# Patient Record
Sex: Female | Born: 1960 | Race: White | Hispanic: No | Marital: Married | Smoking: Never smoker
Health system: Southern US, Community
[De-identification: ages and names within clinical notes are randomized; demographics above are authoritative.]

## PROBLEM LIST (undated history)

## (undated) DIAGNOSIS — N950 Postmenopausal bleeding: Secondary | ICD-10-CM

## (undated) DIAGNOSIS — R87629 Unspecified abnormal cytological findings in specimens from vagina: Secondary | ICD-10-CM

## (undated) DIAGNOSIS — I10 Essential (primary) hypertension: Secondary | ICD-10-CM

## (undated) DIAGNOSIS — F418 Other specified anxiety disorders: Secondary | ICD-10-CM

## (undated) DIAGNOSIS — F329 Major depressive disorder, single episode, unspecified: Secondary | ICD-10-CM

## (undated) DIAGNOSIS — E785 Hyperlipidemia, unspecified: Secondary | ICD-10-CM

## (undated) DIAGNOSIS — R51 Headache: Secondary | ICD-10-CM

## (undated) DIAGNOSIS — F32A Depression, unspecified: Secondary | ICD-10-CM

## (undated) DIAGNOSIS — F439 Reaction to severe stress, unspecified: Secondary | ICD-10-CM

## (undated) DIAGNOSIS — R519 Headache, unspecified: Secondary | ICD-10-CM

## (undated) DIAGNOSIS — G43909 Migraine, unspecified, not intractable, without status migrainosus: Secondary | ICD-10-CM

## (undated) DIAGNOSIS — J302 Other seasonal allergic rhinitis: Secondary | ICD-10-CM

## (undated) DIAGNOSIS — F419 Anxiety disorder, unspecified: Secondary | ICD-10-CM

## (undated) DIAGNOSIS — Z973 Presence of spectacles and contact lenses: Secondary | ICD-10-CM

## (undated) HISTORY — DX: Unspecified abnormal cytological findings in specimens from vagina: R87.629

## (undated) HISTORY — PX: DILATION AND CURETTAGE OF UTERUS: SHX78

## (undated) HISTORY — PX: OTHER SURGICAL HISTORY: SHX169

## (undated) HISTORY — DX: Other specified anxiety disorders: F41.8

## (undated) HISTORY — DX: Headache, unspecified: R51.9

## (undated) HISTORY — PX: EYE SURGERY: SHX253

## (undated) HISTORY — PX: CHOLECYSTECTOMY: SHX55

## (undated) HISTORY — DX: Headache: R51

## (undated) HISTORY — DX: Other seasonal allergic rhinitis: J30.2

## (undated) HISTORY — DX: Reaction to severe stress, unspecified: F43.9

---

## 1997-09-27 ENCOUNTER — Other Ambulatory Visit: Admission: RE | Admit: 1997-09-27 | Discharge: 1997-09-27 | Payer: Self-pay | Admitting: Obstetrics and Gynecology

## 1998-09-29 ENCOUNTER — Other Ambulatory Visit: Admission: RE | Admit: 1998-09-29 | Discharge: 1998-09-29 | Payer: Self-pay | Admitting: Obstetrics and Gynecology

## 1999-11-28 ENCOUNTER — Other Ambulatory Visit: Admission: RE | Admit: 1999-11-28 | Discharge: 1999-11-28 | Payer: Self-pay | Admitting: Obstetrics and Gynecology

## 2000-12-06 ENCOUNTER — Other Ambulatory Visit: Admission: RE | Admit: 2000-12-06 | Discharge: 2000-12-06 | Payer: Self-pay | Admitting: Obstetrics and Gynecology

## 2002-02-12 ENCOUNTER — Other Ambulatory Visit: Admission: RE | Admit: 2002-02-12 | Discharge: 2002-02-12 | Payer: Self-pay

## 2003-03-16 ENCOUNTER — Other Ambulatory Visit: Admission: RE | Admit: 2003-03-16 | Discharge: 2003-03-16 | Payer: Self-pay | Admitting: Obstetrics and Gynecology

## 2004-03-10 ENCOUNTER — Ambulatory Visit: Payer: Self-pay | Admitting: Internal Medicine

## 2004-06-09 ENCOUNTER — Other Ambulatory Visit: Admission: RE | Admit: 2004-06-09 | Discharge: 2004-06-09 | Payer: Self-pay | Admitting: Obstetrics and Gynecology

## 2006-04-22 ENCOUNTER — Emergency Department (HOSPITAL_COMMUNITY): Admission: EM | Admit: 2006-04-22 | Discharge: 2006-04-23 | Payer: Self-pay | Admitting: Emergency Medicine

## 2006-05-01 ENCOUNTER — Ambulatory Visit: Payer: Self-pay | Admitting: Internal Medicine

## 2006-09-12 ENCOUNTER — Ambulatory Visit: Payer: Self-pay | Admitting: Critical Care Medicine

## 2006-09-12 ENCOUNTER — Ambulatory Visit: Payer: Self-pay | Admitting: Specialist

## 2006-11-04 ENCOUNTER — Ambulatory Visit: Payer: Self-pay | Admitting: Critical Care Medicine

## 2007-05-12 ENCOUNTER — Ambulatory Visit: Payer: Self-pay | Admitting: Internal Medicine

## 2007-05-15 ENCOUNTER — Ambulatory Visit: Payer: Self-pay | Admitting: Internal Medicine

## 2007-05-27 ENCOUNTER — Ambulatory Visit: Payer: Self-pay | Admitting: Critical Care Medicine

## 2007-05-27 ENCOUNTER — Ambulatory Visit: Payer: Self-pay | Admitting: Specialist

## 2008-08-03 ENCOUNTER — Encounter: Payer: Self-pay | Admitting: Internal Medicine

## 2008-11-29 ENCOUNTER — Ambulatory Visit: Payer: Self-pay | Admitting: Internal Medicine

## 2008-11-29 DIAGNOSIS — F438 Other reactions to severe stress: Secondary | ICD-10-CM | POA: Insufficient documentation

## 2008-11-29 DIAGNOSIS — F4320 Adjustment disorder, unspecified: Secondary | ICD-10-CM | POA: Insufficient documentation

## 2008-11-29 DIAGNOSIS — F4389 Other reactions to severe stress: Secondary | ICD-10-CM | POA: Insufficient documentation

## 2008-11-29 DIAGNOSIS — F341 Dysthymic disorder: Secondary | ICD-10-CM | POA: Insufficient documentation

## 2008-11-29 DIAGNOSIS — I2510 Atherosclerotic heart disease of native coronary artery without angina pectoris: Secondary | ICD-10-CM | POA: Insufficient documentation

## 2008-12-03 ENCOUNTER — Encounter: Payer: Self-pay | Admitting: Internal Medicine

## 2008-12-03 ENCOUNTER — Ambulatory Visit: Payer: Self-pay

## 2008-12-10 ENCOUNTER — Encounter: Payer: Self-pay | Admitting: Internal Medicine

## 2008-12-10 DIAGNOSIS — I6529 Occlusion and stenosis of unspecified carotid artery: Secondary | ICD-10-CM | POA: Insufficient documentation

## 2009-01-07 ENCOUNTER — Ambulatory Visit: Payer: Self-pay | Admitting: Internal Medicine

## 2009-01-10 LAB — CONVERTED CEMR LAB
BUN: 9 mg/dL (ref 6–23)
CO2: 28 meq/L (ref 19–32)
Calcium: 9 mg/dL (ref 8.4–10.5)
Chloride: 107 meq/L (ref 96–112)
Creatinine, Ser: 0.7 mg/dL (ref 0.4–1.2)
GFR calc non Af Amer: 94.62 mL/min (ref >60)
Glucose, Bld: 99 mg/dL (ref 70–99)
Potassium: 4.4 meq/L (ref 3.5–5.1)
Sodium: 140 meq/L (ref 135–145)

## 2009-01-11 ENCOUNTER — Ambulatory Visit (HOSPITAL_COMMUNITY): Admission: RE | Admit: 2009-01-11 | Discharge: 2009-01-11 | Payer: Self-pay | Admitting: Internal Medicine

## 2009-01-13 ENCOUNTER — Telehealth: Payer: Self-pay | Admitting: Internal Medicine

## 2009-01-14 ENCOUNTER — Telehealth: Payer: Self-pay | Admitting: Internal Medicine

## 2009-01-24 ENCOUNTER — Ambulatory Visit: Payer: Self-pay | Admitting: Internal Medicine

## 2009-01-24 DIAGNOSIS — E7800 Pure hypercholesterolemia, unspecified: Secondary | ICD-10-CM | POA: Insufficient documentation

## 2009-02-02 LAB — CONVERTED CEMR LAB: ALT: 31 units/L (ref 0–35)

## 2009-02-03 ENCOUNTER — Encounter: Payer: Self-pay | Admitting: Internal Medicine

## 2009-05-19 ENCOUNTER — Telehealth: Payer: Self-pay | Admitting: Internal Medicine

## 2010-01-22 ENCOUNTER — Encounter: Payer: Self-pay | Admitting: Internal Medicine

## 2010-02-02 NOTE — Progress Notes (Signed)
Summary: RETURNING CALL   Phone Note Call from Patient Call back at Home Phone [phone]   Caller: Patient Reason for Call: Talk to Doctor Summary of Call: RETURNING CALL Initial call taken by: Migdalia Dk,  January 14, 2009 3:32 PM  Follow-up for Phone Call        PT AWARE OF MRA RESULTS Follow-up by: Scherrie Bateman, LPN,  January 14, 2009 3:46 PM  Additional Follow-up for Phone Call Additional follow up Details #1::        patient contacted.  Reviewed test results. Additional Follow-up by: Sherrill Raring, MD, [facility],  January 14, 2009 4:08 PM

## 2010-02-02 NOTE — Miscellaneous (Signed)
Summary: Orders Update  Clinical Lists Changes  Orders: Added new Test order of T- * Misc. Laboratory test (99999) - Signed 

## 2010-02-02 NOTE — Progress Notes (Signed)
Summary: Question about Carotid Doppler test   Phone Note Call from Patient Call back at Home Phone [phone]   Caller: Patient Summary of Call: Pt have question about having a carotid  doppler since she had a MRI in Jan of this year. Initial call taken by: Judie Grieve,  May 19, 2009 4:56 PM  Follow-up for Phone Call        Called patient ...advised will ask Dr.Ajai Harville what the follow up should be since she had a carotid MRI done and then will call her back with information. Follow-up by: Suzan Garibaldi RN  Additional Follow-up for Phone Call Additional follow up Details #1::        patient had normal MRI of neck in January.  Does not need carotid doppler. Additional Follow-up by: Sherrill Raring, MD, [facility],  May 23, 2009 6:55 AM     Appended Document: Question about Carotid Doppler test Pt. aware of above.

## 2010-02-02 NOTE — Progress Notes (Signed)
Summary: MRI   Phone Note Call from Patient Call back at Home Phone [phone]   Caller: Patient Reason for Call: Lab or Test Results Summary of Call: request results of MRI Initial call taken by: Migdalia Dk,  January 13, 2009 1:21 PM  Follow-up for Phone Call        1:30pm 01/13/09 pt calling about results head,neck mri --results given and pt asking when she would hear from dr Aika Brzoska--advised dr Tenny Craw would contact her if any further orders--nt Follow-up by: Ledon Snare, RN,  January 13, 2009 1:32 PM     Appended Document: MRI left msg. on machine.  will call again monday.

## 2010-02-02 NOTE — Miscellaneous (Signed)
  Clinical Lists Changes  Medications: Removed medication of SIMVASTATIN 20 MG TABS (SIMVASTATIN) 1 tab once daily

## 2010-02-02 NOTE — Letter (Signed)
Summary: [facility]   Imported By: Kassie Mends 01/03/2009 08:33:15  _____________________________________________________________________  External Attachment:    Type:   Image     Comment:   External Document

## 2010-05-16 NOTE — Assessment & Plan Note (Signed)
[facility]                            CARDIOLOGY OFFICE NOTE   Stacy Watkins, Stacy Watkins                   MRN:          [id number]  DATE:05/12/2007                            DOB:          06-Nov-1960    Stacy Watkins is a 50 year old woman who I saw about a year ago.  She has a  history of chest pain back when she was seen, also dyslipidemia and  migraine headaches.   She comes in today for continued evaluation.  She denies chest pain.  She said she has been actually doing fairly well, breathing well, no  chest pain.  Under increased stress because of her parents being ill,  and her father died.   She was recently seen in [place], [place].  Lipid panel was done that  showed total cholesterol of 154, LDL of 86, HDL of 50 (up from about 39  in the past).  She had a CRP done though that was elevated at 5.8 and  she presents for continued evaluation.   Again, patient denies any chest pain.   PHYSICAL EXAMINATION:  The patient is in no distress.  Blood pressure is  121/79, pulse is 89, regular  Weight 143.  NECK:  JVP is normal.  No thyromegaly or bruits.  CARDIAC:  Regular rate and rhythm.  S1, S2, no S3.  No murmurs.  ABDOMEN:  Benign.  EXTREMITIES:  No edema.   FAMILY HISTORY:  Again significant for a father who had bypass in his  59s.   REVIEW OF SYSTEMS:  Note LDL in the past was 100.  CRP 3.62 in the past.  I do not have a repeat here.   12 lead EKG shows normal sinus rhythm at 88 beats per minute.   IMPRESSION:  Stacy Watkins is a 50 year old woman who has had an elevated C-  reactive protein.  I would like to repeat this.  I have talked to her  about her C-reactive protein level and lipid panel, and looking at a  lipo met she had sone years ago, she actually has good particles of the  larger size, or she did, the larger size fewer back in 2005.  LDL  particle number was 1050.  Again large LDL or pattern A.  I have  discussed with her the  significance of the C-reactive protein if it is  elevated, and it may be a marker for atherosclerosis with intravascular  inflammation.  One could consider empiric treatment with a statin and  let us follow up with both lipids and C-reactive protein in the future.  Alternatively, one could consider a CT scan to evaluate for plaquing,  but again, with her age and significant radiation, I would not pursue  this for now especially since she is asymptomatic.   I will be in touch with her once I have seen the ANA and CRP.  Continue  activities as tolerated for now.     Pricilla Riffle, MD, [facility]  Electronically Signed    PVR/MedQ  DD: 05/12/2007  DT: 05/12/2007  Job #: [id number]

## 2010-05-19 NOTE — Assessment & Plan Note (Signed)
[facility]                            CARDIOLOGY OFFICE NOTE   SYMANTHA, [name]                   MRN:          [id number]  DATE:05/01/2006                            DOB:          Dec 05, 1960    IDENTIFICATION:  [name] is a woman whom I saw a few years ago.  She  is about 50 now.  When I saw her last, it was more for risk  stratification.  Refer for full details.   The patient recently went into the emergency room about 10 days ago.  She was in a family argument or there were family stressors going on.  She developed some squeezing in her chest, back pain, and tingling in  both her arms.  I don't have the ER records, but she was sent home.   She denies having spells with exertion.  She says she does not get any  of the heaviness in her chest.  She has some tingling that she has had  for a long time in her back and the back of her neck.  She has not had  any workup for this.  Question she has had a fibromyalgia in the past.   Otherwise, she remains active.  No change in her ability to do things.  There are a lot of stressors at home with family and relatives.   CURRENT MEDICATIONS:  1. Atenolol 100 daily.  2. Risperidone 75 daily.  3. Zoloft 100 daily.  4. Klonopin 0.5 nightly.  5. Flonase.  6. Claritin.  7. Migraine relief, herbal.   ALLERGIES:  Seasonal.  No known medicines.   PAST MEDICAL HISTORY:  1. Migraine headaches.  2. Seasonal allergies.   SOCIAL HISTORY:  The patient is married with 5 children, some adopted.  Does not smoke.  Does not drink.   FAMILY HISTORY:  Father is 101, had CABG x4 in his 6s.  Mother has a  history of CHF, 1 sister died at age 42.  I had reviewed this several  years ago.  It sounds like she may have had a respiratory arrest.   REVIEW OF SYSTEMS:  All systems have been reviewed.  Negative to the  above problem, except as noted above.   PHYSICAL EXAM:  On exam, the patient is in no acute  distress.  Blood pressure 118/74, pulse 88 and regular, weight 144.  HEENT:  Normocephalic, atraumatic.  EOMI.  PERRLA.  Nares clear.  Throat  clear.  NECK:  JVP is normal.  No thyromegaly or bruit.  LUNGS:  Clear to auscultation without rales or wheezes.  CARDIAC:  Regular rate and rhythm.  S1, S2.  No S3, S4, murmurs or  clicks.  ABDOMEN:  Supple. nontender.  No masses.  Normal bowel sounds.  No  hepatomegaly.  EXTREMITIES:  Good distal pulses throughout.  No lower extremity edema.  Feet warm.   A 12-lead EKG shows a normal sinus rhythm at 89 beats per minute.   IMPRESSION:  1. Chest pressure.  I am not sure what the episode represented.  She      was under increased  stress.  I am concerned that she may have had      some hyperventilation exacerbating this with the numbness, tingling      in her arms.  Along with neck discomfort.  I reassured her some.  I      am, again, not convinced that this represents an ischemic spell.      She is due to have blood work checked tomorrow.  I will follow up      on this.  For now, continue activities as she is.  I would not      schedule any further testing for now, given that I consider her      more of a low-risk cardiac potential.  2. In relation to her musculoskeletal complaints, particularly of her      back, I have given her the names for Dr. Claudette Laws and Dr.      Ashok Cordia for possible referral to PM&R.  3. Migraines, medicines as noted.   I will set follow up p.r.n.     Pricilla Riffle, MD, [facility]  Electronically Signed    PVR/MedQ  DD: 05/01/2006  DT: 05/01/2006  Job #: [id number]   cc:   Holley Bouche, M.D.

## 2012-10-01 ENCOUNTER — Other Ambulatory Visit (HOSPITAL_COMMUNITY): Payer: Self-pay | Admitting: Internal Medicine

## 2012-10-01 DIAGNOSIS — R109 Unspecified abdominal pain: Secondary | ICD-10-CM

## 2012-10-03 ENCOUNTER — Other Ambulatory Visit (HOSPITAL_COMMUNITY): Payer: Self-pay | Admitting: Internal Medicine

## 2012-10-03 DIAGNOSIS — R109 Unspecified abdominal pain: Secondary | ICD-10-CM

## 2012-10-20 ENCOUNTER — Encounter (HOSPITAL_COMMUNITY)
Admission: RE | Admit: 2012-10-20 | Discharge: 2012-10-20 | Disposition: A | Discharge status: Home / Self Care | Payer: BC Managed Care – PPO | Source: Ambulatory Visit | Attending: Internal Medicine | Admitting: Internal Medicine

## 2012-10-20 DIAGNOSIS — R109 Unspecified abdominal pain: Secondary | ICD-10-CM | POA: Insufficient documentation

## 2012-10-20 MED ORDER — TECHNETIUM TC 99M MEBROFENIN IV KIT
5.5000 | PACK | Freq: Once | INTRAVENOUS | Status: AC | PRN
  Administered 2012-10-20: 5.5 via INTRAVENOUS

## 2012-10-20 MED ORDER — SINCALIDE 5 MCG IJ SOLR: 0.0200 ug/kg | Freq: Once | INTRAMUSCULAR | Status: DC

## 2013-04-21 ENCOUNTER — Ambulatory Visit (INDEPENDENT_AMBULATORY_CARE_PROVIDER_SITE_OTHER): Payer: BC Managed Care – PPO | Admitting: Cardiology

## 2013-04-21 ENCOUNTER — Encounter: Payer: Self-pay | Admitting: *Deleted

## 2013-04-21 ENCOUNTER — Encounter: Payer: Self-pay | Admitting: Cardiology

## 2013-04-21 VITALS — BP 130/74 | HR 91 | Ht 66.0 in | Wt 159.0 lb

## 2013-04-21 DIAGNOSIS — R0789 Other chest pain: Secondary | ICD-10-CM

## 2013-04-21 DIAGNOSIS — R51 Headache: Secondary | ICD-10-CM

## 2013-04-21 DIAGNOSIS — R519 Headache, unspecified: Secondary | ICD-10-CM | POA: Insufficient documentation

## 2013-04-21 DIAGNOSIS — E7800 Pure hypercholesterolemia, unspecified: Secondary | ICD-10-CM

## 2013-04-21 DIAGNOSIS — J302 Other seasonal allergic rhinitis: Secondary | ICD-10-CM | POA: Insufficient documentation

## 2013-04-21 NOTE — Progress Notes (Signed)
     HPI: 53 year old female for evaluation of hyperlipidemia. Carotid Dopplers in 2010 showed 60-79% bilateral stenosis. MRA of the neck however showed no carotid stenosis. Patient has a strong family history of coronary disease and presented for evaluation. She denies dyspnea on exertion, orthopnea, PND, pedal edema, syncope. She occasionally has chest tightness it is not exertional. It does not radiate and there are no associated symptoms. Lasts 10 minutes and resolves.  Current Outpatient Prescriptions  Medication Sig Dispense Refill  . aspirin 81 MG tablet Take 81 mg by mouth daily.      Marland Kitchen. atenolol (TENORMIN) 100 MG tablet Take 100 mg by mouth daily.      . Calcium Carbonate-Vitamin D (CALCIUM + D PO) Take by mouth.      . cetirizine (ZYRTEC) 10 MG tablet Take 10 mg by mouth daily.      Marland Kitchen. desipramine (NOPRAMIN) 100 MG tablet Take 100 mg by mouth at bedtime.      . MULTIPLE VITAMIN PO Take by mouth.      . Naproxen Sodium (ALEVE PO) Take by mouth as needed.      . rizatriptan (MAXALT) 10 MG tablet Take 10 mg by mouth as needed for migraine. May repeat in 2 hours if needed      . zolpidem (AMBIEN) 10 MG tablet Take 10 mg by mouth at bedtime as needed for sleep.       No current facility-administered medications for this visit.    Not on File  Past Medical History  Diagnosis Date  . Headache   . Seasonal allergies   . Situational stress   . Depression with anxiety     Past Surgical History  Procedure Laterality Date  . Eye surgery    . Cesarean section      History   Social History  . Marital Status: Married    Spouse Name: N/A    Number of Children: 5  . Years of Education: N/A   Occupational History  . Not on file.   Social History Main Topics  . Smoking status: Never Smoker   . Smokeless tobacco: Not on file  . Alcohol Use: No  . Drug Use: No  . Sexual Activity: Not on file   Other Topics Concern  . Not on file   Social History Narrative  . No narrative  on file    Family History  Problem Relation Age of Onset  . Heart failure Mother   . Colon cancer Father   . CAD Sister     Died at age 53 of MI  . CAD Father     MI in his 4250s    ROS: no fevers or chills, productive cough, hemoptysis, dysphasia, odynophagia, melena, hematochezia, dysuria, hematuria, rash, seizure activity, orthopnea, PND, pedal edema, claudication. Remaining systems are negative.  Physical Exam:   Blood pressure 130/74, pulse 91, height 5\' 6"  (1.676 m), weight 159 lb (72.122 kg).  General:  Well developed/well nourished in NAD Skin warm/dry Patient not depressed No peripheral clubbing Back-normal HEENT-normal/normal eyelids Neck supple/normal carotid upstroke bilaterally; no bruits; no JVD; no thyromegaly chest - CTA/ normal expansion CV - RRR/normal S1 and S2; no murmurs, rubs or gallops;  PMI nondisplaced Abdomen -NT/ND, no HSM, no mass, + bowel sounds, no bruit 2+ femoral pulses, no bruits Ext-no edema, chords, 2+ DP Neuro-grossly nonfocal  ECG Sinus rhythm at a rate of 91. Nonspecific ST changes.

## 2013-04-21 NOTE — Assessment & Plan Note (Signed)
Plan check lipids and C-reactive protein.

## 2013-04-21 NOTE — Assessment & Plan Note (Signed)
Symptoms atypical but strong family history. Plan stress echocardiogram for risk stratification. If normal I have encouraged exercise and diet.

## 2013-04-21 NOTE — Patient Instructions (Signed)
Your physician recommends that you schedule a follow-up appointment in: AS NEEDED PENDING TEST RESULTS  Your physician has requested that you have a stress echocardiogram. For further information please visit [email or web address]. Please follow instruction sheet as given.   Your physician recommends that you return for lab work FASTING WITH STRESS TEST

## 2013-04-23 ENCOUNTER — Other Ambulatory Visit (INDEPENDENT_AMBULATORY_CARE_PROVIDER_SITE_OTHER): Payer: BC Managed Care – PPO

## 2013-04-23 DIAGNOSIS — R0789 Other chest pain: Secondary | ICD-10-CM

## 2013-04-23 LAB — C-REACTIVE PROTEIN: CRP: <0.5 mg/dL (ref 0.5–20.0)

## 2013-04-23 LAB — LIPID PANEL
Cholesterol: 134 mg/dL (ref 0–200)
HDL: 43.2 mg/dL (ref >39.00)
LDL Cholesterol: 73 mg/dL (ref 0–99)
Total CHOL/HDL Ratio: 3
Triglycerides: 89 mg/dL (ref 0.0–149.0)
VLDL: 17.8 mg/dL (ref 0.0–40.0)

## 2013-05-07 ENCOUNTER — Other Ambulatory Visit (HOSPITAL_COMMUNITY): Payer: BC Managed Care – PPO

## 2013-05-07 ENCOUNTER — Other Ambulatory Visit: Payer: BC Managed Care – PPO

## 2013-06-05 ENCOUNTER — Other Ambulatory Visit (HOSPITAL_COMMUNITY): Payer: BC Managed Care – PPO

## 2013-06-26 ENCOUNTER — Ambulatory Visit (HOSPITAL_BASED_OUTPATIENT_CLINIC_OR_DEPARTMENT_OTHER): Payer: BC Managed Care – PPO

## 2013-06-26 ENCOUNTER — Ambulatory Visit (HOSPITAL_COMMUNITY): Payer: BC Managed Care – PPO | Attending: Cardiology | Admitting: Radiology

## 2013-06-26 DIAGNOSIS — R0789 Other chest pain: Secondary | ICD-10-CM

## 2013-06-26 DIAGNOSIS — Z8249 Family history of ischemic heart disease and other diseases of the circulatory system: Secondary | ICD-10-CM | POA: Insufficient documentation

## 2013-06-26 DIAGNOSIS — I679 Cerebrovascular disease, unspecified: Secondary | ICD-10-CM | POA: Insufficient documentation

## 2013-06-26 DIAGNOSIS — R0989 Other specified symptoms and signs involving the circulatory and respiratory systems: Secondary | ICD-10-CM

## 2013-06-26 NOTE — Progress Notes (Signed)
Stress Echocardiogram performed.  

## 2013-06-30 ENCOUNTER — Other Ambulatory Visit (HOSPITAL_COMMUNITY): Payer: BC Managed Care – PPO

## 2014-01-01 HISTORY — PX: CARPAL TUNNEL RELEASE: SHX101

## 2014-09-17 ENCOUNTER — Other Ambulatory Visit: Payer: Self-pay | Admitting: Orthopedic Surgery

## 2014-10-01 ENCOUNTER — Encounter (HOSPITAL_BASED_OUTPATIENT_CLINIC_OR_DEPARTMENT_OTHER): Payer: Self-pay | Admitting: *Deleted

## 2014-10-08 NOTE — H&P (Signed)
Stacy Watkins is an 54 y.o. female.   CC / Reason for Visit: Bilateral hand followup HPI: This patient returns now greater than 3 months following above procedure, doing well in that side.  She still has a little soreness in the palm and bearing weight upon the hand.  She reports her left-sided numbness and tingling is actually doing better as well and she is now ready to proceed surgically with that.  Past Medical History  Diagnosis Date  . Headache   . Seasonal allergies   . Situational stress   . Depression with anxiety   . Anxiety   . Depression     Past Surgical History  Procedure Laterality Date  . Eye surgery    . Cesarean section    . Dilation and curettage of uterus    . Carpal tunnel release  2016    Family History  Problem Relation Age of Onset  . Heart failure Mother   . Colon cancer Father   . CAD Sister     Died at age 40 of MI  . CAD Father     MI in his 65s   Social History:  reports that she has never smoked. She has never used smokeless tobacco. She reports that she does not drink alcohol or use illicit drugs.  Allergies: No Known Allergies  No prescriptions prior to admission    No results found for this or any previous visit (from the past 48 hour(s)). No results found.  Review of Systems  All other systems reviewed and are negative.   Height  (1.676 m), weight 65.772 kg (145 lb). Physical Exam  Constitutional:  WD, WN, NAD HEENT:  NCAT, EOMI Neuro/Psych:  Alert & oriented to person, place, and time; appropriate mood & affect Lymphatic: No generalized UE edema or lymphadenopathy Extremities / MSK:  Both UE are normal with respect to appearance, ranges of motion, joint stability, muscle strength/tone, sensation, & perfusion except as otherwise noted:  On the right side, the proximal incision is much more smooth and flat, the palmar incision is still a little firm and lumpy.  She has full digital motion, can detect 2.8 3 monofilament  across all the digits with grip strength in position 2:65/65.  On the left, + tinel's and phalens.   Labs / Xrays:  No radiographic studies obtained today.  Assessment:  3+ months following above procedure doing well, with the left side still amenable to continued nonoperative management  Plan: She will call to arrange for the proper timing when she wishes to proceed on the left.  Patriciaann Rabanal A. 10/08/2014, 12:50 PM

## 2014-10-11 ENCOUNTER — Ambulatory Visit (HOSPITAL_BASED_OUTPATIENT_CLINIC_OR_DEPARTMENT_OTHER)
Admission: RE | Admit: 2014-10-11 | Discharge: 2014-10-11 | Disposition: A | Discharge status: Home / Self Care | Payer: BLUE CROSS/BLUE SHIELD | Source: Ambulatory Visit | Attending: Orthopedic Surgery | Admitting: Orthopedic Surgery

## 2014-10-11 ENCOUNTER — Ambulatory Visit (HOSPITAL_BASED_OUTPATIENT_CLINIC_OR_DEPARTMENT_OTHER): Payer: BLUE CROSS/BLUE SHIELD | Admitting: Certified Registered"

## 2014-10-11 ENCOUNTER — Encounter (HOSPITAL_BASED_OUTPATIENT_CLINIC_OR_DEPARTMENT_OTHER)
Admission: RE | Disposition: A | Discharge status: Home / Self Care | Payer: Self-pay | Source: Ambulatory Visit | Attending: Orthopedic Surgery

## 2014-10-11 ENCOUNTER — Encounter (HOSPITAL_BASED_OUTPATIENT_CLINIC_OR_DEPARTMENT_OTHER): Payer: Self-pay | Admitting: Certified Registered"

## 2014-10-11 DIAGNOSIS — G5602 Carpal tunnel syndrome, left upper limb: Secondary | ICD-10-CM | POA: Insufficient documentation

## 2014-10-11 HISTORY — DX: Anxiety disorder, unspecified: F41.9

## 2014-10-11 HISTORY — PX: CARPAL TUNNEL RELEASE: SHX101

## 2014-10-11 HISTORY — DX: Major depressive disorder, single episode, unspecified: F32.9

## 2014-10-11 HISTORY — DX: Depression, unspecified: F32.A

## 2014-10-11 SURGERY — RELEASE, CARPAL TUNNEL, ENDOSCOPIC
Anesthesia: Monitor Anesthesia Care | Site: Wrist | Laterality: Left

## 2014-10-11 MED ORDER — LACTATED RINGERS IV SOLN
INTRAVENOUS | Status: DC
  Administered 2014-10-11 (×2): via INTRAVENOUS

## 2014-10-11 MED ORDER — LIDOCAINE HCL (CARDIAC) 20 MG/ML IV SOLN
INTRAVENOUS | Status: AC
  Filled 2014-10-11: qty 5

## 2014-10-11 MED ORDER — SCOPOLAMINE 1 MG/3DAYS TD PT72: 1.0000 | MEDICATED_PATCH | Freq: Once | TRANSDERMAL | Status: DC | PRN

## 2014-10-11 MED ORDER — ONDANSETRON HCL 4 MG/2ML IJ SOLN
INTRAMUSCULAR | Status: AC
  Filled 2014-10-11: qty 2

## 2014-10-11 MED ORDER — CEFAZOLIN SODIUM-DEXTROSE 2-3 GM-% IV SOLR
2.0000 g | INTRAVENOUS | Status: AC
  Administered 2014-10-11: 2 g via INTRAVENOUS

## 2014-10-11 MED ORDER — ONDANSETRON HCL 4 MG/2ML IJ SOLN
INTRAMUSCULAR | Status: DC | PRN
Start: 2014-10-11 — End: 2014-10-11
  Administered 2014-10-11: 4 mg via INTRAVENOUS

## 2014-10-11 MED ORDER — PROMETHAZINE HCL 25 MG/ML IJ SOLN: 6.2500 mg | INTRAMUSCULAR | Status: DC | PRN

## 2014-10-11 MED ORDER — BUPIVACAINE-EPINEPHRINE (PF) 0.5% -1:200000 IJ SOLN
INTRAMUSCULAR | Status: DC | PRN
  Administered 2014-10-11: 2.5 mL

## 2014-10-11 MED ORDER — MIDAZOLAM HCL 2 MG/2ML IJ SOLN
INTRAMUSCULAR | Status: AC
  Filled 2014-10-11: qty 4

## 2014-10-11 MED ORDER — BUPIVACAINE-EPINEPHRINE (PF) 0.5% -1:200000 IJ SOLN
INTRAMUSCULAR | Status: AC
  Filled 2014-10-11: qty 60

## 2014-10-11 MED ORDER — PROPOFOL 10 MG/ML IV BOLUS
INTRAVENOUS | Status: AC
  Filled 2014-10-11: qty 20

## 2014-10-11 MED ORDER — CEFAZOLIN SODIUM-DEXTROSE 2-3 GM-% IV SOLR
INTRAVENOUS | Status: AC
  Filled 2014-10-11: qty 50

## 2014-10-11 MED ORDER — FENTANYL CITRATE (PF) 100 MCG/2ML IJ SOLN
INTRAMUSCULAR | Status: AC
  Filled 2014-10-11: qty 4

## 2014-10-11 MED ORDER — MIDAZOLAM HCL 5 MG/5ML IJ SOLN
INTRAMUSCULAR | Status: DC | PRN
  Administered 2014-10-11: 2 mg via INTRAVENOUS

## 2014-10-11 MED ORDER — FENTANYL CITRATE (PF) 100 MCG/2ML IJ SOLN
INTRAMUSCULAR | Status: DC | PRN
  Administered 2014-10-11: 50 ug via INTRAVENOUS

## 2014-10-11 MED ORDER — LIDOCAINE HCL 2 % IJ SOLN
INTRAMUSCULAR | Status: AC
  Filled 2014-10-11: qty 20

## 2014-10-11 MED ORDER — HYDROMORPHONE HCL 1 MG/ML IJ SOLN: 0.2500 mg | INTRAMUSCULAR | Status: DC | PRN

## 2014-10-11 MED ORDER — LACTATED RINGERS IV SOLN
INTRAVENOUS | Status: DC
Start: 2014-10-11 — End: 2014-10-11

## 2014-10-11 MED ORDER — MEPERIDINE HCL 25 MG/ML IJ SOLN: 6.2500 mg | INTRAMUSCULAR | Status: DC | PRN

## 2014-10-11 MED ORDER — PROPOFOL 10 MG/ML IV BOLUS
INTRAVENOUS | Status: DC | PRN
  Administered 2014-10-11: 30 mg via INTRAVENOUS
  Administered 2014-10-11: 10 mg via INTRAVENOUS

## 2014-10-11 MED ORDER — GLYCOPYRROLATE 0.2 MG/ML IJ SOLN: 0.2000 mg | Freq: Once | INTRAMUSCULAR | Status: DC | PRN

## 2014-10-11 MED ORDER — LIDOCAINE HCL 2 % IJ SOLN
INTRAMUSCULAR | Status: DC | PRN
  Administered 2014-10-11: 2.5 mL

## 2014-10-11 MED ORDER — LIDOCAINE HCL (CARDIAC) 20 MG/ML IV SOLN
INTRAVENOUS | Status: DC | PRN
  Administered 2014-10-11: 30 mg via INTRAVENOUS

## 2014-10-11 SURGICAL SUPPLY — 40 items
APPLICATOR COTTON TIP 6IN STRL (MISCELLANEOUS) ×2 IMPLANT
BLADE HOOK ENDO STRL (BLADE) ×2 IMPLANT
BLADE SURG 15 STRL LF DISP TIS (BLADE) ×1 IMPLANT
BLADE SURG 15 STRL SS (BLADE) ×2
BLADE TRIANGLE EPF/EGR ENDO (BLADE) ×2 IMPLANT
BNDG COHESIVE 4X5 TAN STRL (GAUZE/BANDAGES/DRESSINGS) ×2 IMPLANT
BNDG ESMARK 4X9 LF (GAUZE/BANDAGES/DRESSINGS) ×2 IMPLANT
BNDG GAUZE ELAST 4 BULKY (GAUZE/BANDAGES/DRESSINGS) ×2 IMPLANT
CHLORAPREP W/TINT 26ML (MISCELLANEOUS) ×2 IMPLANT
CORDS BIPOLAR (ELECTRODE) IMPLANT
COVER BACK TABLE 60X90IN (DRAPES) ×2 IMPLANT
COVER MAYO STAND STRL (DRAPES) ×2 IMPLANT
CUFF TOURNIQUET SINGLE 18IN (TOURNIQUET CUFF) IMPLANT
DRAIN TLS ROUND 10FR (DRAIN) ×2 IMPLANT
DRAPE EXTREMITY T 121X128X90 (DRAPE) ×2 IMPLANT
DRAPE SURG 17X23 STRL (DRAPES) ×2 IMPLANT
DRSG EMULSION OIL 3X3 NADH (GAUZE/BANDAGES/DRESSINGS) ×2 IMPLANT
GLOVE BIO SURGEON STRL SZ7.5 (GLOVE) ×2 IMPLANT
GLOVE BIOGEL PI IND STRL 7.0 (GLOVE) ×1 IMPLANT
GLOVE BIOGEL PI IND STRL 8 (GLOVE) ×1 IMPLANT
GLOVE BIOGEL PI INDICATOR 7.0 (GLOVE) ×1
GLOVE BIOGEL PI INDICATOR 8 (GLOVE) ×1
GLOVE ECLIPSE 6.5 STRL STRAW (GLOVE) ×2 IMPLANT
GOWN STRL REUS W/ TWL LRG LVL3 (GOWN DISPOSABLE) ×2 IMPLANT
GOWN STRL REUS W/TWL LRG LVL3 (GOWN DISPOSABLE) ×2
GOWN STRL REUS W/TWL XL LVL3 (GOWN DISPOSABLE) ×2 IMPLANT
NEEDLE HYPO 25X1 1.5 SAFETY (NEEDLE) IMPLANT
NS IRRIG 1000ML POUR BTL (IV SOLUTION) ×2 IMPLANT
PACK BASIN DAY SURGERY FS (CUSTOM PROCEDURE TRAY) ×2 IMPLANT
PADDING CAST ABS 4INX4YD NS (CAST SUPPLIES)
PADDING CAST ABS COTTON 4X4 ST (CAST SUPPLIES) IMPLANT
SPONGE GAUZE 4X4 12PLY STER LF (GAUZE/BANDAGES/DRESSINGS) ×2 IMPLANT
STOCKINETTE 6  STRL (DRAPES) ×1
STOCKINETTE 6 STRL (DRAPES) ×1 IMPLANT
SUT VICRYL RAPIDE 4-0 (SUTURE) ×2 IMPLANT
SYR BULB 3OZ (MISCELLANEOUS) ×2 IMPLANT
SYRINGE 10CC LL (SYRINGE) IMPLANT
TOWEL OR 17X24 6PK STRL BLUE (TOWEL DISPOSABLE) ×4 IMPLANT
TOWEL OR NON WOVEN STRL DISP B (DISPOSABLE) ×2 IMPLANT
UNDERPAD 30X30 (UNDERPADS AND DIAPERS) ×2 IMPLANT

## 2014-10-11 NOTE — Discharge Instructions (Addendum)
Discharge Instructions ° ° °You have a light dressing on your hand.  °You may begin gentle motion of your fingers and hand immediately, but you should not do any heavy lifting or gripping.  Elevate your hand to reduce pain & swelling of the digits.  Ice over the operative site may be helpful to reduce pain & swelling.  DO NOT USE HEAT. °Pain medicine has been prescribed for you.  °Use your medicine as needed over the first 48 hours, and then you can begin to taper your use. You may use Tylenol in place of your prescribed pain medication, but not IN ADDITION to it. °Leave the dressing in place until the third day after your surgery and then remove it, leaving it open to air.  °After the bandage has been removed you may shower, but do not soak the incision.  °You may drive a car when you are off of prescription pain medications and can safely control your vehicle with both hands. °We will address whether therapy will be required or not when you return to the office. °You may have already made your follow-up appointment when we completed your preop visit.  If not, please call our office today or the next business day to make your return appointment for 10-15 days after surgery. ° ° °Please call [phone] during normal business hours or [phone] after hours for any problems. Including the following: ° °- excessive redness of the incisions °- drainage for more than 4 days °- fever of more than 101.5 F ° °*Please note that pain medications will not be refilled after hours or on weekends. ° ° °[facility] Drain Instructions °You have a drain tube in place to help limit the amount of blood that collects under your skin in the early post-operative period.  The amount it drains will vary from person-to-person and is dependent upon many factors.  You will have 1-2 extra drain tubes sent with you from the facility.  You should change the tube according to these instructions below when the tube is about half full. ° °BE SURE TO  SLIDE THE CLAMP TO THE CLOSED POSITION BEFORE CHANGING THE TUBE.   ° °RE-OPEN THE CLAMP ONCE THE GLASS EVACUATION TUBE HAS BEEN CHANGED. ° °24 hours after surgery the amount of drainage will likely be negligible and it will be time to remove the tube and discard it.  Simply remove the tape that secures the flexible plastic drainage tube to the bandage and briskly pull the tube straight out.  You will likely feel a little discomfort during this process, but the tube should slide out as it is not sewn or otherwise secured directly to your body.  It is merely held in place by the bandage itself.  °                           ° °If you are not clear about when your first post-op appointment is scheduled, please call the office on the next business day to inquire about it.  Please also call the office if you have any other questions ([phone]).   ° ° ° °Post Anesthesia Home Care Instructions ° °Activity: °Get plenty of rest for the remainder of the day. A responsible adult should stay with you for 24 hours following the procedure.  °For the next 24 hours, DO NOT: °-Drive a car °-Operate machinery °-Drink alcoholic beverages °-Take any medication unless instructed by your physician °-Make any legal   decisions or sign important papers. ° °Meals: °Start with liquid foods such as gelatin or soup. Progress to regular foods as tolerated. Avoid greasy, spicy, heavy foods. If nausea and/or vomiting occur, drink only clear liquids until the nausea and/or vomiting subsides. Call your physician if vomiting continues. ° °Special Instructions/Symptoms: °Your throat may feel dry or sore from the anesthesia or the breathing tube placed in your throat during surgery. If this causes discomfort, gargle with warm salt water. The discomfort should disappear within 24 hours. ° °If you had a scopolamine patch placed behind your ear for the management of post- operative nausea and/or vomiting: ° °1. The medication in the patch is effective  for 72 hours, after which it should be removed.  Wrap patch in a tissue and discard in the trash. Wash hands thoroughly with soap and water. °2. You may remove the patch earlier than 72 hours if you experience unpleasant side effects which may include dry mouth, dizziness or visual disturbances. °3. Avoid touching the patch. Wash your hands with soap and water after contact with the patch. °  ° °

## 2014-10-11 NOTE — Interval H&P Note (Signed)
History and Physical Interval Note:  10/11/2014 11:21 AM  Stacy Watkins  has presented today for surgery, with the diagnosis of LEFT CARPAL TUNNEL SYNDROME  The various methods of treatment have been discussed with the patient and family. After consideration of risks, benefits and other options for treatment, the patient has consented to  Procedure(s): LEFT  ENDOSCOPIC CARPAL TUNNEL RELEASE (Left) as a surgical intervention .  The patient's history has been reviewed, patient examined, no change in status, stable for surgery.  I have reviewed the patient's chart and labs.  Questions were answered to the patient's satisfaction.     Stacy Watkins A.

## 2014-10-11 NOTE — Anesthesia Postprocedure Evaluation (Signed)
Anesthesia Post Note  Patient: Stacy Watkins  Procedure(s) Performed: Procedure(s) (LRB): LEFT  ENDOSCOPIC CARPAL TUNNEL RELEASE (Left)  Anesthesia type: MAC  Patient location: PACU  Post pain: Pain level controlled  Post assessment: Post-op Vital signs reviewed  Last Vitals: BP 131/74 mmHg  Pulse 68  Temp(Src) 36.6 C (Oral)  Resp 16  Ht  (1.676 m)  Wt 141 lb 9.6 oz (64.229 kg)  BMI 22.87 kg/m2  SpO2 100%  Post vital signs: Reviewed  Level of consciousness: awake  Complications: No apparent anesthesia complications

## 2014-10-11 NOTE — Anesthesia Preprocedure Evaluation (Signed)
Anesthesia Evaluation  Patient identified by MRN, date of birth, ID band Patient awake    Reviewed: Allergy & Precautions, NPO status , Patient's Chart, lab work & pertinent test results  Airway Mallampati: II  TM Distance: >3 FB Neck ROM: Full    Dental no notable dental hx. (+) Upper Dentures, Poor Dentition   Pulmonary neg pulmonary ROS,    Pulmonary exam normal breath sounds clear to auscultation       Cardiovascular Pt. on home beta blockers + Peripheral Vascular Disease  Normal cardiovascular exam Rhythm:Regular Rate:Normal     Neuro/Psych  Headaches, PSYCHIATRIC DISORDERS Anxiety Depression negative neurological ROS     GI/Hepatic negative GI ROS, Neg liver ROS,   Endo/Other  negative endocrine ROS  Renal/GU negative Renal ROS     Musculoskeletal negative musculoskeletal ROS (+)   Abdominal   Peds  Hematology negative hematology ROS (+)   Anesthesia Other Findings   Reproductive/Obstetrics negative OB ROS                             Anesthesia Physical Anesthesia Plan  ASA: II  Anesthesia Plan: MAC   Post-op Pain Management:    Induction: Intravenous  Airway Management Planned:   Additional Equipment:   Intra-op Plan:   Post-operative Plan:   Informed Consent: I have reviewed the patients History and Physical, chart, labs and discussed the procedure including the risks, benefits and alternatives for the proposed anesthesia with the patient or authorized representative who has indicated his/her understanding and acceptance.   Dental advisory given  Plan Discussed with: CRNA  Anesthesia Plan Comments:         Anesthesia Quick Evaluation

## 2014-10-11 NOTE — Anesthesia Procedure Notes (Signed)
Procedure Name: MAC Date/Time: 10/11/2014 11:34 AM Performed by: Aaleah Hirsch D Pre-anesthesia Checklist: Patient identified, Emergency Drugs available, Suction available, Patient being monitored and Timeout performed Patient Re-evaluated:Patient Re-evaluated prior to inductionOxygen Delivery Method: Simple face mask

## 2014-10-11 NOTE — Op Note (Signed)
10/11/2014  9:15 AM  PATIENT:  Stacy Watkins  54 y.o. female  PRE-OPERATIVE DIAGNOSIS:  LEFT CARPAL TUNNEL SYNDROME  POST-OPERATIVE DIAGNOSIS:  Same  PROCEDURE:  Procedure(s): LEFT  ENDOSCOPIC CARPAL TUNNEL RELEASE  SURGEON:  Surgeon(s): Mack Hook, MD  PHYSICIAN ASSISTANT: Danielle Rankin, OPA-C  ANESTHESIA:  Local / MAC  SPECIMENS:  None  DRAINS:   TLS x 1  EBL:  less than 50 mL  PREOPERATIVE INDICATIONS:  Stacy Watkins is a  54 y.o. female with a diagnosis of LEFT CARPAL TUNNEL SYNDROME who failed conservative measures and elected for surgical management.    The risks benefits and alternatives were discussed with the patient preoperatively including but not limited to the risks of infection, bleeding, nerve injury, cardiopulmonary complications, the need for revision surgery, among others, and the patient verbalized understanding and consented to proceed.  OPERATIVE IMPLANTS: None  OPERATIVE FINDINGS: Tight carpal tunnel, acceptably decompressed following release  OPERATIVE PROCEDURE:  After receiving prophylactic antibiotics, the patient was escorted to the operative theatre and placed in a supine position.   A surgical "time-out" was performed during which the planned procedure, proposed operative site, and the correct patient identity were compared to the operative consent and agreement confirmed by the circulating nurse according to current facility policy.  Following application of a tourniquet to the operative extremity, the planned incisions were marked and anesthetized with a mixture of lidocaine & bupivicaine containing epinephrine.  The exposed skin was prepped with Chloraprep and draped in the usual sterile fashion.  The limb was exsanguinated with an Esmarch bandage and the tourniquet inflated to approximately higher than systolic BP.   The incisions were made sharply. Subcutaneous tissues were dissected with blunt and spreading dissection. At the  proximal incision, the deep forearm fascia was split in line with the skin incision the distal edge grasped with a hemostat. At the mid palmar incision, the palmar fascia was split in line with the skin incision, revealing the underlying superficial palmar arch which was visualized with loupe assisted magnification. The synovial reflector was then introduced into the proximal incision and passed through the carpal canal, uses to reflect synovium from the deep surface of the transverse carpal ligament. It was removed and replaced with the slotted cannula and blunt obturator, passing from proximal to distal and exiting the distal wound superficial to the superficial palmar arch. The obturator was removed and the camera inserted. Visualization of the ligament was acceptable. The triangle shape blade was then inserted distally, advanced to the midportion of the ligament, and there used to create a perforation in the ligament. This instrument was removed and the hooked nstrument was inserted. It was placed into the perforation in the ligament and withdrawn distally, completing transection of the distal half of the ligament. The camera was then removed and placed into the distal end of the cannula. The hooked instrument was placed into the proximal end and advanced facility to be placed into the apex of the V. which had been formed to the distal hemi-transection of the ligament. It was withdrawn proximally, completing transection of the ligament. The adequacy of the release was judged with the scope and the instruments used as a probe. All of the endoscopic instruments are removed and the adequacy of release was again judged from the proximal incision perspective with direct loupe assisted visualization. In addition, the proximal forearm fascia was split for 2 inches proximal to the proximal incision under direct visualization using a sliding scissor technique.  The tourniquet was released, the wound copiously irrigated. A  TLS drain was placed to lie alongside the nerve exiting its own skin hole distally made with some sharp trocar. The skin was then closed with 4-0 Vicryl Rapide interrupted sutures. A light dressing was applied and the balance of 10 mL of the initial anesthetic mixture was placed down the drain, and the drain was clamped to be unclamped in the recovery room.  DISPOSITION:  The patient will be discharged home today, returning in 10-15 days for re-assessment.

## 2014-10-11 NOTE — Interval H&P Note (Signed)
History and Physical Interval Note:  10/11/2014 9:15 AM  Stacy Watkins  has presented today for surgery, with the diagnosis of LEFT CARPAL TUNNEL SYNDROME  The various methods of treatment have been discussed with the patient and family. After consideration of risks, benefits and other options for treatment, the patient has consented to  Procedure(s): LEFT  ENDOSCOPIC CARPAL TUNNEL RELEASE (Left) as a surgical intervention .  The patient's history has been reviewed, patient examined, no change in status, stable for surgery.  I have reviewed the patient's chart and labs.  Questions were answered to the patient's satisfaction.     [name]

## 2014-10-11 NOTE — Transfer of Care (Signed)
Immediate Anesthesia Transfer of Care Note  Patient: Stacy Watkins  Procedure(s) Performed: Procedure(s): LEFT  ENDOSCOPIC CARPAL TUNNEL RELEASE (Left)  Patient Location: PACU  Anesthesia Type:MAC  Level of Consciousness: awake, alert , oriented and patient cooperative  Airway & Oxygen Therapy: Patient Spontanous Breathing and Patient connected to face mask oxygen  Post-op Assessment: Report given to RN and Post -op Vital signs reviewed and stable  Post vital signs: Reviewed and stable  Last Vitals:  Filed Vitals:   10/11/14 1048  BP: 126/67  Pulse: 64  Temp: 36.6 C  Resp: 20    Complications: No apparent anesthesia complications

## 2014-10-12 ENCOUNTER — Encounter (HOSPITAL_BASED_OUTPATIENT_CLINIC_OR_DEPARTMENT_OTHER): Payer: Self-pay | Admitting: Orthopedic Surgery

## 2015-01-02 HISTORY — PX: CHOLECYSTECTOMY: SHX55

## 2016-01-17 DIAGNOSIS — H6692 Otitis media, unspecified, left ear: Secondary | ICD-10-CM | POA: Diagnosis not present

## 2016-02-01 DIAGNOSIS — J3089 Other allergic rhinitis: Secondary | ICD-10-CM | POA: Diagnosis not present

## 2016-02-01 DIAGNOSIS — J3081 Allergic rhinitis due to animal (cat) (dog) hair and dander: Secondary | ICD-10-CM | POA: Diagnosis not present

## 2016-02-01 DIAGNOSIS — J301 Allergic rhinitis due to pollen: Secondary | ICD-10-CM | POA: Diagnosis not present

## 2016-02-17 DIAGNOSIS — J301 Allergic rhinitis due to pollen: Secondary | ICD-10-CM | POA: Diagnosis not present

## 2016-02-17 DIAGNOSIS — J3081 Allergic rhinitis due to animal (cat) (dog) hair and dander: Secondary | ICD-10-CM | POA: Diagnosis not present

## 2016-02-17 DIAGNOSIS — J3089 Other allergic rhinitis: Secondary | ICD-10-CM | POA: Diagnosis not present

## 2016-02-20 DIAGNOSIS — H9042 Sensorineural hearing loss, unilateral, left ear, with unrestricted hearing on the contralateral side: Secondary | ICD-10-CM | POA: Diagnosis not present

## 2016-02-28 DIAGNOSIS — J3089 Other allergic rhinitis: Secondary | ICD-10-CM | POA: Diagnosis not present

## 2016-02-28 DIAGNOSIS — G4700 Insomnia, unspecified: Secondary | ICD-10-CM | POA: Diagnosis not present

## 2016-02-28 DIAGNOSIS — J301 Allergic rhinitis due to pollen: Secondary | ICD-10-CM | POA: Diagnosis not present

## 2016-02-28 DIAGNOSIS — G43009 Migraine without aura, not intractable, without status migrainosus: Secondary | ICD-10-CM | POA: Diagnosis not present

## 2016-02-28 DIAGNOSIS — J3081 Allergic rhinitis due to animal (cat) (dog) hair and dander: Secondary | ICD-10-CM | POA: Diagnosis not present

## 2016-03-07 DIAGNOSIS — S0033XA Contusion of nose, initial encounter: Secondary | ICD-10-CM | POA: Diagnosis not present

## 2016-03-07 DIAGNOSIS — S0083XA Contusion of other part of head, initial encounter: Secondary | ICD-10-CM | POA: Diagnosis not present

## 2016-03-07 DIAGNOSIS — G43919 Migraine, unspecified, intractable, without status migrainosus: Secondary | ICD-10-CM | POA: Diagnosis not present

## 2016-03-07 DIAGNOSIS — R55 Syncope and collapse: Secondary | ICD-10-CM | POA: Diagnosis not present

## 2016-03-09 ENCOUNTER — Ambulatory Visit
Admission: RE | Admit: 2016-03-09 | Discharge: 2016-03-09 | Disposition: A | Discharge status: Home / Self Care | Payer: 59 | Source: Ambulatory Visit | Attending: Family Medicine | Admitting: Family Medicine

## 2016-03-09 ENCOUNTER — Other Ambulatory Visit: Payer: Self-pay | Admitting: Family Medicine

## 2016-03-09 DIAGNOSIS — S022XXA Fracture of nasal bones, initial encounter for closed fracture: Secondary | ICD-10-CM | POA: Diagnosis not present

## 2016-03-09 DIAGNOSIS — S0033XA Contusion of nose, initial encounter: Secondary | ICD-10-CM

## 2016-03-13 ENCOUNTER — Encounter (HOSPITAL_COMMUNITY): Payer: Self-pay | Admitting: Emergency Medicine

## 2016-03-13 ENCOUNTER — Emergency Department (HOSPITAL_COMMUNITY): Payer: 59

## 2016-03-13 ENCOUNTER — Observation Stay (HOSPITAL_COMMUNITY)
Admission: EM | Admit: 2016-03-13 | Discharge: 2016-03-14 | Disposition: A | Discharge status: Home / Self Care | Payer: 59 | Attending: Family Medicine | Admitting: Family Medicine

## 2016-03-13 DIAGNOSIS — Z9889 Other specified postprocedural states: Secondary | ICD-10-CM | POA: Insufficient documentation

## 2016-03-13 DIAGNOSIS — Z79899 Other long term (current) drug therapy: Secondary | ICD-10-CM | POA: Insufficient documentation

## 2016-03-13 DIAGNOSIS — H903 Sensorineural hearing loss, bilateral: Secondary | ICD-10-CM | POA: Diagnosis not present

## 2016-03-13 DIAGNOSIS — Z8249 Family history of ischemic heart disease and other diseases of the circulatory system: Secondary | ICD-10-CM | POA: Diagnosis not present

## 2016-03-13 DIAGNOSIS — D72819 Decreased white blood cell count, unspecified: Secondary | ICD-10-CM | POA: Insufficient documentation

## 2016-03-13 DIAGNOSIS — S022XXA Fracture of nasal bones, initial encounter for closed fracture: Secondary | ICD-10-CM | POA: Diagnosis not present

## 2016-03-13 DIAGNOSIS — Z800 Family history of malignant neoplasm of digestive organs: Secondary | ICD-10-CM | POA: Insufficient documentation

## 2016-03-13 DIAGNOSIS — R079 Chest pain, unspecified: Secondary | ICD-10-CM | POA: Diagnosis not present

## 2016-03-13 DIAGNOSIS — R000 Tachycardia, unspecified: Secondary | ICD-10-CM | POA: Diagnosis not present

## 2016-03-13 DIAGNOSIS — R55 Syncope and collapse: Secondary | ICD-10-CM | POA: Diagnosis not present

## 2016-03-13 DIAGNOSIS — G43909 Migraine, unspecified, not intractable, without status migrainosus: Secondary | ICD-10-CM | POA: Insufficient documentation

## 2016-03-13 DIAGNOSIS — R002 Palpitations: Secondary | ICD-10-CM | POA: Diagnosis not present

## 2016-03-13 DIAGNOSIS — Z7982 Long term (current) use of aspirin: Secondary | ICD-10-CM | POA: Insufficient documentation

## 2016-03-13 DIAGNOSIS — F418 Other specified anxiety disorders: Secondary | ICD-10-CM | POA: Insufficient documentation

## 2016-03-13 DIAGNOSIS — R42 Dizziness and giddiness: Secondary | ICD-10-CM

## 2016-03-13 DIAGNOSIS — M2669 Other specified disorders of temporomandibular joint: Secondary | ICD-10-CM | POA: Diagnosis not present

## 2016-03-13 DIAGNOSIS — R0789 Other chest pain: Secondary | ICD-10-CM

## 2016-03-13 LAB — BASIC METABOLIC PANEL
Anion gap: 9 (ref 5–15)
BUN: 15 mg/dL (ref 6–20)
CO2: 27 mmol/L (ref 22–32)
Calcium: 8.9 mg/dL (ref 8.9–10.3)
Chloride: 103 mmol/L (ref 101–111)
Creatinine, Ser: 0.93 mg/dL (ref 0.44–1.00)
GFR calc Af Amer: >60 mL/min (ref >60)
GFR calc non Af Amer: >60 mL/min (ref >60)
Glucose, Bld: 121 mg/dL — ABNORMAL HIGH (ref 65–99)
Potassium: 3.5 mmol/L (ref 3.5–5.1)
Sodium: 139 mmol/L (ref 135–145)

## 2016-03-13 LAB — I-STAT TROPONIN, ED
Troponin i, poc: 0 ng/mL (ref 0.00–0.08)
Troponin i, poc: 0 ng/mL (ref 0.00–0.08)
Troponin i, poc: 0 ng/mL (ref 0.00–0.08)

## 2016-03-13 LAB — URINALYSIS, ROUTINE W REFLEX MICROSCOPIC
Bilirubin Urine: NEGATIVE
Glucose, UA: NEGATIVE mg/dL
Ketones, ur: NEGATIVE mg/dL
Nitrite: NEGATIVE
Protein, ur: NEGATIVE mg/dL
Specific Gravity, Urine: 1.005 (ref 1.005–1.030)
Squamous Epithelial / HPF: NONE SEEN
pH: 7 (ref 5.0–8.0)

## 2016-03-13 LAB — CBC
HCT: 40.6 % (ref 36.0–46.0)
Hemoglobin: 14 g/dL (ref 12.0–15.0)
MCH: 29 pg (ref 26.0–34.0)
MCHC: 34.5 g/dL (ref 30.0–36.0)
MCV: 84.2 fL (ref 78.0–100.0)
Platelets: 144 10*3/uL — ABNORMAL LOW (ref 150–400)
RBC: 4.82 MIL/uL (ref 3.87–5.11)
RDW: 13.7 % (ref 11.5–15.5)
WBC: 3 10*3/uL — ABNORMAL LOW (ref 4.0–10.5)

## 2016-03-13 LAB — TSH: TSH: 0.949 u[IU]/mL (ref 0.350–4.500)

## 2016-03-13 LAB — D-DIMER, QUANTITATIVE: D-Dimer, Quant: 0.3 ug/mL-FEU (ref 0.00–0.50)

## 2016-03-13 MED ORDER — SODIUM CHLORIDE 0.9 % IV BOLUS (SEPSIS)
500.0000 mL | Freq: Once | INTRAVENOUS | Status: AC
  Administered 2016-03-13: 500 mL via INTRAVENOUS

## 2016-03-13 NOTE — ED Notes (Signed)
Pt ambulatory to bathroom without any problems 

## 2016-03-13 NOTE — ED Provider Notes (Signed)
[facility]-EMERGENCY DEPT Provider Note   CSN: [id number] Arrival date & time: 03/13/16  1456     History   Chief Complaint Chief Complaint  Patient presents with  . Weakness  . Palpitations    HPI Stacy Watkins is a 56 y.o. female. 56 year old female with history of anxiety and depression, as well as migraine headaches that have been occurring for years, who presents status post presyncopal episode. Patient reports that last week she had a severe frontal headache typical of her usual migraines. States that when the pain was most severe, she became lightheaded, developed a pressure sensation in her chest, and passed out, striking the bridge of her nose on the countertop. States that she has felt fine since that time. She saw an ENT today, who confirmed that her nose is broken. States that after leaving his office, she developed the same pressure sensation in her chest as well as lightheadedness. States that she felt "like I was going to black out" for about 20 minutes. The symptoms became so severe that she had to pull over on the side of the road while driving and call her daughter.  Per the pt's daughter who is at bedside, the patient's speech was clear and not slurred. Patient denies any headache during this episode today. Denies vision changes, focal weakness, or focal sensory deficits. States that she initially took a Xanax as she was afraid that she might be having a panic attack, but that this did not feel like panic attacks she's had in the past. Reports that the pressure in her chest has subsided but that she still feels "woozy."  Patient reports that recently her blood pressure has been elevated to the 140s systolic, but denies diagnosis of hypertension. No history of hyperlipidemia or diabetes. She does not smoke. Denies cardiac history. She does state that she has a strong family history of heart attacks, with her sister dying in her early 56s from an MI.  HPI  Past Medical History:   Diagnosis Date  . Anxiety   . Depression   . Depression with anxiety   . Headache   . Seasonal allergies   . Situational stress     Patient Active Problem List   Diagnosis Date Noted  . Tachycardia 03/13/2016  . Headache   . Seasonal allergies   . Chest pressure   . PURE HYPERCHOLESTEROLEMIA 01/24/2009  . CAROTID STENOSIS 12/10/2008  . ANXIETY DEPRESSION 11/29/2008  . ANXIETY, SITUATIONAL 11/29/2008  . UNSPECIFIED CARDIOVASCULAR DISEASE 11/29/2008    Past Surgical History:  Procedure Laterality Date  . CARPAL TUNNEL RELEASE  2016  . CARPAL TUNNEL RELEASE Left 10/11/2014   Procedure: LEFT  ENDOSCOPIC CARPAL TUNNEL RELEASE;  Surgeon: Mack Hook, MD;  Location: [facility];  Service: Orthopedics;  Laterality: Left;  . CESAREAN SECTION    . DILATION AND CURETTAGE OF UTERUS    . EYE SURGERY      OB History    No data available       Home Medications    Prior to Admission medications   Medication Sig Start Date End Date Taking? Authorizing Provider  aspirin 81 MG tablet Take 81 mg by mouth daily.   Yes Historical Provider, MD  atenolol (TENORMIN) 100 MG tablet Take 100 mg by mouth daily.    Yes Historical Provider, MD  b complex vitamins tablet Take 1 tablet by mouth daily.   Yes Historical Provider, MD  butalbital-acetaminophen-caffeine (FIORICET, ESGIC) 50-325-40 MG tablet Take 1  tablet by mouth every 6 (six) hours as needed for migraine.  08/30/15  Yes Historical Provider, MD  Calcium Carbonate-Vitamin D (CALCIUM + D PO) Take 1 tablet by mouth daily. CHEWABLE GUMMIE   Yes Historical Provider, MD  cetirizine (ZYRTEC) 10 MG tablet Take 10 mg by mouth daily as needed for allergies.    Yes Historical Provider, MD  cyclobenzaprine (FLEXERIL) 10 MG tablet Take 10 mg by mouth every 8 (eight) hours as needed for muscle spasms. 04/01/15 03/31/16 Yes Historical Provider, MD  MULTIPLE VITAMIN PO Take 1 tablet by mouth daily.    Yes Historical Provider, MD    naproxen (NAPROSYN) 500 MG tablet Take 500 mg by mouth 2 (two) times daily as needed for migraine. 11/04/15  Yes Historical Provider, MD  nortriptyline (PAMELOR) 50 MG capsule Take 50 mg by mouth daily.    Yes Historical Provider, MD  Probiotic Product (ALIGN PO) Take 1 capsule by mouth daily.   Yes Historical Provider, MD  rizatriptan (MAXALT) 10 MG tablet Take 10 mg by mouth as needed for migraine. May repeat in 2 hours if needed   Yes Historical Provider, MD  topiramate (TOPAMAX) 100 MG tablet Take 100 mg by mouth daily.    Yes Historical Provider, MD  zolpidem (AMBIEN) 10 MG tablet Take 5 mg by mouth at bedtime as needed for sleep.    Yes Historical Provider, MD    Family History Family History  Problem Relation Age of Onset  . Heart failure Mother   . Colon cancer Father   . CAD Father     MI in his 6450s  . CAD Sister     Died at age 56 of MI    Social History Social History  Substance Use Topics  . Smoking status: Never Smoker  . Smokeless tobacco: Never Used  . Alcohol use No     Allergies   Patient has no known allergies.   Review of Systems Review of Systems  Constitutional: Negative for chills and fever.  HENT: Negative for congestion, rhinorrhea and sore throat.   Eyes: Negative for visual disturbance.  Respiratory: Negative for cough, shortness of breath and wheezing.   Cardiovascular: Positive for chest pain and palpitations. Negative for leg swelling.  Gastrointestinal: Negative for abdominal distention, abdominal pain, diarrhea, nausea and vomiting.  Genitourinary: Negative for dysuria, flank pain and frequency.  Musculoskeletal: Negative for arthralgias, back pain, gait problem, joint swelling, myalgias, neck pain and neck stiffness.  Skin: Negative for rash.  Neurological: Positive for light-headedness. Negative for dizziness, syncope, facial asymmetry, speech difficulty, weakness, numbness and headaches.  Psychiatric/Behavioral: Negative for agitation,  behavioral problems and confusion.     Physical Exam Updated Vital Signs BP 143/87   Pulse 92   Temp 98.2 F (36.8 C) (Oral)   Resp 16   SpO2 95%   Physical Exam  Constitutional: She is oriented to person, place, and time. She appears well-developed and well-nourished. No distress.  HENT:  Head: Normocephalic and atraumatic.  Right Ear: External ear normal.  Left Ear: External ear normal.  Nose: Nose normal.  Mouth/Throat: Oropharynx is clear and moist. No oropharyngeal exudate.  Eyes: Conjunctivae and EOM are normal. Pupils are equal, round, and reactive to light. Right eye exhibits no discharge. Left eye exhibits no discharge.  Neck: Normal range of motion. Neck supple.  Cardiovascular: Regular rhythm, normal heart sounds and intact distal pulses.  Exam reveals no gallop and no friction rub.   No murmur heard. Intermittent sinus  tachycardia to 125 bpm  Pulmonary/Chest: Effort normal and breath sounds normal. No respiratory distress. She has no wheezes. She has no rales.  Abdominal: Soft. Bowel sounds are normal. She exhibits no distension. There is no tenderness. There is no guarding.  Musculoskeletal: Normal range of motion. She exhibits no edema or tenderness.  Neurological: She is alert and oriented to person, place, and time. She exhibits normal muscle tone.  Face symmetric, tongue midline. 5/5 strength in the proximal and distal upper and lower extremities bilaterally, with intact sensation to light touch. Normal finger to nose, heel to shin, and rapid alternating movements. Normal speech. Normal gait.    Skin: Skin is warm and dry. No rash noted. She is not diaphoretic.  Psychiatric: She has a normal mood and affect. Her behavior is normal. Judgment and thought content normal.     ED Treatments / Results  Labs (all labs ordered are listed, but only abnormal results are displayed) Labs Reviewed  BASIC METABOLIC PANEL - Abnormal; Notable for the following:       Result  Value   Glucose, Bld 121 (*)    All other components within normal limits  CBC - Abnormal; Notable for the following:    WBC 3.0 (*)    Platelets 144 (*)    All other components within normal limits  URINALYSIS, ROUTINE W REFLEX MICROSCOPIC - Abnormal; Notable for the following:    Color, Urine STRAW (*)    Hgb urine dipstick SMALL (*)    Leukocytes, UA TRACE (*)    Bacteria, UA RARE (*)    All other components within normal limits  TSH  D-DIMER, QUANTITATIVE (NOT AT [facility])  T4  CBG MONITORING, ED  I-STAT TROPOININ, ED  I-STAT TROPOININ, ED  Rosezena Sensor, ED  Rosezena Sensor, ED    EKG  EKG Interpretation  Date/Time:  Tuesday March 13 2016 15:03:06 EDT Ventricular Rate:  100 PR Interval:  146 QRS Duration: 86 QT Interval:  356 QTC Calculation: 459 R Axis:   75 Text Interpretation:  Normal sinus rhythm Nonspecific ST abnormality Abnormal ECG No acute changes No significant change since last tracing Confirmed by Rhunette Croft, MD, Janey Genta [id number]) on 03/13/2016 7:56:04 PM       Radiology Dg Chest 2 View  Result Date: 03/13/2016 CLINICAL DATA:  Near syncope and chest discomfort. EXAM: CHEST  2 VIEW COMPARISON:  Chest radiograph [id number] FINDINGS: The heart size and mediastinal contours are within normal limits. Both lungs are clear. The visualized skeletal structures are unremarkable. IMPRESSION: No active cardiopulmonary disease. Electronically Signed   By: Deatra Robinson M.D.   On: 03/13/2016 19:05    Procedures Procedures (including critical care time)  Medications Ordered in ED Medications  sodium chloride 0.9 % bolus 500 mL (0 mLs Intravenous Stopped 03/14/16 0022)     Initial Impression / Assessment and Plan / ED Course  I have reviewed the triage vital signs and the nursing notes.  Pertinent labs & imaging results that were available during my care of the patient were reviewed by me and considered in my medical decision making (see chart for details).    Patient  appears anxious. Afebrile and hemodynamically stable. Neuro exam unremarkable as above, and patient denies slurred speech, word finding difficulty, focal weakness or sensory deficits during the event that occurred earlier in the day, and I have a low suspicion for TIA or CVA.  EKG here with sinus tachycardia, unchanged from prior. Delta troponins undetectable. TSH within normal limits. Electrolytes  normal. CBC with mild leukopenia and normal hemoglobin. Chest x-ray with no active cardiopulmonary disease.  On re-assessment, pt experienced another episode where she felt lightheaded, developed chest pressure, and heart rate increased to 125 bpm but remained in sinus rhythm. Hospitalist medicine consultated for recommendations.   Dr. Maryfrances Bunnell from hospitalist medicine evaluated the patient at bedside, and recommended IV fluids, with resolution of tachycardia. D-dimer obtained that is not elevated making PE unlikely. Patient is low risk by HEART score, and has thus been ruled out for ACS with 3 hour troponin. There is not thought to be a benefit to inpatient admission at this time. Recommend discharge home with outpatient cardiology follow-up, and return to the emergency department for new or worsening symptoms. Patient and her husband are in agreement and understanding with this plan.  Care of patient overseen by my attending, Dr. Rhunette Croft.    Final Clinical Impressions(s) / ED Diagnoses   Final diagnoses:  Palpitations  Atypical chest pain  Lightheadedness  Tachycardia  Near syncope    New Prescriptions Discharge Medication List as of 03/13/2016 11:42 PM       Jenifer Ernestina Penna, MD 03/14/16 1610    Derwood Kaplan, MD 03/15/16 [phone]

## 2016-03-13 NOTE — ED Notes (Signed)
Per admitting MD hold pt in ED don't transfer.

## 2016-03-13 NOTE — ED Notes (Signed)
Up to br with assitance

## 2016-03-13 NOTE — ED Triage Notes (Signed)
Pt reports was driving when she began to feel weak and like she was having palpitations, states she pulled over and took a xanax thinking she was having an anxiety attack. Pt reports she had a syncopal episode last Wednesday due to low blood pressure. Pt a/ox4, denies chest pain but endorses heaviness, speech clear, no neuro deficits noted.

## 2016-03-13 NOTE — Discharge Instructions (Signed)
Please return to the Emergency Department immediately for worsening symptoms. Please follow-up with your cardiologist, who may consider placing you on a Holter Monitor for further evaluation.

## 2016-03-13 NOTE — Consult Note (Signed)
Hospitalist Service Medical Consultation   [name]  ZOX:[id number]  DOB: Feb 21, 1960  DOA: 03/13/2016  PCP: Allean Found, MD   Outpatient Specialists:  Sharene Skeans, Neurology for Jilda Roche, Cardiology   Requesting physician: Dr. Moody Bruins  Reason for consultation: Pre-syncope and chest pain   History of Present Illness: Stacy Watkins is an 56 y.o. female with past medical history significant for migraines and anxiety who presents with chest discomfort and tachycardia and dizziness.  The patient was in her usual state of health until about 2 weeks ago when she and her Neurologist decreased her Topamax and she had onset of a 3 day migraine (she has since gone back to the original dosage).  On the third day of her migraine, she woke with a headache, and sometime in the morning was standing in the kitchen, hadn't eaten anything but a granola bar, suddenly felt lightheaded and dizzy and then syncopized, striking her nose on the counter. She had no chest pain, palpitations, dyspnea preceding. She woke up and was back to her normal self relatively quickly.  Later that same day, she syncopized again, with a similar "dizzy" prodrome.  Since then, she has had some intermittent chest heaviness, non-exertional, not worsened with rest or position or stress, relieved spontaneously, and she had actually been thinking of calling her Cardiologist for a follow up appointment.  Then today, she was leaving an ENT appointment (she had actually broken her nose with the first syncopal episode), when she started to feel dizzy or "like I was out of my body".  She pulled over to the side of the road and took a Xanax because she thought it might be a panic attack, but then noticed "chest heaviness" and sweatiness and malaise.  She sat there for about 20 minutes, "almost passing out".  Meanwhile, she called family, and because she was near the ER, came straight here.  ED  course: -Afebrile, heart rate 99, respirations and pulse ox normal, BP 149/76 -Na 139, K 3.5, Cr 0.93, WBC 3.0K, Hgb 14 -Troponin negative -ECG showed sinus tachycardia, no change from her previous ECG in 2015 -CXR clear -While she was being prepared for discharge, the patient became pale and complained of dizziness again while sitting in bed; her heart rate increased (~2 min) from 90-105 to 120 bpm and then returned to baseline -TRH were asked to evaluate for sinus tachycardia and pre-syncope and chest pain   Of note, the patient has a family history of premature CVD.  Her father had MI and then CABG in his 14s, her sister had a massive heart attack and died in her 74s and her maternal grandfather died in his 71s when his "heart burst in the hospital".  Mother had CHF.  No family have had silent cardiac death, died suddenly or while swimming or in their sleep.  No family history of channelopathy or structural heart disease (other than ischemic).    The patient herself was evaluated by Cardiology 2 years ago for chest pressure episodes and a reported US carotids showing 60-79% bilateral carotid plaques.  However, a follow up MRA neck showed no carotid disease at all, her lipids and CRP were excellent, and an exercise echocardiogram showed normal heart thickness and EF at rest and with stress.       Review of Systems:  As per HPI otherwise 10 systems were reviewed and  were negative.   Past Medical History: Past Medical History:  Diagnosis Date  . Anxiety   . Depression   . Depression with anxiety   . Headache   . Seasonal allergies   . Situational stress     Past Surgical History: Past Surgical History:  Procedure Laterality Date  . CARPAL TUNNEL RELEASE  2016  . CARPAL TUNNEL RELEASE Left 10/11/2014   Procedure: LEFT  ENDOSCOPIC CARPAL TUNNEL RELEASE;  Surgeon: Mack Hook, MD;  Location: [facility];  Service: Orthopedics;  Laterality: Left;  . CESAREAN  SECTION    . DILATION AND CURETTAGE OF UTERUS    . EYE SURGERY       Allergies:  No Known Allergies   Social History:  reports that she has never smoked. She has never used smokeless tobacco. She reports that she does not drink alcohol or use drugs.  She is from [place].  Went to [employer].  Is a Clinical research associate.   Family History: Family History  Problem Relation Age of Onset  . Heart failure Mother   . Colon cancer Father   . CAD Father     MI in his 31s  . CAD Sister     Died at age 23 of MI     Physical Exam: Vitals:   03/13/16 2045 03/13/16 2100 03/13/16 2115 03/13/16 2130  BP:  165/95  148/98  Pulse: 103 102 102 107  Resp: 14 15 13 13   Temp:      TempSrc:      SpO2: 96% 98% 96% 96%    Constitutional: Alert and awake, oriented x3, not in any acute distress. Eyes: PERLA, EOMI, irises appear normal, anicteric sclera,  ENMT: external ears and nose appear normal, bruise on bridge of nose, fading ecchymiosis on forehead, hearing normal            Lips appears normal, oropharynx mucosa, tongue, posterior pharynx appear normal  Neck: neck appears normal, no masses, normal ROM, no thyromegaly, no JVD  CVS: S1-S2 clear, no murmur rubs or gallops, no LE edema, normal pedal pulses  Respiratory:  clear to auscultation bilaterally, no wheezing, rales or rhonchi. Respiratory effort normal. No accessory muscle use.  GI: soft nontender, nondistended, normal bowel sounds, no hepatosplenomegaly, no hernias  Musculoskeletal: no cyanosis, clubbing or edema noted bilaterally Neuro: Pupils are 3 mm and reactive to 2 mm.  Extraocular movements are intact, without nystagmus.  Cranial nerve 5 is within normal limits.  Cranial nerve 7 is symmetrical.  Cranial nerve 8 is within normal limits.  Cranial nerves 9 and 10 reveal equal palate elevation.  Cranial nerve 11 reveals sternocleidomastoid strong.  Cranial nerve 12 is midline.  Motor strength testing is 5/5 in the upper and lower extremities  bilaterally with normal motor, tone and bulk. Sensory examination is intact to light touch.  Romberg maneuver is negative for pathology.  Finger-to-nose testing is within normal limits.  The patient is oriented to time, place and person.  Speech is fluent.  Naming is grossly intact.  Recall, recent and remote, as well as general fund of knowledge seem within normal limits.  Attention span and concentration are within normal limits. Psych: judgement and insight appear normal, stable mood and affect, mental status Skin: no rashes or lesions or ulcers, no induration or nodules    Data reviewed:  I have personally reviewed following labs and imaging studies Labs:  CBC:  Recent Labs Lab 03/13/16 1512  WBC 3.0*  HGB 14.0  HCT 40.6  MCV 84.2  PLT 144*    Basic Metabolic Panel:  Recent Labs Lab 03/13/16 1512  NA 139  K 3.5  CL 103  CO2 27  GLUCOSE 121*  BUN 15  CREATININE 0.93  CALCIUM 8.9   GFR CrCl cannot be calculated (Unknown ideal weight.). Liver Function Tests: No results for input(s): AST, ALT, ALKPHOS, BILITOT, PROT, ALBUMIN in the last 168 hours. No results for input(s): LIPASE, AMYLASE in the last 168 hours. No results for input(s): AMMONIA in the last 168 hours. Coagulation profile No results for input(s): INR, PROTIME in the last 168 hours.  Cardiac Enzymes: No results for input(s): CKTOTAL, CKMB, CKMBINDEX, TROPONINI in the last 168 hours. BNP: Invalid input(s): POCBNP CBG: No results for input(s): GLUCAP in the last 168 hours. D-Dimer No results for input(s): DDIMER in the last 72 hours. Hgb A1c No results for input(s): HGBA1C in the last 72 hours. Lipid Profile No results for input(s): CHOL, HDL, LDLCALC, TRIG, CHOLHDL, LDLDIRECT in the last 72 hours. Thyroid function studies  Recent Labs  03/13/16 1911  TSH 0.949   Anemia work up No results for input(s): VITAMINB12, FOLATE, FERRITIN, TIBC, IRON, RETICCTPCT in the last 72 hours. Urinalysis     Component Value Date/Time   COLORURINE STRAW (A) 03/13/2016 1945   APPEARANCEUR CLEAR 03/13/2016 1945   LABSPEC 1.005 03/13/2016 1945   PHURINE 7.0 03/13/2016 1945   GLUCOSEU NEGATIVE 03/13/2016 1945   HGBUR SMALL (A) 03/13/2016 1945   BILIRUBINUR NEGATIVE 03/13/2016 1945   KETONESUR NEGATIVE 03/13/2016 1945   PROTEINUR NEGATIVE 03/13/2016 1945   NITRITE NEGATIVE 03/13/2016 1945   LEUKOCYTESUR TRACE (A) 03/13/2016 1945     Sepsis Labs Invalid input(s): PROCALCITONIN,  WBC,  LACTICIDVEN Microbiology No results found for this or any previous visit (from the past 240 hour(s)).    Radiological Exams on Admission: CXR personally reviewed and shows no focal opacity, cardiomegaly or edema. Dg Chest 2 View  Result Date: 03/13/2016 CLINICAL DATA:  Near syncope and chest discomfort. EXAM: CHEST  2 VIEW COMPARISON:  Chest radiograph [id number] FINDINGS: The heart size and mediastinal contours are within normal limits. Both lungs are clear. The visualized skeletal structures are unremarkable. IMPRESSION: No active cardiopulmonary disease. Electronically Signed   By: Deatra Robinson M.D.   On: 03/13/2016 19:05   ECG personally reviewed and shows rate 100, QTc 459, ST segment scooped in inferior leads, unchanged from 2015, all sinus rhythm.    Impression/Recommendations 1. Tachycardia, pre-syncope: Overall, her episodes of syncope last week occurred in the setting of a 3-day migraine, poor intake, prodrome, and have the description of a vagal episode.  She has a structurally normal heart from her Echo in 2015, normal lipids, and a normal ECG today, making arrhythmia unlikely.  All her telemetry was reviewed personally here and showed nothing but sinus tachycardia.  Her episode of dizziness here was correlated with sinus tachycardia only, for which she was given fluids and her HR improved <100 bpm.  SF syncope rule negative. -Suspect this episode represented a little bit of dehydration exacerbated by  anxiety   2. Chest pain: HEART score 2. No personal history of vascular disease. ACS has been ruled out with 3hr troponin. -Follow up with Cardiology or PCP as an outpatient  3. Leukopenia: She has no focal symptoms of infection, which is doubted.  Can be followed up as an outpatient.   Thank you for this consultation.       Lakashia Collison P Tatyanna Cronk  M.D. [facility] 03/13/2016, 10:58 PM

## 2016-03-14 ENCOUNTER — Encounter: Payer: Self-pay | Admitting: Cardiology

## 2016-03-14 LAB — T4: T4, Total: 7.2 ug/dL (ref 4.5–12.0)

## 2016-03-14 NOTE — Telephone Encounter (Signed)
This encounter was created in error - please disregard.

## 2016-03-14 NOTE — Telephone Encounter (Signed)
New message       Pt states that she talked to the nurse this am and has another question.  Please call at your convenience

## 2016-03-19 DIAGNOSIS — J301 Allergic rhinitis due to pollen: Secondary | ICD-10-CM | POA: Diagnosis not present

## 2016-03-19 DIAGNOSIS — J3089 Other allergic rhinitis: Secondary | ICD-10-CM | POA: Diagnosis not present

## 2016-03-19 DIAGNOSIS — R079 Chest pain, unspecified: Secondary | ICD-10-CM | POA: Insufficient documentation

## 2016-03-19 DIAGNOSIS — J3081 Allergic rhinitis due to animal (cat) (dog) hair and dander: Secondary | ICD-10-CM | POA: Diagnosis not present

## 2016-03-19 NOTE — Progress Notes (Signed)
Cardiology Office Note    Date:  03/20/2016   ID:  Stacy Watkins, DOB Apr 03, 1960, MRN [id number]  PCP:  Stacy Found, MD  Cardiologist:  Dr. Jens Watkins- last seen 04/2013  CC: post ER follow up- pre syncope/palpitations/chest pressure  History of Present Illness:  Stacy Watkins is a 56 y.o. female with a history of depression/anxiety, migraines on atenolol, and family history of CAD who presents to clinic for evaluation.   Carotid Dopplers in 2010 showed 60-79% bilateral stenosis. MRA of the neck however showed no carotid stenosis. She saw Dr. Jens Watkins in the office in 04/2013 for evaluation of chest pain. Stress echo was normal at that time.   She has a history of migraines but last week had a 3 day migraine. She had minimal PO intake. On the third day, 3/7, she had two episodes of syncope. She jumped out of bed and next thing she knew she was on the ground and had broken her nose. That same day she went to the kitchen to get water because she felt dehydrated and woke up on the floor again. She went to ENT on 03/13/16 who confirmed that nose was broken and was driving home and felt dizzy. She had to pull over because she felt so dizzy and had chest pressure. She took a xanax and it didn't help. Her daughter came to pick her up and took her to the ER. EKG with sinus tachycardia (HR increased to 127), unchanged from prior. Delta troponins undetectable. TSH within normal limits. Electrolytes normal. CBC with mild leukopenia and normal hemoglobin. Chest x-ray with no active cardiopulmonary disease. Hospitalist did not think she warranted admission and she was referred for outpatient cardiology follow up.   Today she presents to clinic for follow up. She brings in a log of BP and HRs. Her BPs have been labile. Mostly normal but some up to 200/82 after some exercise (practicing dancing for a performance coming up.) HRs have been elevated 96-108. She does have some occasional palpitations while  laying down at night. She has had a lot of stress with 3 adopted kids with behavioral problems and marriage troubles, but has come out on the other end. She has been feeling good in terms of her emotional status after a lot of therapy and feels like she is finally in a good place. She eats healthy and exercises regularly. She just feels like her health is declining and she doesn't understand why. She gets some chest pressure from time to time with some tingling in her arms. Its not related exertion but comes and goes. She even has a little today, right now. It becomes a little worse with palpitation. Not worse with inspiration. She has recently felt more dyspneic with exercise that used to be relatively easy. No LE edema, orthopnea or PND. No more dizziness or syncope since 3/13. She takes atenolol for migraines. She worries about her heart due to her family history.  Her sister had a fatal MI in her 48s but she smoked and was overweight. Her father had first MI in 109s and had bypass.   Past Medical History:  Diagnosis Date  . Anxiety   . Depression   . Depression with anxiety   . Headache   . Seasonal allergies   . Situational stress     Past Surgical History:  Procedure Laterality Date  . CARPAL TUNNEL RELEASE  2016  . CARPAL TUNNEL RELEASE Left 10/11/2014   Procedure: LEFT  ENDOSCOPIC CARPAL  TUNNEL RELEASE;  Surgeon: Mack Hook, MD;  Location: [facility];  Service: Orthopedics;  Laterality: Left;  . CESAREAN SECTION    . DILATION AND CURETTAGE OF UTERUS    . EYE SURGERY      Current Medications: Outpatient Medications Prior to Visit  Medication Sig Dispense Refill  . aspirin 81 MG tablet Take 81 mg by mouth daily.    Marland Kitchen atenolol (TENORMIN) 100 MG tablet Take 100 mg by mouth daily.     Marland Kitchen b complex vitamins tablet Take 1 tablet by mouth daily.    . butalbital-acetaminophen-caffeine (FIORICET, ESGIC) 50-325-40 MG tablet Take 1 tablet by mouth every 6 (six) hours as  needed for migraine.     . Calcium Carbonate-Vitamin D (CALCIUM + D PO) Take 1 tablet by mouth daily. CHEWABLE GUMMIE    . cetirizine (ZYRTEC) 10 MG tablet Take 10 mg by mouth daily as needed for allergies.     . cyclobenzaprine (FLEXERIL) 10 MG tablet Take 10 mg by mouth every 8 (eight) hours as needed for muscle spasms.    . MULTIPLE VITAMIN PO Take 1 tablet by mouth daily.     . naproxen (NAPROSYN) 500 MG tablet Take 500 mg by mouth 2 (two) times daily as needed for migraine.    . nortriptyline (PAMELOR) 50 MG capsule Take 50 mg by mouth daily.     . Probiotic Product (ALIGN PO) Take 1 capsule by mouth daily.    . rizatriptan (MAXALT) 10 MG tablet Take 10 mg by mouth as needed for migraine. May repeat in 2 hours if needed    . topiramate (TOPAMAX) 100 MG tablet Take 100 mg by mouth daily.     Marland Kitchen zolpidem (AMBIEN) 10 MG tablet Take 5 mg by mouth at bedtime as needed for sleep.      No facility-administered medications prior to visit.      Allergies:   Patient has no known allergies.   Social History   Social History  . Marital status: Married    Spouse name: N/A  . Number of children: 5  . Years of education: N/A   Social History Main Topics  . Smoking status: Never Smoker  . Smokeless tobacco: Never Used  . Alcohol use No  . Drug use: No  . Sexual activity: Not Asked   Other Topics Concern  . None   Social History Narrative  . None     Family History:  The patient's family history includes CAD in her father and sister; Colon cancer in her father; Heart attack in her father and sister; Heart failure in her mother; Hypertension in her brother.      ROS:   Please see the history of present illness.    ROS All other systems reviewed and are negative.   PHYSICAL EXAM:   VS:  Ht 5\' 6"  (1.676 m)   Wt 140 lb (63.5 kg)   BMI 22.60 kg/m    GEN: Well nourished, well developed, in no acute distress  HEENT: normal  Neck: no JVD, carotid bruits, or masses Cardiac: RRR; no  murmurs, rubs, or gallops,no edema  Respiratory:  clear to auscultation bilaterally, normal work of breathing GI: soft, nontender, nondistended, + BS MS: no deformity or atrophy  Skin: warm and dry, no rash Neuro:  Alert and Oriented x 3, Strength and sensation are intact Psych: euthymic mood, full affect    Wt Readings from Last 3 Encounters:  03/20/16 140 lb (63.5 kg)  10/11/14 141 lb 9.6 oz (64.2 kg)  04/21/13 159 lb (72.1 kg)      Studies/Labs Reviewed:   EKG:  EKG is ordered today.  The ekg ordered today demonstrates NSR HR 79  Recent Labs: 03/13/2016: BUN 15; Creatinine, Ser 0.93; Hemoglobin 14.0; Platelets 144; Potassium 3.5; Sodium 139; TSH 0.949   Lipid Panel    Component Value Date/Time   CHOL 134 04/23/2013 0808   TRIG 89.0 04/23/2013 0808   HDL 43.20 04/23/2013 0808   CHOLHDL 3 04/23/2013 0808   VLDL 17.8 04/23/2013 0808   LDLCALC 73 04/23/2013 0808    Additional studies/ records that were reviewed today include:  Stress ECHO: 06/26/2013 Study Conclusion - Stress ECG conclusions: The stress ECG was normal. - Staged echo: Normal echo stress Impressions - Normal study after maximal exercise.   ASSESSMENT & PLAN:   Chest pain: atypical but with family history of CAD and worsening DOE will get an Ett nuclear stress test ( hold atenolol on day of stress test).    Palpitations/syncope: will get a 2D ECHO to assess cardiac structure and function and 30 day event monitor to r/o arrhythmia. Syncope likely related to dehydration from little PO intake during a 3 day migraine.   Borderline HTN: reviewed log of BPs from home. Mostly normal but did have some elevations after exercise. BP moderately elevated today but 135/80 on my recheck. Will continue to monitor at this time.   Migraines: continue atenolol    Medication Adjustments/Labs and Tests Ordered: Current medicines are reviewed at length with the patient today.  Concerns regarding medicines are outlined  above.  Medication changes, Labs and Tests ordered today are listed in the Patient Instructions below. Patient Instructions  Medication Instructions:  Your physician recommends that you continue on your current medications as directed. Please refer to the Current Medication list given to you today.   Labwork: None ordered  Testing/Procedures: Your physician has recommended that you wear an event monitor. Event monitors are medical devices that record the heart's electrical activity. Doctors most often [name] these monitors to diagnose arrhythmias. Arrhythmias are problems with the speed or rhythm of the heartbeat. The monitor is a small, portable device. You can wear one while you do your normal daily activities. This is usually used to diagnose what is causing palpitations/syncope (passing out).  Your physician has requested that you have an echocardiogram. Echocardiography is a painless test that uses sound waves to create images of your heart. It provides your doctor with information about the size and shape of your heart and how well your heart's chambers and valves are working. This procedure takes approximately one hour. There are no restrictions for this procedure.  Your physician has requested that you have en exercise stress myoview. For further information please visit [email or web address] Please follow instruction sheet, as given.   Follow-Up: Your physician recommends that you schedule a follow-up appointment in: 3 MONTHS DR. CRENSHAW    Any Other Special Instructions Will Be Listed Below (If Applicable).  Cardiac Event Monitoring A cardiac event monitor is a small recording device that is used to detect abnormal heart rhythms (arrhythmias). The monitor is used to record your heart rhythm when you have symptoms, such as:  Fast heartbeats (palpitations), such as heart racing or fluttering.  Dizziness.  Fainting or light-headedness.  Unexplained weakness. Some monitors are  wired to electrodes placed on your chest. Electrodes are flat, sticky disks that attach to your skin. Other monitors may be hand-held or worn  on the wrist. The monitor can be worn for up to 30 days. If the monitor is attached to your chest, a technician will prepare your chest for the electrode placement and show you how to work the monitor. Take time to practice using the monitor before you leave the office. Make sure you understand how to send the information from the monitor to your health care provider. In some cases, you may need to use a landline telephone instead of a cell phone. What are the risks? Generally, this device is safe to use, but it possible that the skin under the electrodes will become irritated. How to use your cardiac event monitor  Wear your monitor at all times, except when you are in water:  Do not let the monitor get wet.  Take the monitor off when you bathe. Do not swim or use a hot tub with it on.  Keep your skin clean. Do not put body lotion or moisturizer on your chest.  Change the electrodes as told by your health care provider or any time they stop sticking to your skin. You may need to use medical tape to keep them on.  Try to put the electrodes in slightly different places on your chest to help prevent skin irritation. They must remain in the area under your left breast and in the upper right section of your chest.  Make sure the monitor is safely clipped to your clothing or in a location close to your body that your health care provider recommends.  Press the button to record as soon as you feel heart-related symptoms, such as:  Dizziness.  Weakness.  Light-headedness.  Palpitations.  Thumping or pounding in your chest.  Shortness of breath.  Unexplained weakness.  Keep a diary of your activities, such as walking, doing chores, and taking medicine. It is very important to note what you were doing when you pushed the button to record your  symptoms. This will help your health care provider determine what might be contributing to your symptoms.  Send the recorded information as recommended by your health care provider. It may take some time for your health care provider to process the results.  Change the batteries as told by your health care provider.  Keep electronic devices away from your monitor. This includes:  Tablets.  MP3 players.  Cell phones.  While wearing your monitor you should avoid:  Electric blankets.  Firefighter.  Electric toothbrushes.  Microwave ovens.  Magnets.  Metal detectors. Get help right away if:  You have chest pain.  You have extreme difficulty breathing or shortness of breath.  You develop a very fast heartbeat that persists.  You develop dizziness that does not go away.  You faint or constantly feel like you are about to faint. Summary  A cardiac event monitor is a small recording device that is used to help detect abnormal heart rhythms (arrhythmias).  The monitor is used to record your heart rhythm when you have heart-related symptoms.  Make sure you understand how to send the information from the monitor to your health care provider.  It is important to press the button on the monitor when you have any heart-related symptoms.  Keep a diary of your activities, such as walking, doing chores, and taking medicine. It is very important to note what you were doing when you pushed the button to record your symptoms. This will help your health care provider learn what might be causing your symptoms. This information  is not intended to replace advice given to you by your health care provider. Make sure you discuss any questions you have with your health care provider. Document Released: 09/27/2007 Document Revised: 12/03/2015 Document Reviewed: 12/03/2015 Elsevier Interactive Patient Education  2017 [employer].  Echocardiogram An echocardiogram, or echocardiography,  uses sound waves (ultrasound) to produce an image of your heart. The echocardiogram is simple, painless, obtained within a short period of time, and offers valuable information to your health care provider. The images from an echocardiogram can provide information such as:  Evidence of coronary artery disease (CAD).  Heart size.  Heart muscle function.  Heart valve function.  Aneurysm detection.  Evidence of a past heart attack.  Fluid buildup around the heart.  Heart muscle thickening.  Assess heart valve function. Tell a health care provider about:  Any allergies you have.  All medicines you are taking, including vitamins, herbs, eye drops, creams, and over-the-counter medicines.  Any problems you or family members have had with anesthetic medicines.  Any blood disorders you have.  Any surgeries you have had.  Any medical conditions you have.  Whether you are pregnant or may be pregnant. What happens before the procedure? No special preparation is needed. Eat and drink normally. What happens during the procedure?  In order to produce an image of your heart, gel will be applied to your chest and a wand-like tool (transducer) will be moved over your chest. The gel will help transmit the sound waves from the transducer. The sound waves will harmlessly bounce off your heart to allow the heart images to be captured in real-time motion. These images will then be recorded.  You may need an IV to receive a medicine that improves the quality of the pictures. What happens after the procedure? You may return to your normal schedule including diet, activities, and medicines, unless your health care provider tells you otherwise. This information is not intended to replace advice given to you by your health care provider. Make sure you discuss any questions you have with your health care provider. Document Released: 12/16/1999 Document Revised: 08/06/2015 Document Reviewed:  08/25/2012 Elsevier Interactive Patient Education  2017 Elsevier Inc.    Exercise Stress Electrocardiogram An exercise stress electrocardiogram is a test to check how blood flows to your heart. It is done to find areas of poor blood flow. You will need to walk on a treadmill for this test. The electrocardiogram will record your heartbeat when you are at rest and when you are exercising. What happens before the procedure?  Do not have drinks with caffeine or foods with caffeine for 24 hours before the test, or as told by your doctor. This includes coffee, tea (even decaf tea), sodas, chocolate, and cocoa.  Follow your doctor's instructions about eating and drinking before the test.  Ask your doctor what medicines you should or should not take before the test. Take your medicines with water unless told by your doctor not to.  If you use an inhaler, bring it with you to the test.  Bring a snack to eat after the test.  Do not  smoke for 4 hours before the test.  Do not put lotions, powders, creams, or oils on your chest before the test.  Wear comfortable shoes and clothing. What happens during the procedure?  You will have patches put on your chest. Small areas of your chest may need to be shaved. Wires will be connected to the patches.  Your heart rate will  be watched while you are resting and while you are exercising.  You will walk on the treadmill. The treadmill will slowly get faster to raise your heart rate.  The test will take about 1-2 hours. What happens after the procedure?  Your heart rate and blood pressure will be watched after the test.  You may return to your normal diet, activities, and medicines or as told by your doctor. This information is not intended to replace advice given to you by your health care provider. Make sure you discuss any questions you have with your health care provider. Document Released: 06/06/2007 Document Revised: 08/17/2015 Document  Reviewed: 08/25/2012 Elsevier Interactive Patient Education  2017 [employer].     If you need a refill on your cardiac medications before your next appointment, please call your pharmacy.      Signed, [name], PA-C  03/20/2016 2:22 PM    [facility] [address] [place], [place], [place]  [address] Phone: [phone]; Fax: [phone]

## 2016-03-20 ENCOUNTER — Ambulatory Visit (INDEPENDENT_AMBULATORY_CARE_PROVIDER_SITE_OTHER): Payer: 59 | Admitting: Physician Assistant

## 2016-03-20 ENCOUNTER — Encounter: Payer: Self-pay | Admitting: Physician Assistant

## 2016-03-20 VITALS — Ht 66.0 in | Wt 140.0 lb

## 2016-03-20 DIAGNOSIS — I10 Essential (primary) hypertension: Secondary | ICD-10-CM | POA: Diagnosis not present

## 2016-03-20 DIAGNOSIS — R002 Palpitations: Secondary | ICD-10-CM

## 2016-03-20 DIAGNOSIS — R079 Chest pain, unspecified: Secondary | ICD-10-CM | POA: Diagnosis not present

## 2016-03-20 DIAGNOSIS — R55 Syncope and collapse: Secondary | ICD-10-CM | POA: Diagnosis not present

## 2016-03-20 NOTE — Patient Instructions (Addendum)
Medication Instructions:  Your physician recommends that you continue on your current medications as directed. Please refer to the Current Medication list given to you today.   Labwork: None ordered  Testing/Procedures: Your physician has recommended that you wear an event monitor. Event monitors are medical devices that record the heart's electrical activity. Doctors most often us these monitors to diagnose arrhythmias. Arrhythmias are problems with the speed or rhythm of the heartbeat. The monitor is a small, portable device. You can wear one while you do your normal daily activities. This is usually used to diagnose what is causing palpitations/syncope (passing out).  Your physician has requested that you have an echocardiogram. Echocardiography is a painless test that uses sound waves to create images of your heart. It provides your doctor with information about the size and shape of your heart and how well your heart's chambers and valves are working. This procedure takes approximately one hour. There are no restrictions for this procedure.  Your physician has requested that you have en exercise stress myoview. For further information please visit [email or web address]. Please follow instruction sheet, as given.   Follow-Up: Your physician recommends that you schedule a follow-up appointment in: 3 MONTHS DR. CRENSHAW    Any Other Special Instructions Will Be Listed Below (If Applicable).  Cardiac Event Monitoring A cardiac event monitor is a small recording device that is used to detect abnormal heart rhythms (arrhythmias). The monitor is used to record your heart rhythm when you have symptoms, such as:  Fast heartbeats (palpitations), such as heart racing or fluttering.  Dizziness.  Fainting or light-headedness.  Unexplained weakness. Some monitors are wired to electrodes placed on your chest. Electrodes are flat, sticky disks that attach to your skin. Other monitors may be  hand-held or worn on the wrist. The monitor can be worn for up to 30 days. If the monitor is attached to your chest, a technician will prepare your chest for the electrode placement and show you how to work the monitor. Take time to practice using the monitor before you leave the office. Make sure you understand how to send the information from the monitor to your health care provider. In some cases, you may need to use a landline telephone instead of a cell phone. What are the risks? Generally, this device is safe to use, but it possible that the skin under the electrodes will become irritated. How to use your cardiac event monitor  Wear your monitor at all times, except when you are in water:  Do not let the monitor get wet.  Take the monitor off when you bathe. Do not swim or use a hot tub with it on.  Keep your skin clean. Do not put body lotion or moisturizer on your chest.  Change the electrodes as told by your health care provider or any time they stop sticking to your skin. You may need to use medical tape to keep them on.  Try to put the electrodes in slightly different places on your chest to help prevent skin irritation. They must remain in the area under your left breast and in the upper right section of your chest.  Make sure the monitor is safely clipped to your clothing or in a location close to your body that your health care provider recommends.  Press the button to record as soon as you feel heart-related symptoms, such as:  Dizziness.  Weakness.  Light-headedness.  Palpitations.  Thumping or pounding in your chest.  Shortness of breath.  Unexplained weakness.  Keep a diary of your activities, such as walking, doing chores, and taking medicine. It is very important to note what you were doing when you pushed the button to record your symptoms. This will help your health care provider determine what might be contributing to your symptoms.  Send the recorded  information as recommended by your health care provider. It may take some time for your health care provider to process the results.  Change the batteries as told by your health care provider.  Keep electronic devices away from your monitor. This includes:  Tablets.  MP3 players.  Cell phones.  While wearing your monitor you should avoid:  Electric blankets.  Firefighter.  Electric toothbrushes.  Microwave ovens.  Magnets.  Metal detectors. Get help right away if:  You have chest pain.  You have extreme difficulty breathing or shortness of breath.  You develop a very fast heartbeat that persists.  You develop dizziness that does not go away.  You faint or constantly feel like you are about to faint. Summary  A cardiac event monitor is a small recording device that is used to help detect abnormal heart rhythms (arrhythmias).  The monitor is used to record your heart rhythm when you have heart-related symptoms.  Make sure you understand how to send the information from the monitor to your health care provider.  It is important to press the button on the monitor when you have any heart-related symptoms.  Keep a diary of your activities, such as walking, doing chores, and taking medicine. It is very important to note what you were doing when you pushed the button to record your symptoms. This will help your health care provider learn what might be causing your symptoms. This information is not intended to replace advice given to you by your health care provider. Make sure you discuss any questions you have with your health care provider. Document Released: 09/27/2007 Document Revised: 12/03/2015 Document Reviewed: 12/03/2015 Elsevier Interactive Patient Education  2017 Else[employer] Inc.  Echocardiogram An echocardiogram, or echocardiography, uses sound waves (ultrasound) to produce an image of your heart. The echocardiogram is simple, painless, obtained within a short  period of time, and offers valuable information to your health care provider. The images from an echocardiogram can provide information such as:  Evidence of coronary artery disease (CAD).  Heart size.  Heart muscle function.  Heart valve function.  Aneurysm detection.  Evidence of a past heart attack.  Fluid buildup around the heart.  Heart muscle thickening.  Assess heart valve function. Tell a health care provider about:  Any allergies you have.  All medicines you are taking, including vitamins, herbs, eye drops, creams, and over-the-counter medicines.  Any problems you or family members have had with anesthetic medicines.  Any blood disorders you have.  Any surgeries you have had.  Any medical conditions you have.  Whether you are pregnant or may be pregnant. What happens before the procedure? No special preparation is needed. Eat and drink normally. What happens during the procedure?  In order to produce an image of your heart, gel will be applied to your chest and a wand-like tool (transducer) will be moved over your chest. The gel will help transmit the sound waves from the transducer. The sound waves will harmlessly bounce off your heart to allow the heart images to be captured in real-time motion. These images will then be recorded.  You may need an IV to receive a medicine that improves  the quality of the pictures. What happens after the procedure? You may return to your normal schedule including diet, activities, and medicines, unless your health care provider tells you otherwise. This information is not intended to replace advice given to you by your health care provider. Make sure you discuss any questions you have with your health care provider. Document Released: 12/16/1999 Document Revised: 08/06/2015 Document Reviewed: 08/25/2012 Elsevier Interactive Patient Education  2017 Elsevier Inc.    Exercise Stress Electrocardiogram An exercise stress  electrocardiogram is a test to check how blood flows to your heart. It is done to find areas of poor blood flow. You will need to walk on a treadmill for this test. The electrocardiogram will record your heartbeat when you are at rest and when you are exercising. What happens before the procedure?  Do not have drinks with caffeine or foods with caffeine for 24 hours before the test, or as told by your doctor. This includes coffee, tea (even decaf tea), sodas, chocolate, and cocoa.  Follow your doctor's instructions about eating and drinking before the test.  Ask your doctor what medicines you should or should not take before the test. Take your medicines with water unless told by your doctor not to.  If you use an inhaler, bring it with you to the test.  Bring a snack to eat after the test.  Do not  smoke for 4 hours before the test.  Do not put lotions, powders, creams, or oils on your chest before the test.  Wear comfortable shoes and clothing. What happens during the procedure?  You will have patches put on your chest. Small areas of your chest may need to be shaved. Wires will be connected to the patches.  Your heart rate will be watched while you are resting and while you are exercising.  You will walk on the treadmill. The treadmill will slowly get faster to raise your heart rate.  The test will take about 1-2 hours. What happens after the procedure?  Your heart rate and blood pressure will be watched after the test.  You may return to your normal diet, activities, and medicines or as told by your doctor. This information is not intended to replace advice given to you by your health care provider. Make sure you discuss any questions you have with your health care provider. Document Released: 06/06/2007 Document Revised: 08/17/2015 Document Reviewed: 08/25/2012 Elsevier Interactive Patient Education  2017 [employer].     If you need a refill on your cardiac medications  before your next appointment, please call your pharmacy.

## 2016-03-21 ENCOUNTER — Telehealth (HOSPITAL_COMMUNITY): Payer: Self-pay | Admitting: *Deleted

## 2016-03-21 NOTE — Telephone Encounter (Signed)
Patient given detailed instructions per Myocardial Perfusion Study Information Sheet for the test on 03/23/16 at 7:45. Patient notified to arrive 15 minutes early and that it is imperative to arrive on time for appointment to keep from having the test rescheduled.  If you need to cancel or reschedule your appointment, please call the office within 24 hours of your appointment. Failure to do so may result in a cancellation of your appointment, and a $50 no show fee. Patient verbalized understanding.Daneil DolinSharon S Brooks

## 2016-03-23 ENCOUNTER — Ambulatory Visit (HOSPITAL_COMMUNITY): Payer: 59 | Attending: Cardiology

## 2016-03-23 DIAGNOSIS — R079 Chest pain, unspecified: Secondary | ICD-10-CM | POA: Insufficient documentation

## 2016-03-23 LAB — MYOCARDIAL PERFUSION IMAGING
Estimated workload: 8.5 METS
Exercise duration (min): 7 min
Exercise duration (sec): 0 s
LV dias vol: 67 mL (ref 46–106)
LV sys vol: 18 mL
MPHR: 165 {beats}/min
Peak HR: 151 {beats}/min
Percent HR: 91 %
RATE: 0.3
Rest HR: 93 {beats}/min
SDS: 6
SRS: 5
SSS: 10
TID: 1.09

## 2016-03-23 MED ORDER — TECHNETIUM TC 99M TETROFOSMIN IV KIT
10.5000 | PACK | Freq: Once | INTRAVENOUS | Status: AC | PRN
  Administered 2016-03-23: 10.5 via INTRAVENOUS
  Filled 2016-03-23: qty 11

## 2016-03-23 MED ORDER — TECHNETIUM TC 99M TETROFOSMIN IV KIT
30.9000 | PACK | Freq: Once | INTRAVENOUS | Status: AC | PRN
  Administered 2016-03-23: 30.9 via INTRAVENOUS
  Filled 2016-03-23: qty 31

## 2016-03-28 ENCOUNTER — Encounter: Payer: Self-pay | Admitting: Cardiology

## 2016-03-29 ENCOUNTER — Telehealth: Payer: Self-pay | Admitting: Cardiology

## 2016-03-29 NOTE — Telephone Encounter (Signed)
Await echo and monitor results; we can have pa see pt if needed; she can be added to my schedule Monday 4- 9 otherwise; please let pt know I am in hospital all next week Stacy Watkins

## 2016-03-29 NOTE — Telephone Encounter (Signed)
Spoke with pt she states that she is having increased SOB, chest pressure and pre-syncope she states that the episodes (has been happening for a couple of weeks)are increasing and getting longer. She states that during these episodes her heart is racing. she states that she has always exercised and now she is unable to exercise because of these episodes and her BP shooting up runs in the 200's after 5 minutes of exercise.She states that she is afraid to drive and has monitor and ECHO scheduled for next week, no appts available for next week. Her BP during these episodes are in the 130's and HR in the 80's per pt. She states that her BP today is high 149/89 HR 90(BP has never been an issue for her). Pt states that she is taking all medications as ordered. Informed pt that she is to go to the ER if she has another episode for evaluation (have someone else drive), verbalizes understanding.

## 2016-03-29 NOTE — Telephone Encounter (Signed)
New Message    Pt c/o of Chest Pain: STAT if CP now or developed within 24 hours  1. Are you having CP right now? No  2. Are you experiencing any other symptoms (ex. SOB, nausea, vomiting, sweating)? Almost blacked out, tingling in arm  3. How long have you been experiencing CP? A couple of weeks  4. Is your CP continuous or coming and going? Coming & going  5. Have you taken Nitroglycerin? No ?

## 2016-03-30 ENCOUNTER — Ambulatory Visit (INDEPENDENT_AMBULATORY_CARE_PROVIDER_SITE_OTHER): Payer: 59

## 2016-03-30 DIAGNOSIS — R55 Syncope and collapse: Secondary | ICD-10-CM | POA: Diagnosis not present

## 2016-03-30 NOTE — Telephone Encounter (Signed)
Discussed with pt scheduled appt 4-9- 

## 2016-03-30 NOTE — Telephone Encounter (Signed)
Follow up  ° ° °Pt is returning call to Michelle  °

## 2016-03-30 NOTE — Telephone Encounter (Signed)
[id number] 

## 2016-04-02 NOTE — Progress Notes (Signed)
HPI: FU chest pain, palpitations and syncope. Stress echo 6/15 normal. Admitted March 2018 with syncope resulting in nasal fracture. Episode was felt likely to have been orthostatic mediated. Troponins were normal. Nuclear study 3/18 showed EF 73 and normal perfusion. Echocardiogram April 2018 showed normal LV function and grade 1 diastolic dysfunction. Monitor 3/18 pending. Since last seen, she complains of dyspnea with vigorous activities such as exercise and generally not feeling well. She has continuous chest tightness. She continues to have episodes of palpitations described as her heart racing lasting 15 minutes. Some episodes associated with exertion. Some dizziness. Some arm tingling. No syncope.  Current Outpatient Prescriptions  Medication Sig Dispense Refill  . aspirin 81 MG tablet Take 81 mg by mouth daily.    Marland Kitchen atenolol (TENORMIN) 100 MG tablet Take 100 mg by mouth daily.     . butalbital-acetaminophen-caffeine (FIORICET, ESGIC) 50-325-40 MG tablet Take 1 tablet by mouth every 6 (six) hours as needed for migraine.     . Calcium Carbonate-Vitamin D (CALCIUM + D PO) Take 1 tablet by mouth daily. CHEWABLE GUMMIE    . cetirizine (ZYRTEC) 10 MG tablet Take 10 mg by mouth daily as needed for allergies.     Marland Kitchen MULTIPLE VITAMIN PO Take 1 tablet by mouth daily.     . naproxen (NAPROSYN) 500 MG tablet Take 500 mg by mouth 2 (two) times daily as needed for migraine.    . nortriptyline (PAMELOR) 50 MG capsule Take 50 mg by mouth daily.     . Probiotic Product (ALIGN PO) Take 1 capsule by mouth daily.    . rizatriptan (MAXALT) 10 MG tablet Take 10 mg by mouth as needed for migraine. May repeat in 2 hours if needed    . topiramate (TOPAMAX) 100 MG tablet Take 100 mg by mouth daily.     Marland Kitchen zolpidem (AMBIEN) 10 MG tablet Take 5 mg by mouth at bedtime as needed for sleep.      No current facility-administered medications for this visit.      Past Medical History:  Diagnosis Date  . Anxiety    . Depression   . Depression with anxiety   . Headache   . Seasonal allergies   . Situational stress     Past Surgical History:  Procedure Laterality Date  . CARPAL TUNNEL RELEASE  2016  . CARPAL TUNNEL RELEASE Left 10/11/2014   Procedure: LEFT  ENDOSCOPIC CARPAL TUNNEL RELEASE;  Surgeon: Mack Hook, MD;  Location: [facility];  Service: Orthopedics;  Laterality: Left;  . CESAREAN SECTION    . DILATION AND CURETTAGE OF UTERUS    . EYE SURGERY      Social History   Social History  . Marital status: Married    Spouse name: N/A  . Number of children: 5  . Years of education: N/A   Occupational History  . Not on file.   Social History Main Topics  . Smoking status: Never Smoker  . Smokeless tobacco: Never Used  . Alcohol use No  . Drug use: No  . Sexual activity: Not on file   Other Topics Concern  . Not on file   Social History Narrative  . No narrative on file    Family History  Problem Relation Age of Onset  . Heart failure Mother   . Colon cancer Father   . CAD Father     MI in his 85s  . Heart attack Father   .  CAD Sister     Died at age 44 of MI  . Heart attack Sister   . Hypertension Brother     ROS: no fevers or chills, productive cough, hemoptysis, dysphasia, odynophagia, melena, hematochezia, dysuria, hematuria, rash, seizure activity, orthopnea, PND, pedal edema, claudication. Remaining systems are negative.  Physical Exam: Well-developed well-nourished in no acute distress.  Skin is warm and dry.  HEENT is normal.  Neck is supple.  Chest is clear to auscultation with normal expansion.  Cardiovascular exam is regular rate and rhythm.  Abdominal exam nontender or distended. No masses palpated. Extremities show no edema. neuro grossly intact  ECG- 03/20/2016-sinus rhythm with no ST changes; personally reviewed  A/P  1 syncope-No recurrent episodes. Previous event sounds orthostatic mediated. LV function normal.  2  palpitations-patient continues to have palpitations. She has a monitor in place and we will await the results. Previous TSH normal. Increase atenolol to 125 mg daily.   3 borderline hypertension-I have asked her to track her blood pressure at home. We will increase medications as needed.  4 hyperlipidemia-Management per primary care.   5 chest pain-symptoms are atypical and previous nuclear study negative. We will not pursue further evaluation at this point but can consider in the future if symptoms worsen.   6 dyspnea-LV function is normal. Not volume overloaded on examination. No ischemia on nuclear study. Previous DDimer normal.  Olga Millers, MD

## 2016-04-03 ENCOUNTER — Other Ambulatory Visit: Payer: Self-pay

## 2016-04-03 ENCOUNTER — Ambulatory Visit (HOSPITAL_COMMUNITY): Payer: 59 | Attending: Cardiology

## 2016-04-03 DIAGNOSIS — Z8249 Family history of ischemic heart disease and other diseases of the circulatory system: Secondary | ICD-10-CM | POA: Diagnosis not present

## 2016-04-03 DIAGNOSIS — R55 Syncope and collapse: Secondary | ICD-10-CM | POA: Diagnosis not present

## 2016-04-06 ENCOUNTER — Encounter: Payer: Self-pay | Admitting: Cardiology

## 2016-04-06 DIAGNOSIS — J3081 Allergic rhinitis due to animal (cat) (dog) hair and dander: Secondary | ICD-10-CM | POA: Diagnosis not present

## 2016-04-06 DIAGNOSIS — J301 Allergic rhinitis due to pollen: Secondary | ICD-10-CM | POA: Diagnosis not present

## 2016-04-06 DIAGNOSIS — J3089 Other allergic rhinitis: Secondary | ICD-10-CM | POA: Diagnosis not present

## 2016-04-09 ENCOUNTER — Ambulatory Visit (INDEPENDENT_AMBULATORY_CARE_PROVIDER_SITE_OTHER): Payer: 59 | Admitting: Cardiology

## 2016-04-09 ENCOUNTER — Encounter: Payer: Self-pay | Admitting: Cardiology

## 2016-04-09 VITALS — BP 150/82 | HR 92 | Ht 66.0 in | Wt 141.0 lb

## 2016-04-09 DIAGNOSIS — I10 Essential (primary) hypertension: Secondary | ICD-10-CM

## 2016-04-09 DIAGNOSIS — R002 Palpitations: Secondary | ICD-10-CM | POA: Diagnosis not present

## 2016-04-09 DIAGNOSIS — R55 Syncope and collapse: Secondary | ICD-10-CM

## 2016-04-09 MED ORDER — ATENOLOL 25 MG PO TABS: 25.0000 mg | ORAL_TABLET | Freq: Every day | ORAL | 3 refills | Status: DC

## 2016-04-09 NOTE — Patient Instructions (Addendum)
Your physician recommends that you schedule a follow-up appointment in: 12 WEEKS WITH DR CRENSHAW   INCREASE ATENOLOL TO 125 MG ONCE DAILY= 1 OF THE 100 MG TABLETS AND 1 OF THE 25 MG TABLETS ONCE DAILY

## 2016-04-11 ENCOUNTER — Other Ambulatory Visit: Payer: Self-pay | Admitting: Family Medicine

## 2016-04-11 ENCOUNTER — Ambulatory Visit
Admission: RE | Admit: 2016-04-11 | Discharge: 2016-04-11 | Disposition: A | Discharge status: Home / Self Care | Payer: 59 | Source: Ambulatory Visit | Attending: Family Medicine | Admitting: Family Medicine

## 2016-04-11 DIAGNOSIS — R002 Palpitations: Secondary | ICD-10-CM | POA: Diagnosis not present

## 2016-04-11 DIAGNOSIS — S299XXA Unspecified injury of thorax, initial encounter: Secondary | ICD-10-CM | POA: Diagnosis not present

## 2016-04-11 DIAGNOSIS — R5383 Other fatigue: Secondary | ICD-10-CM | POA: Diagnosis not present

## 2016-04-11 DIAGNOSIS — M546 Pain in thoracic spine: Secondary | ICD-10-CM

## 2016-04-16 DIAGNOSIS — G43009 Migraine without aura, not intractable, without status migrainosus: Secondary | ICD-10-CM | POA: Diagnosis not present

## 2016-04-20 DIAGNOSIS — J3081 Allergic rhinitis due to animal (cat) (dog) hair and dander: Secondary | ICD-10-CM | POA: Diagnosis not present

## 2016-04-20 DIAGNOSIS — J3089 Other allergic rhinitis: Secondary | ICD-10-CM | POA: Diagnosis not present

## 2016-04-20 DIAGNOSIS — J301 Allergic rhinitis due to pollen: Secondary | ICD-10-CM | POA: Diagnosis not present

## 2016-05-01 DIAGNOSIS — M542 Cervicalgia: Secondary | ICD-10-CM | POA: Diagnosis not present

## 2016-05-01 DIAGNOSIS — R51 Headache: Secondary | ICD-10-CM | POA: Diagnosis not present

## 2016-05-01 DIAGNOSIS — M546 Pain in thoracic spine: Secondary | ICD-10-CM | POA: Diagnosis not present

## 2016-05-02 DIAGNOSIS — J3089 Other allergic rhinitis: Secondary | ICD-10-CM | POA: Diagnosis not present

## 2016-05-02 DIAGNOSIS — J301 Allergic rhinitis due to pollen: Secondary | ICD-10-CM | POA: Diagnosis not present

## 2016-05-02 DIAGNOSIS — J3081 Allergic rhinitis due to animal (cat) (dog) hair and dander: Secondary | ICD-10-CM | POA: Diagnosis not present

## 2016-05-08 DIAGNOSIS — M546 Pain in thoracic spine: Secondary | ICD-10-CM | POA: Diagnosis not present

## 2016-05-08 DIAGNOSIS — R51 Headache: Secondary | ICD-10-CM | POA: Diagnosis not present

## 2016-05-08 DIAGNOSIS — M542 Cervicalgia: Secondary | ICD-10-CM | POA: Diagnosis not present

## 2016-05-15 DIAGNOSIS — M6281 Muscle weakness (generalized): Secondary | ICD-10-CM | POA: Diagnosis not present

## 2016-05-15 DIAGNOSIS — R51 Headache: Secondary | ICD-10-CM | POA: Diagnosis not present

## 2016-05-15 DIAGNOSIS — M546 Pain in thoracic spine: Secondary | ICD-10-CM | POA: Diagnosis not present

## 2016-05-17 DIAGNOSIS — M546 Pain in thoracic spine: Secondary | ICD-10-CM | POA: Diagnosis not present

## 2016-05-17 DIAGNOSIS — M542 Cervicalgia: Secondary | ICD-10-CM | POA: Diagnosis not present

## 2016-05-17 DIAGNOSIS — R51 Headache: Secondary | ICD-10-CM | POA: Diagnosis not present

## 2016-05-18 DIAGNOSIS — J3081 Allergic rhinitis due to animal (cat) (dog) hair and dander: Secondary | ICD-10-CM | POA: Diagnosis not present

## 2016-05-18 DIAGNOSIS — J301 Allergic rhinitis due to pollen: Secondary | ICD-10-CM | POA: Diagnosis not present

## 2016-05-18 DIAGNOSIS — J3089 Other allergic rhinitis: Secondary | ICD-10-CM | POA: Diagnosis not present

## 2016-05-22 DIAGNOSIS — M546 Pain in thoracic spine: Secondary | ICD-10-CM | POA: Diagnosis not present

## 2016-05-22 DIAGNOSIS — M542 Cervicalgia: Secondary | ICD-10-CM | POA: Diagnosis not present

## 2016-05-22 DIAGNOSIS — M6281 Muscle weakness (generalized): Secondary | ICD-10-CM | POA: Diagnosis not present

## 2016-05-24 DIAGNOSIS — M6281 Muscle weakness (generalized): Secondary | ICD-10-CM | POA: Diagnosis not present

## 2016-05-24 DIAGNOSIS — M546 Pain in thoracic spine: Secondary | ICD-10-CM | POA: Diagnosis not present

## 2016-05-24 DIAGNOSIS — M542 Cervicalgia: Secondary | ICD-10-CM | POA: Diagnosis not present

## 2016-06-02 DIAGNOSIS — J0100 Acute maxillary sinusitis, unspecified: Secondary | ICD-10-CM | POA: Diagnosis not present

## 2016-06-07 DIAGNOSIS — M546 Pain in thoracic spine: Secondary | ICD-10-CM | POA: Diagnosis not present

## 2016-06-07 DIAGNOSIS — J209 Acute bronchitis, unspecified: Secondary | ICD-10-CM | POA: Diagnosis not present

## 2016-06-07 DIAGNOSIS — M542 Cervicalgia: Secondary | ICD-10-CM | POA: Diagnosis not present

## 2016-06-07 DIAGNOSIS — B029 Zoster without complications: Secondary | ICD-10-CM | POA: Diagnosis not present

## 2016-06-07 DIAGNOSIS — R51 Headache: Secondary | ICD-10-CM | POA: Diagnosis not present

## 2016-06-16 DIAGNOSIS — R05 Cough: Secondary | ICD-10-CM | POA: Diagnosis not present

## 2016-06-16 DIAGNOSIS — J180 Bronchopneumonia, unspecified organism: Secondary | ICD-10-CM | POA: Diagnosis not present

## 2016-06-18 NOTE — Progress Notes (Deleted)
HPI: FU chest pain, palpitations and syncope. Stress echo 6/15 normal. Admitted March 2018 with syncope resulting in nasal fracture. Episode was felt likely to have been orthostatic mediated. Troponins were normal. Nuclear study 3/18 showed EF 73 and normal perfusion. Echocardiogram April 2018 showed normal LV function and grade 1 diastolic dysfunction. Monitor 3/18 showed sinus rhythm. Since last seen,   Current Outpatient Prescriptions  Medication Sig Dispense Refill  . aspirin 81 MG tablet Take 81 mg by mouth daily.    Marland Kitchen atenolol (TENORMIN) 100 MG tablet Take 100 mg by mouth daily.     Marland Kitchen atenolol (TENORMIN) 25 MG tablet Take 1 tablet (25 mg total) by mouth daily. 90 tablet 3  . butalbital-acetaminophen-caffeine (FIORICET, ESGIC) 50-325-40 MG tablet Take 1 tablet by mouth every 6 (six) hours as needed for migraine.     . Calcium Carbonate-Vitamin D (CALCIUM + D PO) Take 1 tablet by mouth daily. CHEWABLE GUMMIE    . cetirizine (ZYRTEC) 10 MG tablet Take 10 mg by mouth daily as needed for allergies.     Marland Kitchen MULTIPLE VITAMIN PO Take 1 tablet by mouth daily.     . naproxen (NAPROSYN) 500 MG tablet Take 500 mg by mouth 2 (two) times daily as needed for migraine.    . nortriptyline (PAMELOR) 50 MG capsule Take 50 mg by mouth daily.     . Probiotic Product (ALIGN PO) Take 1 capsule by mouth daily.    . rizatriptan (MAXALT) 10 MG tablet Take 10 mg by mouth as needed for migraine. May repeat in 2 hours if needed    . topiramate (TOPAMAX) 100 MG tablet Take 100 mg by mouth daily.     Marland Kitchen zolpidem (AMBIEN) 10 MG tablet Take 5 mg by mouth at bedtime as needed for sleep.      No current facility-administered medications for this visit.      Past Medical History:  Diagnosis Date  . Anxiety   . Depression   . Depression with anxiety   . Headache   . Seasonal allergies   . Situational stress     Past Surgical History:  Procedure Laterality Date  . CARPAL TUNNEL RELEASE  2016  . CARPAL  TUNNEL RELEASE Left 10/11/2014   Procedure: LEFT  ENDOSCOPIC CARPAL TUNNEL RELEASE;  Surgeon: Mack Hook, MD;  Location: [facility];  Service: Orthopedics;  Laterality: Left;  . CESAREAN SECTION    . DILATION AND CURETTAGE OF UTERUS    . EYE SURGERY      Social History   Social History  . Marital status: Married    Spouse name: N/A  . Number of children: 5  . Years of education: N/A   Occupational History  . Not on file.   Social History Main Topics  . Smoking status: Never Smoker  . Smokeless tobacco: Never Used  . Alcohol use No  . Drug use: No  . Sexual activity: Not on file   Other Topics Concern  . Not on file   Social History Narrative  . No narrative on file    Family History  Problem Relation Age of Onset  . Heart failure Mother   . Colon cancer Father   . CAD Father        MI in his 71s  . Heart attack Father   . CAD Sister        Died at age 95 of MI  . Heart attack Sister   .  Hypertension Brother     ROS: no fevers or chills, productive cough, hemoptysis, dysphasia, odynophagia, melena, hematochezia, dysuria, hematuria, rash, seizure activity, orthopnea, PND, pedal edema, claudication. Remaining systems are negative.  Physical Exam: Well-developed well-nourished in no acute distress.  Skin is warm and dry.  HEENT is normal.  Neck is supple.  Chest is clear to auscultation with normal expansion.  Cardiovascular exam is regular rate and rhythm.  Abdominal exam nontender or distended. No masses palpated. Extremities show no edema. neuro grossly intact  ECG- personally reviewed  A/P  1  Olga MillersBrian Dshaun Reppucci, MD

## 2016-06-21 ENCOUNTER — Encounter: Payer: Self-pay | Admitting: Cardiology

## 2016-06-25 ENCOUNTER — Ambulatory Visit: Payer: 59 | Admitting: Cardiology

## 2016-06-26 DIAGNOSIS — J181 Lobar pneumonia, unspecified organism: Secondary | ICD-10-CM | POA: Diagnosis not present

## 2016-06-26 DIAGNOSIS — R911 Solitary pulmonary nodule: Secondary | ICD-10-CM | POA: Diagnosis not present

## 2016-06-30 DIAGNOSIS — J069 Acute upper respiratory infection, unspecified: Secondary | ICD-10-CM | POA: Diagnosis not present

## 2016-07-05 DIAGNOSIS — G4700 Insomnia, unspecified: Secondary | ICD-10-CM | POA: Diagnosis not present

## 2016-07-05 DIAGNOSIS — R5383 Other fatigue: Secondary | ICD-10-CM | POA: Diagnosis not present

## 2016-07-05 DIAGNOSIS — R05 Cough: Secondary | ICD-10-CM | POA: Diagnosis not present

## 2016-07-09 ENCOUNTER — Other Ambulatory Visit: Payer: Self-pay | Admitting: Family Medicine

## 2016-07-09 ENCOUNTER — Ambulatory Visit
Admission: RE | Admit: 2016-07-09 | Discharge: 2016-07-09 | Disposition: A | Discharge status: Home / Self Care | Payer: 59 | Source: Ambulatory Visit | Attending: Family Medicine | Admitting: Family Medicine

## 2016-07-09 DIAGNOSIS — J181 Lobar pneumonia, unspecified organism: Secondary | ICD-10-CM

## 2016-07-09 DIAGNOSIS — J3081 Allergic rhinitis due to animal (cat) (dog) hair and dander: Secondary | ICD-10-CM | POA: Diagnosis not present

## 2016-07-09 DIAGNOSIS — J301 Allergic rhinitis due to pollen: Secondary | ICD-10-CM | POA: Diagnosis not present

## 2016-07-09 DIAGNOSIS — J3089 Other allergic rhinitis: Secondary | ICD-10-CM | POA: Diagnosis not present

## 2016-07-09 DIAGNOSIS — R05 Cough: Secondary | ICD-10-CM | POA: Diagnosis not present

## 2016-07-19 ENCOUNTER — Encounter: Payer: Self-pay | Admitting: Internal Medicine

## 2016-07-19 ENCOUNTER — Ambulatory Visit (INDEPENDENT_AMBULATORY_CARE_PROVIDER_SITE_OTHER): Payer: 59 | Admitting: Internal Medicine

## 2016-07-19 VITALS — BP 112/80 | HR 88 | Ht 66.0 in | Wt 130.6 lb

## 2016-07-19 DIAGNOSIS — R058 Other specified cough: Secondary | ICD-10-CM | POA: Insufficient documentation

## 2016-07-19 DIAGNOSIS — J9819 Other pulmonary collapse: Secondary | ICD-10-CM

## 2016-07-19 DIAGNOSIS — R05 Cough: Secondary | ICD-10-CM

## 2016-07-19 NOTE — Patient Instructions (Addendum)
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

## 2016-07-19 NOTE — Progress Notes (Signed)
Subjective:     Patient ID: Stacy Watkins, female   DOB: [birth year],     MRN: [id number]  HPI  [age] yowf never smoker had tendency to "sinus infections" every year since age 89 and allergy shots since age 56 only issue migraines well controlled and chronic  insomnia then acutely ill with fever / aches aburpt onset mid Sept 2017 > neg test for flu and fever lasted 3-4 days and left tired x 3 weeks and 80% better  With residual "poor endurance with ex" then Jan 2018 similar illness, fever not as long, same aches and gradually improved but only 70% back to baseline  then ear ache > rx abx by mouth and ear ache resolved but hearing loss> ent eval  > pred rx 2 weeks > felt hearing nl/ 70% with poor endurance residual then early March 2018 syncope during migraine and fell on face then June 02 2016 sinus pressure over bridge of nose ("like  previous sinus infections") with yellow nasal and some coughing   Then rx abx  And proaire  s prednisone > then June 15th 2018  new sense of chest tightness with cough worse with cxr on 06/16/16 c/w rml pna > levaquin >  Some residual dry cough gradually better but cxr 07/09/16 still abn RML > augmentin and referred to pulmonary clinic 07/19/2016 by Dr   Merri Brunetteandace Smith    07/19/2016 1st [facility] office visit/ Stacy Watkins   Chief Complaint  Patient presents with  . Pulmonary Consult    Referred by Dr. Merri Brunetteandace Smith for eval of persistant right pulmonary infiltrate. Pt states dxed with PNA in sinus infection, bronchitis, and PNA June 2018. She states she has been dxed with multiple viruses since Sept 2017. She had been having CP, cough and SOB, but most recently c/o fatigue.   back to baseline now and only complaint is fatigue and mild residual nasal congestion/ has not been back to [facility] to re-eval or resume allergy shots  Not limited by breathing from desired activities    No obvious day to day or daytime variability or assoc excess/ purulent sputum or mucus plugs or  hemoptysis or cp or chest tightness, subjective wheeze or overt sinus or hb symptoms. No unusual exp hx or h/o childhood pna/ asthma or knowledge of premature birth.  Sleeping ok without nocturnal  or early am exacerbation  of respiratory  c/o's or need for noct saba. Also denies any obvious fluctuation of symptoms with weather or environmental changes or other aggravating or alleviating factors except as outlined above   Current Medications, Allergies, Complete Past Medical History, Past Surgical History, Family History, and Social History were reviewed in [facility] electronic medical record.  ROS  The following are not active complaints unless bolded sore throat, dysphagia, dental problems, itching, sneezing,  nasal congestion or excess/ purulent secretions, ear ache,   fever, chills, sweats, unintended wt loss, classically pleuritic or exertional cp,  orthopnea pnd or leg swelling, presyncope, palpitations, abdominal pain, anorexia, nausea, vomiting, diarrhea  or change in bowel or bladder habits, change in stools or urine, dysuria,hematuria,  rash, arthralgias, visual complaints, headache, numbness, weakness or ataxia or problems with walking or coordination,  change in mood/affect or memory.        Review of Systems     Objective:   Physical Exam  amb wf nad   Wt Readings from Last 3 Encounters:  07/19/16 130 lb 9.6 oz (59.2 kg)  04/09/16 141 lb (64 kg)  03/20/16 140 lb (63.5 kg)    Vital signs reviewed  - Note on arrival 02 sats  99% on RA      HEENT: edentulous bilaterally, and oropharynx. Nl external ear canals without cough reflex> moderate bilateral non-specific turbinate edema    NECK :  without JVD/Nodes/TM/ nl carotid upstrokes bilaterally   LUNGS: no acc muscle use,  Nl contour chest which is clear to A and P bilaterally without cough on insp or exp maneuvers   CV:  RRR  no s3 or murmur or increase in P2, and no edema   ABD:  soft and nontender with nl  inspiratory excursion in the supine position. No bruits or organomegaly appreciated, bowel sounds nl  MS:  Nl gait/ ext warm without deformities, calf tenderness, cyanosis or clubbing No obvious joint restrictions   SKIN: warm and dry without lesions    NEURO:  alert, approp, nl sensorium with  no motor or cerebellar deficits apparent.     I personally reviewed images and agree with radiology impression as follows:  CXR:   07/09/16  Heart size is normal. There is patchy density in the right middle lobe, compatible with infectious process. Small right pleural effusion is also noted.  My review:  The densities on lateral are accentuated by rib shadows and there is no significant effusion        Assessment:

## 2016-07-20 DIAGNOSIS — J9819 Other pulmonary collapse: Secondary | ICD-10-CM | POA: Insufficient documentation

## 2016-07-20 NOTE — Assessment & Plan Note (Signed)
Sinus CT ordered  I see no evidence of asthma at all her but encouraged her to complete the sinus ct and they consider return to Dr [place] CallasSharma to regroup re management of rhinitis/ ? Recurrent sinusitis issues or refer to ENT prn

## 2016-07-20 NOTE — Assessment & Plan Note (Signed)
Explained that infections involving the rml even in healthy never smokers frequently take weeks to months to clear and as long as no recurrent fever or cough do not necessarily indicate a need to treat - I doubt strongly this is the reason for her failure to improve back to baseline since her original febrile illness in the fall of 2018 but will do f/u cxr in 6 weeks to be sure we close the loop on this illness  Total time devoted to counseling  > 50 % of initial 60 min office visit:  review extensive hx  with pt/ discussion of rml syndrome etiology/ pathophysiology/ options/alternatives/ personally creating written customized instructions  in presence of pt  then going over those specific  Instructions directly with the pt including how to use all of the meds but in particular covering each new medication in detail and the difference between the maintenance= "automatic" meds and the prns using an action plan format for the latter (If this problem/symptom => do that organization reading Left to right).  Please see AVS from this visit for a full list of these instructions which I personally wrote for this pt and  are unique to this visit.

## 2016-07-25 ENCOUNTER — Ambulatory Visit (INDEPENDENT_AMBULATORY_CARE_PROVIDER_SITE_OTHER)
Admission: RE | Admit: 2016-07-25 | Discharge: 2016-07-25 | Disposition: A | Discharge status: Home / Self Care | Payer: 59 | Source: Ambulatory Visit | Attending: Internal Medicine | Admitting: Internal Medicine

## 2016-07-25 DIAGNOSIS — R05 Cough: Secondary | ICD-10-CM | POA: Diagnosis not present

## 2016-07-25 DIAGNOSIS — J329 Chronic sinusitis, unspecified: Secondary | ICD-10-CM | POA: Diagnosis not present

## 2016-07-25 DIAGNOSIS — R058 Other specified cough: Secondary | ICD-10-CM

## 2016-07-26 NOTE — Progress Notes (Signed)
Spoke with pt and notified of results per Dr. Wert. Pt verbalized understanding and denied any questions. 

## 2016-08-29 ENCOUNTER — Encounter: Payer: Self-pay | Admitting: Internal Medicine

## 2016-08-29 ENCOUNTER — Ambulatory Visit (INDEPENDENT_AMBULATORY_CARE_PROVIDER_SITE_OTHER)
Admission: RE | Admit: 2016-08-29 | Discharge: 2016-08-29 | Disposition: A | Discharge status: Home / Self Care | Payer: 59 | Source: Ambulatory Visit | Attending: Internal Medicine | Admitting: Internal Medicine

## 2016-08-29 ENCOUNTER — Ambulatory Visit (INDEPENDENT_AMBULATORY_CARE_PROVIDER_SITE_OTHER): Payer: 59 | Admitting: Internal Medicine

## 2016-08-29 VITALS — BP 110/70 | HR 91 | Ht 66.0 in | Wt 128.0 lb

## 2016-08-29 DIAGNOSIS — R05 Cough: Secondary | ICD-10-CM

## 2016-08-29 DIAGNOSIS — J9819 Other pulmonary collapse: Secondary | ICD-10-CM

## 2016-08-29 DIAGNOSIS — R058 Other specified cough: Secondary | ICD-10-CM

## 2016-08-29 DIAGNOSIS — J189 Pneumonia, unspecified organism: Secondary | ICD-10-CM | POA: Diagnosis not present

## 2016-08-29 NOTE — Patient Instructions (Signed)
Return for any cough than lasts more than 3 weeks or pain with breathing or unexplained shortness of breath

## 2016-08-29 NOTE — Progress Notes (Signed)
Subjective:     Patient ID: Stacy Watkins, female   DOB: 17-Apr-1960,     MRN: [id number]  HPI  55 yowf never smoker had tendency to "sinus infections" every year since age 56 and allergy shots since age 50 only issue migraines well controlled and chronic  insomnia then acutely ill with fever / aches aburpt onset mid Sept 2017 > neg test for flu and fever lasted 3-4 days and left tired x 3 weeks and 80% better  With residual "poor endurance with ex" then Jan 2018 similar illness, fever not as long, same aches and gradually improved but only 70% back to baseline  then ear ache > rx abx by mouth and ear ache resolved but hearing loss> ent eval  > pred rx 2 weeks > felt hearing nl/ 70% with poor endurance residual then early March 2018 syncope during migraine and fell on face then June 02 2016 sinus pressure over bridge of nose ("like  previous sinus infections") with yellow nasal and some coughing   Then rx abx  And proaire  s prednisone > then June 15th 2018  new sense of chest tightness with cough worse with cxr on 06/16/16 c/w rml pna > levaquin >  Some residual dry cough gradually better but cxr 07/09/16 still abn RML > augmentin and referred to pulmonary clinic 07/19/2016 by Dr   Merri Brunette.     07/19/2016 1st [facility] office visit/ Stacy Watkins   Chief Complaint  Patient presents with  . Pulmonary Consult    Referred by Dr. Merri Brunette for eval of persistant right pulmonary infiltrate. Pt states dxed with PNA in sinus infection, bronchitis, and PNA June 2018. She states she has been dxed with multiple viruses since Sept 2017. She had been having CP, cough and SOB, but most recently c/o fatigue.   back to baseline now and only complaint is fatigue and mild residual nasal congestion/ has not been back to [facility] to re-eval or resume allergy shots rec schedule sinus CT  > neg  Consider re-thinking  allergy management / need to regroup with Dr [place] Callas at some point    08/29/2016  f/u ov/Stacy Watkins re:   RML syndrome Chief Complaint  Patient presents with  . 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 She states that she is no longer having any respiratory failure.    nasal congestion fall > other times of the year  Stopped allergy shots x 6 weeks prior to OV  And can't tell any difference yet with any of her symptoms  No obvious day to day or daytime variability or assoc excess/ purulent sputum or mucus plugs or hemoptysis or cp or chest tightness, subjective wheeze or overt sinus or hb symptoms. No unusual exp hx or h/o childhood pna/ asthma or knowledge of premature birth.  Sleeping ok without nocturnal  or early am exacerbation  of respiratory  c/o's or need for noct saba. Also denies any obvious fluctuation of symptoms with weather or environmental changes or other aggravating or alleviating factors except as outlined above   Current Medications, Allergies, Complete Past Medical History, Past Surgical History, Family History, and Social History were reviewed in [facility] record.  ROS  The following are not active complaints unless bolded sore throat, dysphagia, dental problems, itching, sneezing,  nasal congestion or excess/ purulent secretions, ear ache,   fever, chills, sweats, unintended wt loss, classically pleuritic or exertional cp,  orthopnea pnd or leg swelling, presyncope, palpitations, abdominal pain, anorexia, nausea, vomiting, diarrhea  or change in bowel or bladder habits, change in stools or urine, dysuria,hematuria,  rash, arthralgias, visual complaints, headache, numbness, weakness or ataxia or problems with walking or coordination,  change in mood/affect or memory.                Objective:   Physical Exam  amb wf nad   08/29/2016        128   07/19/16 130 lb 9.6 oz (59.2 kg)  04/09/16 141 lb (64 kg)  03/20/16 140 lb (63.5 kg)    Vital signs reviewed  - Note on arrival 02 sats  99% on RA       HEENT: edentulous bilaterally, and oropharynx. Nl external ear canals without cough reflex> moderate bilateral non-specific turbinate edema    NECK :  without JVD/Nodes/TM/ nl carotid upstrokes bilaterally   LUNGS: no acc muscle use,  Nl contour chest which is clear to A and P bilaterally without cough on insp or exp maneuvers   CV:  RRR  no s3 or murmur or increase in P2, and no edema   ABD:  soft and nontender with nl inspiratory excursion in the supine position. No bruits or organomegaly appreciated, bowel sounds nl  MS:  Nl gait/ ext warm without deformities, calf tenderness, cyanosis or clubbing No obvious joint restrictions   SKIN: warm and dry without lesions    NEURO:  alert, approp, nl sensorium with  no motor or cerebellar deficits apparent.     CXR PA and Lateral:   08/29/2016 :    I personally reviewed images and  impression as follows:     Marked improvement RML changes         Assessment:

## 2016-08-30 NOTE — Assessment & Plan Note (Signed)
Sinus CT 07/25/2016  Negative for sinus inflammatory changes either acute or chronic. - stopped allergy shots mid July 2018   rec f/u [place] CallasSharma prn

## 2016-08-30 NOTE — Assessment & Plan Note (Signed)
See cxr 07/09/16 > marked improvement 08/29/2016 > no directed f/u needed in absence of symptoms in never smoker   Discussed in detail all the  indications, usual  risks and alternatives  relative to the benefits with patient who agrees to proceed with conservative f/u as outlined  = return for symptoms

## 2016-09-27 DIAGNOSIS — K648 Other hemorrhoids: Secondary | ICD-10-CM | POA: Diagnosis not present

## 2016-10-10 DIAGNOSIS — N951 Menopausal and female climacteric states: Secondary | ICD-10-CM | POA: Diagnosis not present

## 2016-10-10 DIAGNOSIS — R5383 Other fatigue: Secondary | ICD-10-CM | POA: Diagnosis not present

## 2016-10-10 DIAGNOSIS — E559 Vitamin D deficiency, unspecified: Secondary | ICD-10-CM | POA: Diagnosis not present

## 2016-10-22 DIAGNOSIS — G43009 Migraine without aura, not intractable, without status migrainosus: Secondary | ICD-10-CM | POA: Diagnosis not present

## 2016-10-22 DIAGNOSIS — G4700 Insomnia, unspecified: Secondary | ICD-10-CM | POA: Diagnosis not present

## 2016-11-02 DIAGNOSIS — J301 Allergic rhinitis due to pollen: Secondary | ICD-10-CM | POA: Diagnosis not present

## 2016-11-02 DIAGNOSIS — J3089 Other allergic rhinitis: Secondary | ICD-10-CM | POA: Diagnosis not present

## 2016-11-02 DIAGNOSIS — H1045 Other chronic allergic conjunctivitis: Secondary | ICD-10-CM | POA: Diagnosis not present

## 2016-11-08 DIAGNOSIS — L710 Perioral dermatitis: Secondary | ICD-10-CM | POA: Diagnosis not present

## 2016-11-24 DIAGNOSIS — R69 Illness, unspecified: Secondary | ICD-10-CM | POA: Diagnosis not present

## 2016-11-24 DIAGNOSIS — R509 Fever, unspecified: Secondary | ICD-10-CM | POA: Diagnosis not present

## 2016-11-29 DIAGNOSIS — B9789 Other viral agents as the cause of diseases classified elsewhere: Secondary | ICD-10-CM | POA: Diagnosis not present

## 2016-11-29 DIAGNOSIS — J069 Acute upper respiratory infection, unspecified: Secondary | ICD-10-CM | POA: Diagnosis not present

## 2016-12-06 DIAGNOSIS — Z01419 Encounter for gynecological examination (general) (routine) without abnormal findings: Secondary | ICD-10-CM | POA: Diagnosis not present

## 2016-12-06 DIAGNOSIS — Z6821 Body mass index (BMI) 21.0-21.9, adult: Secondary | ICD-10-CM | POA: Diagnosis not present

## 2016-12-11 DIAGNOSIS — R1904 Left lower quadrant abdominal swelling, mass and lump: Secondary | ICD-10-CM | POA: Diagnosis not present

## 2016-12-19 DIAGNOSIS — Z13820 Encounter for screening for osteoporosis: Secondary | ICD-10-CM | POA: Diagnosis not present

## 2016-12-31 DIAGNOSIS — L308 Other specified dermatitis: Secondary | ICD-10-CM | POA: Diagnosis not present

## 2016-12-31 DIAGNOSIS — L309 Dermatitis, unspecified: Secondary | ICD-10-CM | POA: Diagnosis not present

## 2017-01-02 DIAGNOSIS — K589 Irritable bowel syndrome without diarrhea: Secondary | ICD-10-CM | POA: Diagnosis not present

## 2017-03-28 ENCOUNTER — Encounter: Payer: Self-pay | Admitting: Neurology

## 2017-04-01 DIAGNOSIS — L309 Dermatitis, unspecified: Secondary | ICD-10-CM | POA: Diagnosis not present

## 2017-04-05 DIAGNOSIS — L235 Allergic contact dermatitis due to other chemical products: Secondary | ICD-10-CM | POA: Diagnosis not present

## 2017-04-08 DIAGNOSIS — R51 Headache: Secondary | ICD-10-CM | POA: Diagnosis not present

## 2017-04-08 DIAGNOSIS — R262 Difficulty in walking, not elsewhere classified: Secondary | ICD-10-CM | POA: Diagnosis not present

## 2017-04-08 DIAGNOSIS — R202 Paresthesia of skin: Secondary | ICD-10-CM | POA: Diagnosis not present

## 2017-04-12 DIAGNOSIS — R262 Difficulty in walking, not elsewhere classified: Secondary | ICD-10-CM | POA: Diagnosis not present

## 2017-04-12 DIAGNOSIS — R202 Paresthesia of skin: Secondary | ICD-10-CM | POA: Diagnosis not present

## 2017-04-15 DIAGNOSIS — R262 Difficulty in walking, not elsewhere classified: Secondary | ICD-10-CM | POA: Diagnosis not present

## 2017-04-15 DIAGNOSIS — R202 Paresthesia of skin: Secondary | ICD-10-CM | POA: Diagnosis not present

## 2017-04-26 ENCOUNTER — Ambulatory Visit: Payer: 59 | Admitting: Neurology

## 2017-04-26 ENCOUNTER — Encounter: Payer: Self-pay | Admitting: Neurology

## 2017-04-26 ENCOUNTER — Encounter

## 2017-04-26 VITALS — BP 140/80 | HR 83 | Ht 66.0 in | Wt 128.0 lb

## 2017-04-26 DIAGNOSIS — R202 Paresthesia of skin: Secondary | ICD-10-CM | POA: Insufficient documentation

## 2017-04-26 NOTE — Progress Notes (Signed)
[facility]rology [facility] Clinic Note - Initial Visit   Date: 04/26/17  MANUEL LAWHEAD MRN: [id number] DOB: 28-Mar-1960   Dear Dr. Sharene Skeans:  Thank you for your kind referral of Stacy Watkins for consultation of numbness. Although her history is well known to you, please allow [name] to reiterate it for the purpose of our medical record. The patient was accompanied to the clinic by self.    History of Present Illness: Stacy Watkins is a 57 y.o. right-handed Caucasian female with migraines, depression, bilateral CTS s/p release, and seasonal allergies presenting for evaluation of generalized numbness.    Starting in 2018, se began having numbness and tingling of the toes and fingers and in early 2019, symptoms increased in intensity.  Numbness predominately affects the right hand, forearm, leg, and foot. Symptoms are intermittent, occurs daily and lasts for a few hours. Activity such as overuse of the arm or walking triggers her numbness.  She does not have any relief with resting.  No associated neck or low back pain.  She has some weakness of right leg.  She has not suffered any falls and walks unassisted.  She did physical therapy with no relief.   She has long history of migraines which is followed by Dr. Anastasio Champion at [facility] and was taking topiramate 100mg  and discontinued this in February as migraines were better-controlled.  Symptoms have got worse since this time.      One year ago, she reports having a syncopal event.  She has had three more spells of lightheadedness without loss of consciousness.  Her cardiac work-up was normal.   She also sees a functional medicine provider at [facility] and was diagnosed with Malachi Carl Virus.  Her vitamin B12 and TSH is checked every 6 months and is normal.   Out-side paper records, electronic medical record, and images have been reviewed where available and summarized as:  Lab Results  Component Value Date     TSH 0.949 03/13/2016   MRA neck 01/11/2009:  Normal   Past Medical History:  Diagnosis Date  . Anxiety   . Depression   . Depression with anxiety   . Headache   . Seasonal allergies   . Situational stress     Past Surgical History:  Procedure Laterality Date  . CARPAL TUNNEL RELEASE  2016  . CARPAL TUNNEL RELEASE Left 10/11/2014   Procedure: LEFT  ENDOSCOPIC CARPAL TUNNEL RELEASE;  Surgeon: Mack Hook, MD;  Location: [facility];  Service: Orthopedics;  Laterality: Left;  . CESAREAN SECTION    . DILATION AND CURETTAGE OF UTERUS    . EYE SURGERY       Medications:  Outpatient Encounter Medications as of 04/26/2017  Medication Sig  . aspirin 81 MG tablet Take 81 mg by mouth daily.  Marland Kitchen atenolol (TENORMIN) 100 MG tablet Take 100 mg by mouth daily.   . butalbital-acetaminophen-caffeine (FIORICET, ESGIC) 50-325-40 MG tablet Take 1 tablet by mouth every 6 (six) hours as needed for migraine.   Marland Kitchen CALCIUM PO Take by mouth.  . cetirizine (ZYRTEC) 10 MG tablet Take by mouth.  . Cholecalciferol (VITAMIN D PO) Take by mouth.  . Morphine-Naltrexone 20-0.8 MG CPCR Take 1 tablet by mouth daily.  . MULTIPLE VITAMIN PO Take 1 tablet by mouth daily.   . naproxen (NAPROSYN) 500 MG tablet Take 500 mg by mouth 2 (two) times daily as needed for migraine.  . NON FORMULARY 75 mg. Pregnenolone  .  nortriptyline (PAMELOR) 50 MG capsule Take 50 mg by mouth daily.   . Omega-3 Fatty Acids (FISH OIL PO) Take by mouth.  Marland Kitchen OVER THE COUNTER MEDICATION   . Probiotic Product (ALIGN PO) Take 1 capsule by mouth daily.  . rizatriptan (MAXALT) 10 MG tablet Take 10 mg by mouth as needed for migraine. May repeat in 2 hours if needed  . UNABLE TO FIND Med Name: Compound Med- nystatin-amphoteracin twice daily  . VITAMIN A PO Take by mouth.  Marland Kitchen VITAMIN K PO Take by mouth.  . zolpidem (AMBIEN) 10 MG tablet Take 5 mg by mouth at bedtime as needed for sleep.   . [DISCONTINUED] cetirizine (ZYRTEC) 10  MG tablet Take 10 mg by mouth daily as needed for allergies.   . [DISCONTINUED] Digestive Enzymes (BETAINE HCL PO) Take 1 tablet by mouth 3 (three) times daily with meals.  . [DISCONTINUED] Digestive Enzymes (ENZYME DIGEST PO) Take by mouth 3 (three) times daily with meals.  . [DISCONTINUED] Misc Natural Products (ADRENAL) CAPS Take 1 capsule by mouth daily.  . [DISCONTINUED] topiramate (TOPAMAX) 100 MG tablet Take 100 mg by mouth daily.    No facility-administered encounter medications on file as of 04/26/2017.      Allergies:  Allergies  Allergen Reactions  . Dust Mite Extract Other (See Comments)  . Mold Extract  [Trichophyton] Other (See Comments)    Family History: Family History  Problem Relation Age of Onset  . Heart failure Mother   . Colon cancer Father   . CAD Father        MI in his 71s  . Heart attack Father   . CAD Sister        Died at age 48 of MI  . Heart attack Sister   . Hypertension Brother     Social History: Social History   Tobacco Use  . Smoking status: Never Smoker  . Smokeless tobacco: Never Used  Substance Use Topics  . Alcohol use: No  . Drug use: No   Social History   Social History Narrative   Lives with husband in a one story home.  Has 5 children (2 biological and 3 adopted).  Works as a Clinical research associate.  Education: college.     Review of Systems:  CONSTITUTIONAL: No fevers, chills, night sweats, or weight loss.   EYES: No visual changes or eye pain ENT: No hearing changes.  No history of nose bleeds.   RESPIRATORY: No cough, wheezing and shortness of breath.   CARDIOVASCULAR: Negative for chest pain, and palpitations.  +fatigue + lightheadedness GI: Negative for abdominal discomfort, blood in stools or black stools.  No recent change in bowel habits.   GU:  No history of incontinence.   MUSCLOSKELETAL: No history of joint pain or swelling.  No myalgias.   SKIN: Negative for lesions, rash, and itching.   HEMATOLOGY/ONCOLOGY: Negative for  prolonged bleeding, bruising easily, and swollen nodes.  No history of cancer.   ENDOCRINE: Negative for cold or heat intolerance, polydipsia or goiter.   PSYCH:  No depression or anxiety symptoms.   NEURO: As Above.   Vital Signs:  BP 140/80   Pulse 83   Ht 5\' 6"  (1.676 m)   Wt 128 lb (58.1 kg)   SpO2 96%   BMI 20.66 kg/m    General Medical Exam:   General:  Well appearing, comfortable.   Eyes/ENT: see cranial nerve examination.   Neck: No masses appreciated.  Full range of motion without  tenderness.  No carotid bruits. Respiratory:  Clear to auscultation, good air entry bilaterally.   Cardiac:  Regular rate and rhythm, no murmur.   Extremities:  No deformities, edema, or skin discoloration.  Skin:  No rashes or lesions.  Neurological Exam: MENTAL STATUS including orientation to time, place, person, recent and remote memory, attention span and concentration, language, and fund of knowledge is normal.  Speech is not dysarthric.  CRANIAL NERVES: II:  No visual field defects.  Unremarkable fundi.   III-IV-VI: Pupils equal round and reactive to light.  Normal conjugate, extra-ocular eye movements in all directions of gaze.  No nystagmus.  No ptosis.   V:  Normal facial sensation.     VII:  Normal facial symmetry and movements.   VIII:  Normal hearing and vestibular function.   IX-X:  Normal palatal movement.   XI:  Normal shoulder shrug and head rotation.   XII:  Normal tongue strength and range of motion, no deviation or fasciculation.  MOTOR:  No atrophy, fasciculations or abnormal movements.  No pronator drift.  Tone is normal.    Right Upper Extremity:    Left Upper Extremity:    Deltoid  5/5   Deltoid  5/5   Biceps  5/5   Biceps  5/5   Triceps  5/5   Triceps  5/5   Wrist extensors  5/5   Wrist extensors  5/5   Wrist flexors  5/5   Wrist flexors  5/5   Finger extensors  5/5   Finger extensors  5/5   Finger flexors  5/5   Finger flexors  5/5   Dorsal interossei  5/5    Dorsal interossei  5/5   Abductor pollicis  5/5   Abductor pollicis  5/5   Tone (Ashworth scale)  0  Tone (Ashworth scale)  0   Right Lower Extremity:    Left Lower Extremity:    Hip flexors  5/5   Hip flexors  5/5   Hip extensors  5/5   Hip extensors  5/5   Knee flexors  5/5   Knee flexors  5/5   Knee extensors  5/5   Knee extensors  5/5   Dorsiflexors  5/5   Dorsiflexors  5/5   Plantarflexors  5/5   Plantarflexors  5/5   Toe extensors  5/5   Toe extensors  5/5   Toe flexors  5/5   Toe flexors  5/5   Tone (Ashworth scale)  0  Tone (Ashworth scale)  0   MSRs:  Right                                                                 Left brachioradialis 2+  brachioradialis 2+  biceps 2+  biceps 2+  triceps 2+  triceps 2+  patellar 2+  patellar 2+  ankle jerk 2+  ankle jerk 2+  Hoffman no  Hoffman no  plantar response down  plantar response down   SENSORY:  Normal and symmetric perception of light touch, pinprick, vibration, and proprioception.  Romberg's sign absent. Negative Tinel's at the wrist and elbow bilaterally.   COORDINATION/GAIT: Normal finger-to- nose-finger.  Intact rapid alternating movements bilaterally.   Gait narrow based and stable. Tandem and stressed gait intact.  IMPRESSION: Mrs. Gwendolyn GrantWalden is a 57 year-old female referred for evaluation of intermittent migratory paresthesias, worse on the right.  Her neurological exam is entirely normal and non-focal, which is reassuring.  Symptoms have become more intense since stopping topiramate so unlikely medication side effect.  I will plan to get NCS/EMG of the right arm and leg to assess for peripheral nerve pathology.  If this is normal, the next step is MRI brain and cervical spine, but my overall suspicion for anything worrisome is low.     Thank you for allowing me to participate in patient's care.  If I can answer any additional questions, I would be pleased to do so.    Sincerely,    Toryn Mcclinton K. Allena KatzPatel, DO

## 2017-04-26 NOTE — Patient Instructions (Addendum)
NCS/EMG of the right arm and leg  We will call you with the results 

## 2017-04-30 ENCOUNTER — Ambulatory Visit (INDEPENDENT_AMBULATORY_CARE_PROVIDER_SITE_OTHER): Payer: 59 | Admitting: Neurology

## 2017-04-30 DIAGNOSIS — R202 Paresthesia of skin: Secondary | ICD-10-CM | POA: Diagnosis not present

## 2017-04-30 MED ORDER — DIAZEPAM 5 MG PO TABS: ORAL_TABLET | ORAL | 0 refills | Status: DC

## 2017-04-30 NOTE — Progress Notes (Signed)
Follow-up Visit   Date: 04/30/17    Stacy Watkins MRN: [id number] DOB: 1960/06/06   Interim History: Stacy Watkins is a 57 y.o. right-handed Caucasian female with migraines, depression, bilateral CTS s/p release, and seasonal allergies returning to the clinic with generalized numbness/tingling and electrodiagnostic testing.  The patient was accompanied to the clinic by self.  History of present illness: Starting in 2018, se began having numbness and tingling of the toes and fingers and in early 2019, symptoms increased in intensity.  Numbness predominately affects the right hand, forearm, leg, and foot. Symptoms are intermittent, occurs daily and lasts for a few hours. Activity such as overuse of the arm or walking triggers her numbness.  She does not have any relief with resting.  No associated neck or low back pain.  She has some weakness of right leg.  She has not suffered any falls and walks unassisted.  She did physical therapy with no relief.   UPDATE 04/30/2017:  She is here for electrodiagnostic testing and review results which was normal.  Yesterday, she started steroids for migraines and has not appreciated any change in her paresthesia, but it may be too early to tell.  She is concerned about the next step to investigate symptoms.  She endorses a significant amount of stress over the past few years and has been seeing a marriage counselor as well as a Catering manager.   Medications:  Current Outpatient Medications on File Prior to Visit  Medication Sig Dispense Refill  . aspirin 81 MG tablet Take 81 mg by mouth daily.    Marland Kitchen atenolol (TENORMIN) 100 MG tablet Take 100 mg by mouth daily.     . butalbital-acetaminophen-caffeine (FIORICET, ESGIC) 50-325-40 MG tablet Take 1 tablet by mouth every 6 (six) hours as needed for migraine.     Marland Kitchen CALCIUM PO Take by mouth.    . cetirizine (ZYRTEC) 10 MG tablet Take by mouth.    . Cholecalciferol (VITAMIN D PO) Take by mouth.    .  Morphine-Naltrexone 20-0.8 MG CPCR Take 1 tablet by mouth daily.    . MULTIPLE VITAMIN PO Take 1 tablet by mouth daily.     . naproxen (NAPROSYN) 500 MG tablet Take 500 mg by mouth 2 (two) times daily as needed for migraine.    . NON FORMULARY 75 mg. Pregnenolone    . nortriptyline (PAMELOR) 50 MG capsule Take 50 mg by mouth daily.     . Omega-3 Fatty Acids (FISH OIL PO) Take by mouth.    Marland Kitchen OVER THE COUNTER MEDICATION     . Probiotic Product (ALIGN PO) Take 1 capsule by mouth daily.    . rizatriptan (MAXALT) 10 MG tablet Take 10 mg by mouth as needed for migraine. May repeat in 2 hours if needed    . UNABLE TO FIND Med Name: Compound Med- nystatin-amphoteracin twice daily    . VITAMIN A PO Take by mouth.    Marland Kitchen VITAMIN K PO Take by mouth.    . zolpidem (AMBIEN) 10 MG tablet Take 5 mg by mouth at bedtime as needed for sleep.      No current facility-administered medications on file prior to visit.     Allergies:  Allergies  Allergen Reactions  . Dust Mite Extract Other (See Comments)  . Mold Extract  [Trichophyton] Other (See Comments)    Review of Systems:  CONSTITUTIONAL: No fevers, chills, night sweats, or weight loss.  EYES: No visual changes or eye  pain ENT: No hearing changes.  No history of nose bleeds.   RESPIRATORY: No cough, wheezing and shortness of breath.   CARDIOVASCULAR: Negative for chest pain, and palpitations.   GI: Negative for abdominal discomfort, blood in stools or black stools.  No recent change in bowel habits.   GU:  No history of incontinence.   MUSCLOSKELETAL: No history of joint pain or swelling.  No myalgias.   SKIN: Negative for lesions, rash, and itching.   ENDOCRINE: Negative for cold or heat intolerance, polydipsia or goiter.   PSYCH:  No depression +anxiety symptoms.   NEURO: As Above.   Neurological Exam: MENTAL STATUS including orientation to time, place, person, recent and remote memory, attention span and concentration, language, and fund of  knowledge is normal.  Speech is not dysarthric.  CRANIAL NERVES: Face is symmetric.  MOTOR:  Motor strength is 5/5 in all extremities   Data: NCS/EMG of the right side 04/30/2017:  Normal.  No evidence of a sensorimotor polyneuropathy or cervical/lumbosacral radiculopathy.  IMPRESSION/PLAN: Migratory paresthesias.  Exam and NCS/EMG is normal.  Recommend MRI brain wwo contrast as the next step to evaluate for demyelinating disease as this can have patchy sensory changes.   If this returns normal, we also discussed the possibility that symptoms may be stress or anxiety-induced.  She is already seeing a therapist.    Greater than 80% of this 15 minute visit was spent in counseling, explanation of diagnosis, planning of further management, and coordination of care, excluding procedure time.    Thank you for allowing me to participate in patient's care.  If I can answer any additional questions, I would be pleased to do so.    Sincerely,    Donika K. Allena Katz, DO

## 2017-04-30 NOTE — Procedures (Signed)
[facility]  [address] [place], [address]  [place], [place] [address] Tel: [phone] Fax:  [phone] Test Date:  04/30/2017  Patient: Stacy Watkins DOB: 03-Jun-1960 Physician: Nita Sickle, DO  Sex: Female Height:  Ref Phys: Nita Sickle, Do  ID#: [id number] Temp: 32.0C Technician:    Patient Complaints: This is a 57 year old female with migratory paresthesias arms and legs, worse on the right.  NCV & EMG Findings: Extensive electrodiagnostic testing of the right upper and lower extremity shows:  1. All sensory responses including the right median, ulnar, sural, and superficial peroneal nerves are within normal limits. 2. All motor responses including the right median, ulnar, peroneal, and tibial nerves are within normal limits. 3. Right tibial H reflex study is within normal limits. 4. There is no evidence of active or chronic motor axon loss changes affecting any of the tested muscles. Motor unit configuration and recruitment pattern is within normal limits.  Impression: This is a normal study of the right side. In particular, there is no evidence of a sensorimotor polyneuropathy or cervical/lumbosacral radiculopathy.   ___________________________ Nita Sickle, DO    Nerve Conduction Studies Anti Sensory Summary Table   Site NR Peak (ms) Norm Peak (ms) P-T Amp (V) Norm P-T Amp  Right Median Anti Sensory (2nd Digit)  Wrist    3.5 <3.6 24.4 >15  Right Sup Peroneal Anti Sensory (Ant Lat Mall)  12 cm    3.0 <4.6 8.2 >4  Right Sural Anti Sensory (Lat Mall)  Calf    3.2 <4.6 10.2 >4  Right Ulnar Anti Sensory (5th Digit)  Wrist    2.4 <3.1 39.9 >10   Motor Summary Table   Site NR Onset (ms) Norm Onset (ms) O-P Amp (mV) Norm O-P Amp Site1 Site2 Delta-0 (ms) Dist (cm) Vel (m/s) Norm Vel (m/s)  Right Median Motor (Abd Poll Brev)  Wrist    3.8 <4.0 7.7 >6 Elbow Wrist 4.6 28.0 61 >50  Elbow    8.4  7.1         Right Peroneal Motor (Ext Dig Brev)  Ankle     3.7 <6.0 4.0 >2.5 B Fib Ankle 6.9 37.0 54 >40  B Fib    10.6  3.4  Poplt B Fib 1.3 7.0 54 >40  Poplt    11.9  3.3         Right Tibial Motor (Abd Hall Brev)  Ankle    2.8 <6.0 9.9 >4 Knee Ankle 8.7 38.0 44 >40  Knee    11.5  5.8         Right Ulnar Motor (Abd Dig Minimi)  Wrist    2.3 <3.1 8.8 >7 B Elbow Wrist 3.5 22.5 64 >50  B Elbow    5.8  8.8  A Elbow B Elbow 1.8 10.0 56 >50  A Elbow    7.6  8.8          H Reflex Studies   NR H-Lat (ms) Lat Norm (ms) L-R H-Lat (ms)  Right Tibial (Gastroc)     33.06 <35    EMG   Side Muscle Ins Act Fibs Psw Fasc Number Recrt Dur Dur. Amp Amp. Poly Poly. Comment  Right 1stDorInt Nml Nml Nml Nml Nml Nml Nml Nml Nml Nml Nml Nml N/A  Right Abd Poll Brev Nml Nml Nml Nml Nml Nml Nml Nml Nml Nml Nml Nml N/A  Right PronatorTeres Nml Nml Nml Nml Nml Nml Nml Nml Nml Nml Nml Nml N/A  Right Biceps Nml Nml Nml Nml Nml Nml Nml Nml Nml Nml Nml Nml N/A  Right Triceps Nml Nml Nml Nml Nml Nml Nml Nml Nml Nml Nml Nml N/A  Right Deltoid Nml Nml Nml Nml Nml Nml Nml Nml Nml Nml Nml Nml N/A  Right AntTibialis Nml Nml Nml Nml Nml Nml Nml Nml Nml Nml Nml Nml N/A  Right Gastroc Nml Nml Nml Nml Nml Nml Nml Nml Nml Nml Nml Nml N/A  Right Flex Dig Long Nml Nml Nml Nml Nml Nml Nml Nml Nml Nml Nml Nml N/A  Right RectFemoris Nml Nml Nml Nml Nml Nml Nml Nml Nml Nml Nml Nml N/A  Right GluteusMed Nml Nml Nml Nml Nml Nml Nml Nml Nml Nml Nml Nml N/A      Waveforms:

## 2017-05-10 DIAGNOSIS — R42 Dizziness and giddiness: Secondary | ICD-10-CM | POA: Diagnosis not present

## 2017-05-10 DIAGNOSIS — R202 Paresthesia of skin: Secondary | ICD-10-CM | POA: Diagnosis not present

## 2017-05-14 ENCOUNTER — Telehealth: Payer: Self-pay | Admitting: Neurology

## 2017-05-14 NOTE — Telephone Encounter (Signed)
Patient given results

## 2017-05-14 NOTE — Telephone Encounter (Signed)
MRI head with and without contrast 05/10/2017:  Normal MRI of brain.  Please reassure patient that her MRI is normal. With both her imaging and EMG being normal, the symptoms are most likely stress-induced.

## 2017-05-14 NOTE — Telephone Encounter (Signed)
See next note

## 2017-05-14 NOTE — Telephone Encounter (Signed)
Patient called to check on her MRI Results. Please Call. Thanks

## 2017-05-20 DIAGNOSIS — N951 Menopausal and female climacteric states: Secondary | ICD-10-CM | POA: Diagnosis not present

## 2017-05-20 DIAGNOSIS — R5383 Other fatigue: Secondary | ICD-10-CM | POA: Diagnosis not present

## 2017-05-20 DIAGNOSIS — E039 Hypothyroidism, unspecified: Secondary | ICD-10-CM | POA: Diagnosis not present

## 2017-05-20 DIAGNOSIS — E559 Vitamin D deficiency, unspecified: Secondary | ICD-10-CM | POA: Diagnosis not present

## 2017-06-05 DIAGNOSIS — N951 Menopausal and female climacteric states: Secondary | ICD-10-CM | POA: Diagnosis not present

## 2017-06-05 DIAGNOSIS — G4700 Insomnia, unspecified: Secondary | ICD-10-CM | POA: Diagnosis not present

## 2017-06-05 DIAGNOSIS — R5383 Other fatigue: Secondary | ICD-10-CM | POA: Diagnosis not present

## 2017-08-08 DIAGNOSIS — M65331 Trigger finger, right middle finger: Secondary | ICD-10-CM | POA: Diagnosis not present

## 2017-09-05 DIAGNOSIS — M65331 Trigger finger, right middle finger: Secondary | ICD-10-CM | POA: Diagnosis not present

## 2017-09-27 DIAGNOSIS — Z800 Family history of malignant neoplasm of digestive organs: Secondary | ICD-10-CM | POA: Diagnosis not present

## 2017-09-27 DIAGNOSIS — K648 Other hemorrhoids: Secondary | ICD-10-CM | POA: Diagnosis not present

## 2017-10-16 DIAGNOSIS — Z800 Family history of malignant neoplasm of digestive organs: Secondary | ICD-10-CM | POA: Diagnosis not present

## 2017-10-16 DIAGNOSIS — K648 Other hemorrhoids: Secondary | ICD-10-CM | POA: Diagnosis not present

## 2017-10-23 DIAGNOSIS — G43009 Migraine without aura, not intractable, without status migrainosus: Secondary | ICD-10-CM | POA: Diagnosis not present

## 2017-10-31 DIAGNOSIS — Z800 Family history of malignant neoplasm of digestive organs: Secondary | ICD-10-CM | POA: Diagnosis not present

## 2017-10-31 DIAGNOSIS — Z1211 Encounter for screening for malignant neoplasm of colon: Secondary | ICD-10-CM | POA: Diagnosis not present

## 2017-10-31 DIAGNOSIS — K5730 Diverticulosis of large intestine without perforation or abscess without bleeding: Secondary | ICD-10-CM | POA: Diagnosis not present

## 2017-10-31 DIAGNOSIS — Z8371 Family history of colonic polyps: Secondary | ICD-10-CM | POA: Diagnosis not present

## 2017-11-13 IMAGING — CR DG THORACIC SPINE 3V
3 series · 3 of 3 positions shown · non-contrast
Comparison: Chest x-ray 03/13/2016

CLINICAL DATA: Fell 5 weeks ago with right mid thoracic spine pain

EXAM:
THORACIC SPINE - 3 VIEWS

[w thoracic spine ap]
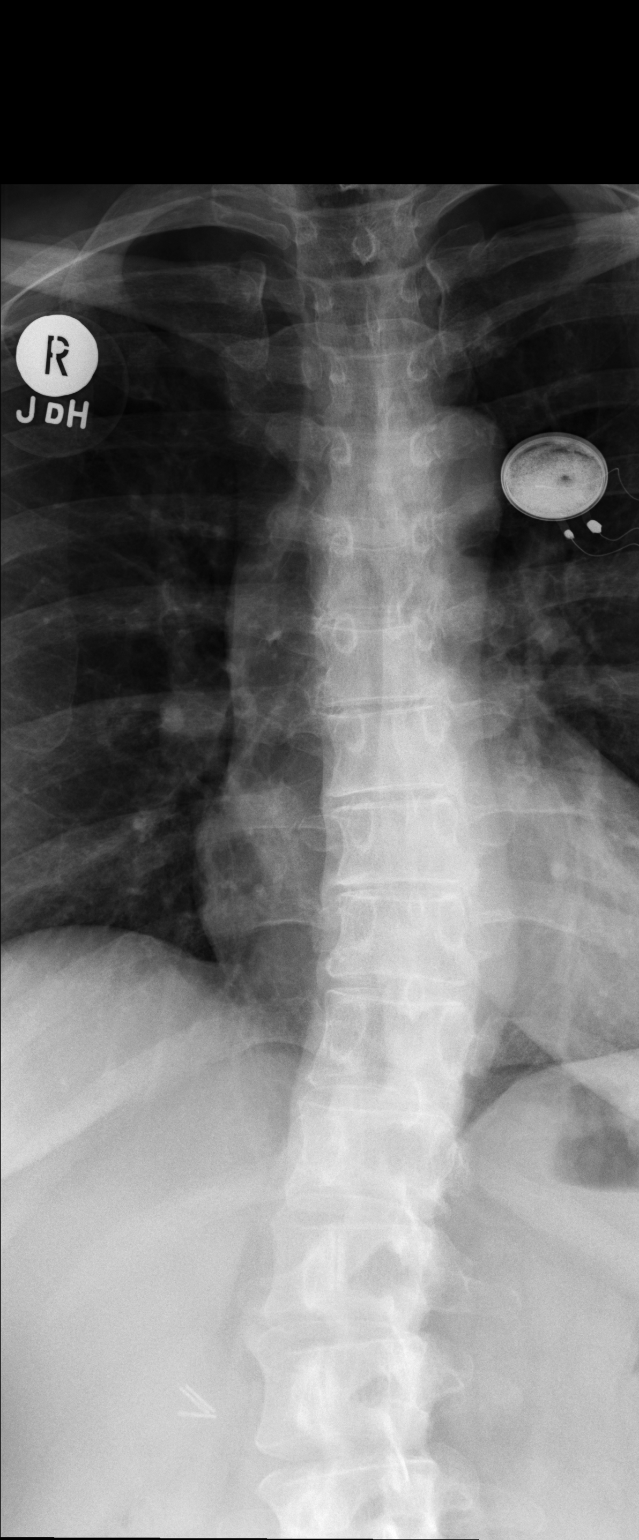

[w thoracic spine lat]
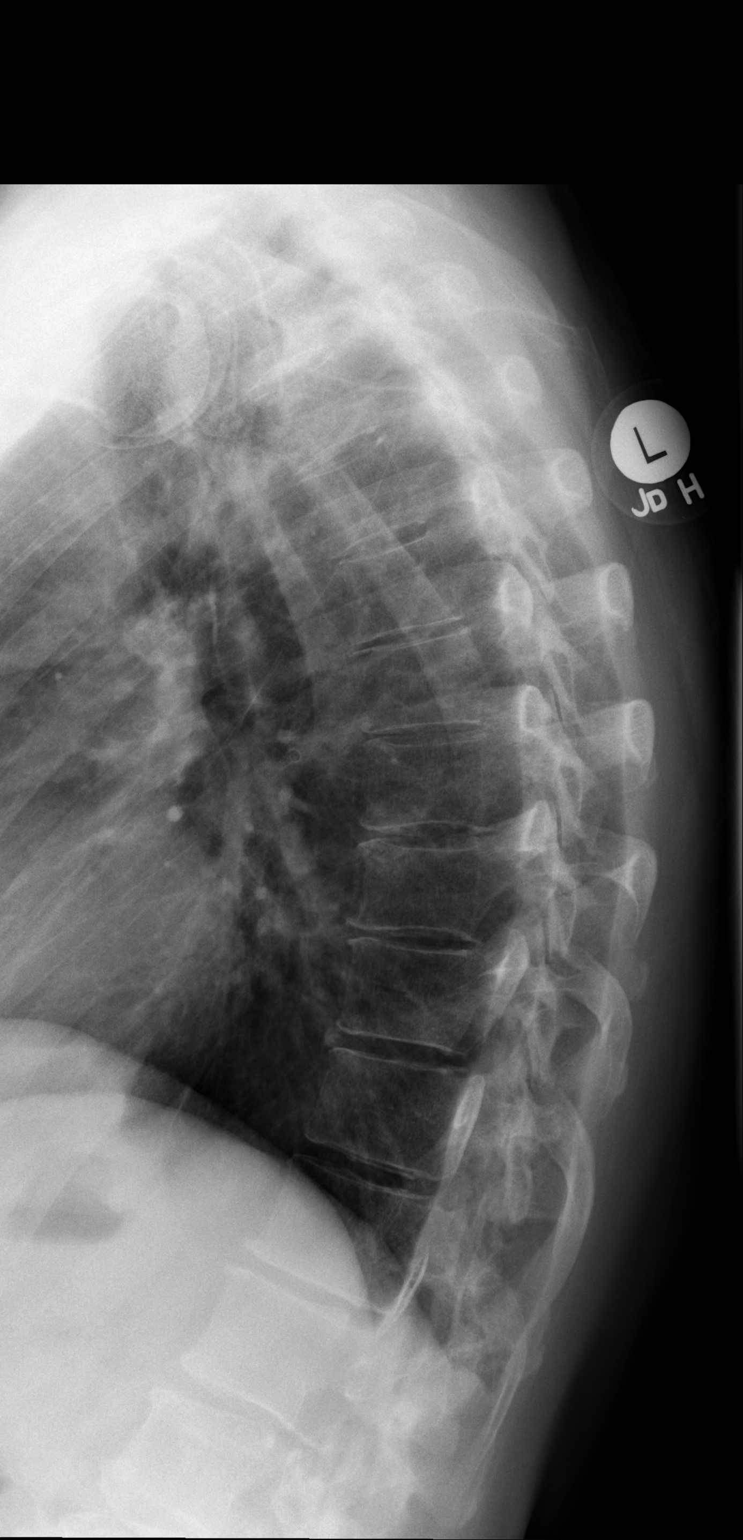

[w thoracic swimmers]
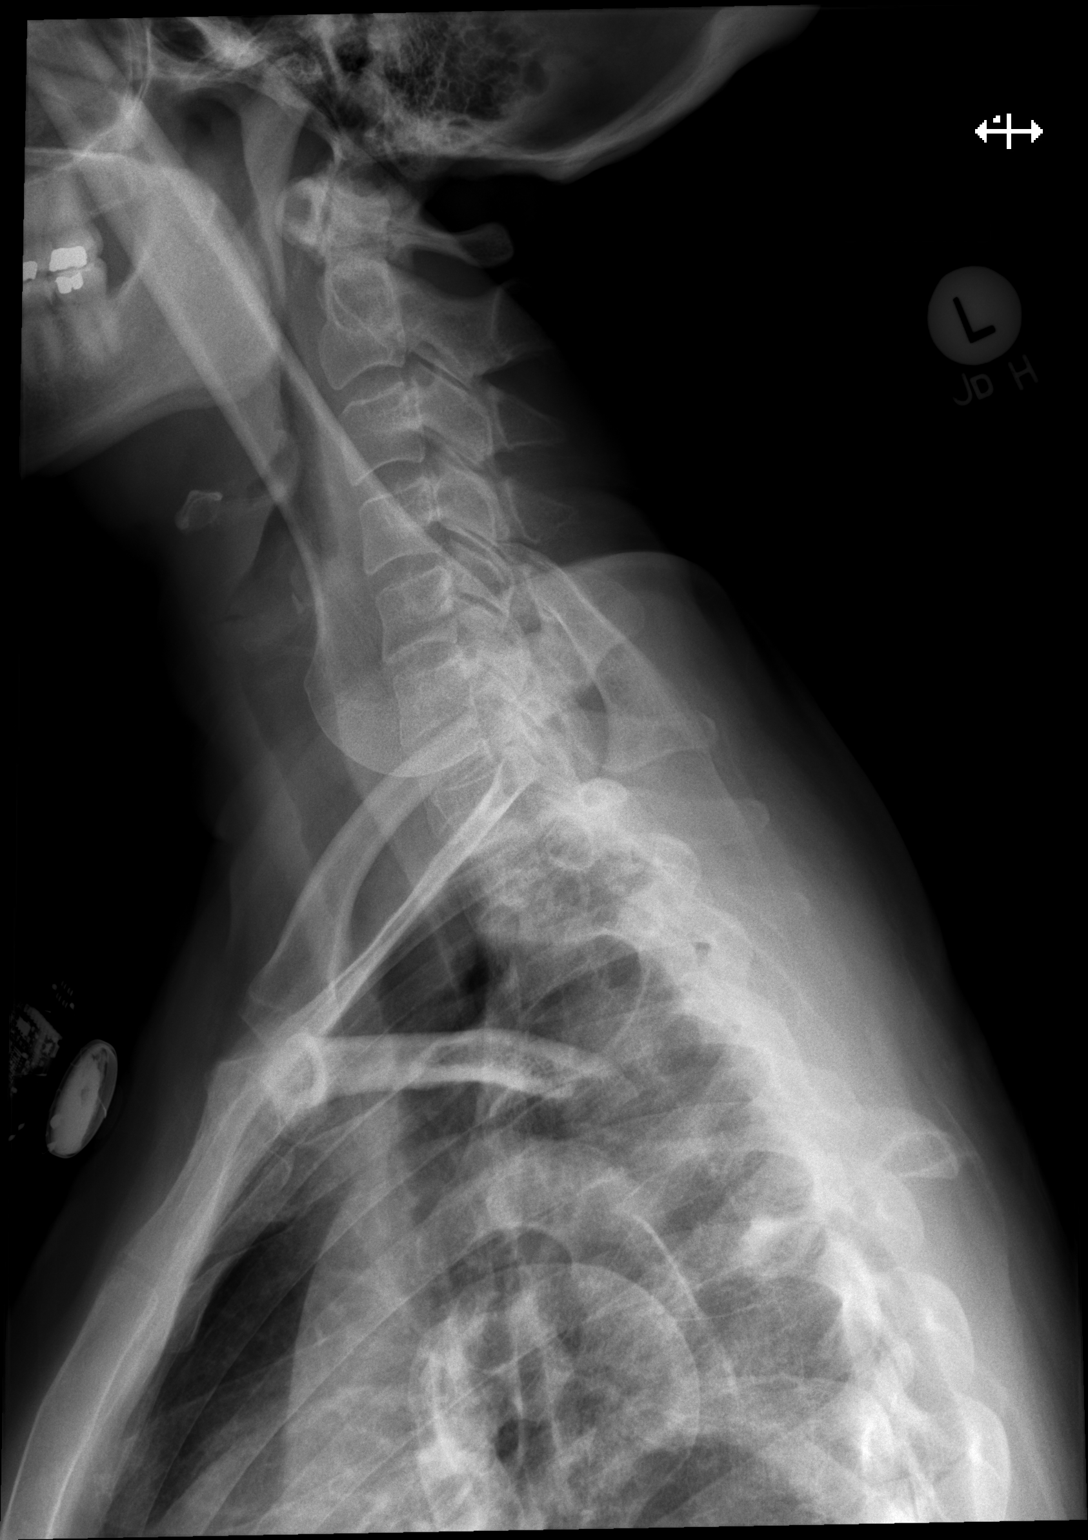

[3 of 3 positions shown; findings below may reference images not displayed]

FINDINGS: No thoracic spine compression deformity is seen. There is mild
curvature of the thoracolumbar spine. No prominent paravertebral
soft tissue is noted. Disc spaces appear normal.
IMPRESSION: No acute abnormality.  Mild curvature of the thoracolumbar spine.

## 2017-11-19 DIAGNOSIS — E782 Mixed hyperlipidemia: Secondary | ICD-10-CM | POA: Diagnosis not present

## 2017-11-19 DIAGNOSIS — N951 Menopausal and female climacteric states: Secondary | ICD-10-CM | POA: Diagnosis not present

## 2017-11-19 DIAGNOSIS — R5383 Other fatigue: Secondary | ICD-10-CM | POA: Diagnosis not present

## 2017-12-04 ENCOUNTER — Other Ambulatory Visit: Payer: Self-pay | Admitting: Orthopedic Surgery

## 2017-12-04 DIAGNOSIS — K648 Other hemorrhoids: Secondary | ICD-10-CM | POA: Diagnosis not present

## 2017-12-05 ENCOUNTER — Other Ambulatory Visit: Payer: Self-pay

## 2017-12-05 ENCOUNTER — Encounter (HOSPITAL_BASED_OUTPATIENT_CLINIC_OR_DEPARTMENT_OTHER): Payer: Self-pay | Admitting: *Deleted

## 2017-12-05 NOTE — H&P (Addendum)
Stacy Watkins is an 57 y.o. female.   CC / Reason for Visit: Right long finger problem HPI: This patient returns clinic today indicating that the injection was quite helpful but not completely resolve the problem of the locking and catching right long finger.  She reports that she would like one more injection.   HPI 08/08/2017: This patient returns to clinic today indicating that she is still having locking catching in her right long finger despite having 2 injections in 2017.  She reports that it is mild, but it does lock and catch mainly in the mornings.  She wishes to have injections to see if it should resolve without surgery.     Past Medical History:  Diagnosis Date  . Anxiety   . Depression   . Depression with anxiety   . Headache   . Seasonal allergies   . Situational stress     Past Surgical History:  Procedure Laterality Date  . CARPAL TUNNEL RELEASE  2016  . CARPAL TUNNEL RELEASE Left 10/11/2014   Procedure: LEFT  ENDOSCOPIC CARPAL TUNNEL RELEASE;  Surgeon: Mack Hook, MD;  Location: [facility];  Service: Orthopedics;  Laterality: Left;  . CESAREAN SECTION    . DILATION AND CURETTAGE OF UTERUS    . EYE SURGERY      Family History  Problem Relation Age of Onset  . Heart failure Mother   . Colon cancer Father   . CAD Father        MI in his 69s  . Heart attack Father   . CAD Sister        Died at age 35 of MI  . Heart attack Sister   . Hypertension Brother    Social History:  reports that she has never smoked. She has never used smokeless tobacco. She reports that she does not drink alcohol or use drugs.  Allergies:  Allergies  Allergen Reactions  . Dust Mite Extract Other (See Comments)  . Mold Extract  [Trichophyton] Other (See Comments)    No medications prior to admission.    No results found for this or any previous visit (from the past 48 hour(s)). No results found.  Review of Systems  All other systems reviewed and are  negative.   Height 5\' 6"  (1.676 m), weight 59.9 kg. Physical Exam  Constitutional:  WD, WN, NAD HEENT:  NCAT, EOMI Neuro/Psych:  Alert & oriented to person, place, and time; appropriate mood & affect Lymphatic: No generalized UE edema or lymphadenopathy Extremities / MSK:  Both UE are normal with respect to appearance, ranges of motion, joint stability, muscle strength/tone, sensation, & perfusion except as otherwise noted:  The right long finger is tender over the A1 pulley with active catching noted today during  flexion cycle  Otherwise, the hands are normal in appearance with full digital range of motion. NVI.  Labs / X-rays:  No radiographic studies obtained today.  Assessment: Right long finger stenosing tenosynovitis, injected 07/11/2015, 12/05/2015, 08/08/2017, 09/05/2017   Plan:   The findings are discussed with the patient.  She wishes to proceed surgically.The details of the operative procedure were discussed with the patient.  Questions were invited and answered.  In addition to the goal of the procedure, the risks of the procedure to include but not limited to bleeding; infection; damage to the nerves or blood vessels that could result in bleeding, numbness, weakness, chronic pain, and the need for additional procedures; stiffness; the need for revision surgery;  and anesthetic risks were reviewed.  No specific outcome was guaranteed or implied.  Informed consent was obtained.  Jodi Marbleavid A Safir Michalec, MD 12/05/2017, 8:13 PM

## 2017-12-09 ENCOUNTER — Ambulatory Visit (HOSPITAL_BASED_OUTPATIENT_CLINIC_OR_DEPARTMENT_OTHER): Payer: 59 | Admitting: Anesthesiology

## 2017-12-09 ENCOUNTER — Encounter (HOSPITAL_BASED_OUTPATIENT_CLINIC_OR_DEPARTMENT_OTHER)
Admission: RE | Disposition: A | Discharge status: Home / Self Care | Payer: Self-pay | Source: Ambulatory Visit | Attending: Orthopedic Surgery

## 2017-12-09 ENCOUNTER — Encounter (HOSPITAL_BASED_OUTPATIENT_CLINIC_OR_DEPARTMENT_OTHER): Payer: Self-pay | Admitting: *Deleted

## 2017-12-09 ENCOUNTER — Ambulatory Visit (HOSPITAL_BASED_OUTPATIENT_CLINIC_OR_DEPARTMENT_OTHER)
Admission: RE | Admit: 2017-12-09 | Discharge: 2017-12-09 | Disposition: A | Discharge status: Home / Self Care | Payer: 59 | Source: Ambulatory Visit | Attending: Orthopedic Surgery | Admitting: Orthopedic Surgery

## 2017-12-09 ENCOUNTER — Other Ambulatory Visit: Payer: Self-pay

## 2017-12-09 DIAGNOSIS — M65331 Trigger finger, right middle finger: Secondary | ICD-10-CM | POA: Insufficient documentation

## 2017-12-09 DIAGNOSIS — E7800 Pure hypercholesterolemia, unspecified: Secondary | ICD-10-CM | POA: Diagnosis not present

## 2017-12-09 HISTORY — PX: TRIGGER FINGER RELEASE: SHX641

## 2017-12-09 SURGERY — RELEASE, A1 PULLEY, FOR TRIGGER FINGER
Anesthesia: Monitor Anesthesia Care | Site: Hand | Laterality: Right

## 2017-12-09 MED ORDER — FENTANYL CITRATE (PF) 100 MCG/2ML IJ SOLN: 25.0000 ug | INTRAMUSCULAR | Status: DC | PRN

## 2017-12-09 MED ORDER — BUPIVACAINE-EPINEPHRINE 0.25% -1:200000 IJ SOLN
INTRAMUSCULAR | Status: AC
  Filled 2017-12-09: qty 1

## 2017-12-09 MED ORDER — BUPIVACAINE-EPINEPHRINE (PF) 0.25% -1:200000 IJ SOLN
INTRAMUSCULAR | Status: AC
  Filled 2017-12-09: qty 30

## 2017-12-09 MED ORDER — BUPIVACAINE-EPINEPHRINE (PF) 0.25% -1:200000 IJ SOLN
INTRAMUSCULAR | Status: DC | PRN
  Administered 2017-12-09: 6 mL via PERINEURAL

## 2017-12-09 MED ORDER — LACTATED RINGERS IV SOLN
INTRAVENOUS | Status: DC
  Administered 2017-12-09: 11:00:00 via INTRAVENOUS

## 2017-12-09 MED ORDER — LIDOCAINE HCL 2 % IJ SOLN
INTRAMUSCULAR | Status: DC | PRN
  Administered 2017-12-09: 6 mL

## 2017-12-09 MED ORDER — MIDAZOLAM HCL 2 MG/2ML IJ SOLN
INTRAMUSCULAR | Status: AC
  Filled 2017-12-09: qty 2

## 2017-12-09 MED ORDER — OXYCODONE HCL 5 MG PO TABS: 5.0000 mg | ORAL_TABLET | Freq: Four times a day (QID) | ORAL | 0 refills | Status: DC | PRN

## 2017-12-09 MED ORDER — ACETAMINOPHEN 325 MG PO TABS: 650.0000 mg | ORAL_TABLET | Freq: Four times a day (QID) | ORAL | Status: DC

## 2017-12-09 MED ORDER — MIDAZOLAM HCL 2 MG/2ML IJ SOLN
1.0000 mg | INTRAMUSCULAR | Status: DC | PRN
  Administered 2017-12-09: 2 mg via INTRAVENOUS

## 2017-12-09 MED ORDER — IBUPROFEN 200 MG PO TABS: 600.0000 mg | ORAL_TABLET | Freq: Four times a day (QID) | ORAL | 0 refills | Status: DC

## 2017-12-09 MED ORDER — FENTANYL CITRATE (PF) 100 MCG/2ML IJ SOLN
INTRAMUSCULAR | Status: AC
  Filled 2017-12-09: qty 2

## 2017-12-09 MED ORDER — CEFAZOLIN SODIUM-DEXTROSE 2-4 GM/100ML-% IV SOLN
INTRAVENOUS | Status: AC
  Filled 2017-12-09: qty 100

## 2017-12-09 MED ORDER — CEFAZOLIN SODIUM-DEXTROSE 2-4 GM/100ML-% IV SOLN
2.0000 g | INTRAVENOUS | Status: AC
  Administered 2017-12-09 (×2): 2 g via INTRAVENOUS

## 2017-12-09 MED ORDER — OXYCODONE HCL 5 MG PO TABS: 5.0000 mg | ORAL_TABLET | Freq: Once | ORAL | Status: DC | PRN

## 2017-12-09 MED ORDER — LIDOCAINE HCL 2 % IJ SOLN
INTRAMUSCULAR | Status: AC
  Filled 2017-12-09: qty 20

## 2017-12-09 MED ORDER — OXYCODONE HCL 5 MG/5ML PO SOLN: 5.0000 mg | Freq: Once | ORAL | Status: DC | PRN

## 2017-12-09 MED ORDER — ONDANSETRON HCL 4 MG/2ML IJ SOLN
INTRAMUSCULAR | Status: DC | PRN
  Administered 2017-12-09: 4 mg via INTRAVENOUS

## 2017-12-09 MED ORDER — ONDANSETRON HCL 4 MG/2ML IJ SOLN: 4.0000 mg | Freq: Once | INTRAMUSCULAR | Status: DC | PRN

## 2017-12-09 MED ORDER — SCOPOLAMINE 1 MG/3DAYS TD PT72: 1.0000 | MEDICATED_PATCH | Freq: Once | TRANSDERMAL | Status: DC | PRN

## 2017-12-09 MED ORDER — FENTANYL CITRATE (PF) 100 MCG/2ML IJ SOLN
50.0000 ug | INTRAMUSCULAR | Status: DC | PRN
  Administered 2017-12-09: 100 ug via INTRAVENOUS

## 2017-12-09 SURGICAL SUPPLY — 39 items
BLADE SURG 15 STRL LF DISP TIS (BLADE) ×1 IMPLANT
BLADE SURG 15 STRL SS (BLADE) ×2
BNDG CMPR 9X4 STRL LF SNTH (GAUZE/BANDAGES/DRESSINGS) ×1
BNDG COHESIVE 1X5 TAN STRL LF (GAUZE/BANDAGES/DRESSINGS) ×2 IMPLANT
BNDG CONFORM 2 STRL LF (GAUZE/BANDAGES/DRESSINGS) ×2 IMPLANT
BNDG ESMARK 4X9 LF (GAUZE/BANDAGES/DRESSINGS) ×2 IMPLANT
CHLORAPREP W/TINT 26ML (MISCELLANEOUS) ×2 IMPLANT
COVER BACK TABLE 60X90IN (DRAPES) ×2 IMPLANT
COVER MAYO STAND STRL (DRAPES) ×2 IMPLANT
COVER WAND RF STERILE (DRAPES) IMPLANT
CUFF TOURNIQUET SINGLE 18IN (TOURNIQUET CUFF) IMPLANT
DRAPE EXTREMITY T 121X128X90 (DRAPE) ×2 IMPLANT
DRAPE SURG 17X23 STRL (DRAPES) ×2 IMPLANT
DRSG EMULSION OIL 3X3 NADH (GAUZE/BANDAGES/DRESSINGS) ×2 IMPLANT
GAUZE SPONGE 4X4 12PLY STRL LF (GAUZE/BANDAGES/DRESSINGS) ×2 IMPLANT
GLOVE BIO SURGEON STRL SZ7.5 (GLOVE) ×2 IMPLANT
GLOVE BIOGEL PI IND STRL 7.0 (GLOVE) ×1 IMPLANT
GLOVE BIOGEL PI IND STRL 8 (GLOVE) ×2 IMPLANT
GLOVE BIOGEL PI INDICATOR 7.0 (GLOVE) ×1
GLOVE BIOGEL PI INDICATOR 8 (GLOVE) ×2
GLOVE ECLIPSE 6.5 STRL STRAW (GLOVE) ×2 IMPLANT
GLOVE SURG SYN 8.0 (GLOVE) ×2 IMPLANT
GOWN STRL REIN XL XLG (GOWN DISPOSABLE) ×2 IMPLANT
GOWN STRL REUS W/ TWL LRG LVL3 (GOWN DISPOSABLE) ×2 IMPLANT
GOWN STRL REUS W/TWL LRG LVL3 (GOWN DISPOSABLE) ×4
GOWN STRL REUS W/TWL XL LVL3 (GOWN DISPOSABLE) ×2 IMPLANT
NDL SAFETY ECLIPSE 18X1.5 (NEEDLE) ×1 IMPLANT
NEEDLE HYPO 18GX1.5 SHARP (NEEDLE) ×1
NEEDLE HYPO 25X1 1.5 SAFETY (NEEDLE) ×2 IMPLANT
NS IRRIG 1000ML POUR BTL (IV SOLUTION) ×2 IMPLANT
PACK BASIN DAY SURGERY FS (CUSTOM PROCEDURE TRAY) ×2 IMPLANT
STOCKINETTE 6  STRL (DRAPES) ×1
STOCKINETTE 6 STRL (DRAPES) ×1 IMPLANT
SUT VICRYL RAPIDE 4/0 PS 2 (SUTURE) IMPLANT
SYR 10ML LL (SYRINGE) IMPLANT
SYR BULB 3OZ (MISCELLANEOUS) ×2 IMPLANT
TOWEL GREEN STERILE FF (TOWEL DISPOSABLE) ×2 IMPLANT
TOWEL OR NON WOVEN STRL DISP B (DISPOSABLE) IMPLANT
UNDERPAD 30X30 (UNDERPADS AND DIAPERS) ×2 IMPLANT

## 2017-12-09 NOTE — Interval H&P Note (Signed)
History and Physical Interval Note:  12/09/2017 11:28 AM  Stacy Watkins  has presented today for surgery, with the diagnosis of RIGHT LONG FINGER TRIGGER DIGIT  The various methods of treatment have been discussed with the patient and family. After consideration of risks, benefits and other options for treatment, the patient has consented to  Procedure(s): RELEASE TRIGGER FINGER/A-1 PULLEY (Right) as a surgical intervention .  The patient's history has been reviewed, patient examined, no change in status, stable for surgery.  I have reviewed the patient's chart and labs.  Questions were answered to the patient's satisfaction.     Jodi Marbleavid A Issac Moure

## 2017-12-09 NOTE — Anesthesia Postprocedure Evaluation (Signed)
Anesthesia Post Note  Patient: Stacy Watkins  Procedure(s) Performed: RELEASE TRIGGER FINGER/A-1 PULLEY (Right Hand)     Patient location during evaluation: PACU Anesthesia Type: MAC Level of consciousness: awake and alert Pain management: pain level controlled Vital Signs Assessment: post-procedure vital signs reviewed and stable Respiratory status: spontaneous breathing, nonlabored ventilation and respiratory function stable Cardiovascular status: blood pressure returned to baseline and stable Postop Assessment: no apparent nausea or vomiting Anesthetic complications: no    Last Vitals:  Vitals:   12/09/17 1300 12/09/17 1315  BP: (!) 147/78 (!) 160/85  Pulse: 84 80  Resp: 18 18  Temp:  36.9 C  SpO2: 100% 100%    Last Pain:  Vitals:   12/09/17 1315  PainSc: 0-No pain                 Lidia Collum

## 2017-12-09 NOTE — Op Note (Signed)
12/09/2017  11:30 AM  PATIENT:  Stacy Watkins  57 y.o. female  PRE-OPERATIVE DIAGNOSIS:  RIGHT LONG FINGER TRIGGER DIGIT  POST-OPERATIVE DIAGNOSIS:  Same  PROCEDURE:  Procedure(s): RELEASE TRIGGER FINGER/A-1 PULLEY  SURGEON:  Surgeon(s): Milly Jakob, MD  PHYSICIAN ASSISTANT: Morley Kos, OPA-C  ANESTHESIA:  Local / MAC  SPECIMENS:  None  DRAINS:   None  EBL:  less than 50 mL  PREOPERATIVE INDICATIONS:  [name] is a  57 y.o. female with a diagnosis of RIGHT LONG FINGER TRIGGER DIGIT who failed conservative measures and elected for surgical management.    The risks benefits and alternatives were discussed with the patient preoperatively including but not limited to the risks of infection, bleeding, nerve injury, cardiopulmonary complications, the need for revision surgery, among others, and the patient verbalized understanding and consented to proceed.  OPERATIVE IMPLANTS: None  OPERATIVE FINDINGS: See Below  OPERATIVE PROCEDURE:  After receiving prophylactic antibiotics, the patient was escorted to the operative theatre and placed in a supine position.   A surgical "time-out" was performed during which the planned procedure, proposed operative site, and the correct patient identity were compared to the operative consent and agreement confirmed by the circulating nurse according to current facility policy.  Digital block was performed with a mixture of lidocaine and Marcaine, bearing epinephrine. Following application of a tourniquet to the operative extremity, the exposed skin was prepped with Chloraprep and draped in the usual sterile fashion.  The limb was exsanguinated with an Esmarch bandage and the tourniquet inflated to approximately 115mHg higher than systolic BP.  An oblique incision was made over the A1 pulley of the affected digit.  Subcutaneous taste tissues were dissected with blunt and spreading dissection to reveal an underlying flexor tendon  sheath and A1 pulley.  With the neurovascular structures protected, the A1 pulley was split in the midline under direct visualization with loupe assistance.  Some crossing bands proximal to the A1 pulley were also released.  The tendons were pulled into view, cleaned of thickened synovium and return to their bed.  The wound was irrigated, tourniquet released, and skin closed with 4-0 Vicryl Rapide.  A light dressing was applied and the patient was taken to the recovery room.  DISPOSITION: The patient will be discharged home today with typical instructions, returning in 10-15 days.

## 2017-12-09 NOTE — Transfer of Care (Signed)
Immediate Anesthesia Transfer of Care Note  Patient: Stacy Watkins  Procedure(s) Performed: RELEASE TRIGGER FINGER/A-1 PULLEY (Right Hand)  Patient Location: PACU  Anesthesia Type:MAC  Level of Consciousness: awake, alert  and oriented  Airway & Oxygen Therapy: Patient Spontanous Breathing and Patient connected to face mask oxygen  Post-op Assessment: Report given to RN and Post -op Vital signs reviewed and stable  Post vital signs: Reviewed and stable  Last Vitals:  Vitals Value Taken Time  BP    Temp    Pulse 82 12/09/2017 12:26 PM  Resp 14 12/09/2017 12:26 PM  SpO2 100 % 12/09/2017 12:26 PM  Vitals shown include unvalidated device data.  Last Pain:  Vitals:   12/09/17 1105  PainSc: 2       Patients Stated Pain Goal: 0 (22/02/54 2706)  Complications: No apparent anesthesia complications

## 2017-12-09 NOTE — Discharge Instructions (Signed)
Discharge Instructions   You have a light dressing on your hand.  You may begin gentle motion of your fingers and hand immediately, but you should not do any heavy lifting or gripping.  Elevate your hand to reduce pain & swelling of the digits.  Ice over the operative site may be helpful to reduce pain & swelling.  DO NOT USE HEAT. Pain medicine has been prescribed for you.  Take Tylenol 650 mg and Ibuprofen 600 mg every 6 hours, together for pain. Take Oxycodone as a rescue medicine in addition to tylenol and ibuprofen if needed. Leave the dressing in place until the third day after your surgery and then remove it, leaving it open to air.  After the bandage has been removed you may shower, regularly washing the incision and letting the water run over it, but not submerging it (no swimming, soaking it in dishwater, etc.) You may drive a car when you are off of prescription pain medications and can safely control your vehicle with both hands. We will address whether therapy will be required or not when you return to the office. You may have already made your follow-up appointment when we completed your preop visit.  If not, please call our office today or the next business day to make your return appointment for 10-15 days after surgery.   Please call [phone] during normal business hours or [phone] after hours for any problems. Including the following:  - excessive redness of the incisions - drainage for more than 4 days - fever of more than 101.5 F  *Please note that pain medications will not be refilled after hours or on weekends.   Post Anesthesia Home Care Instructions  Activity: Get plenty of rest for the remainder of the day. A responsible individual must stay with you for 24 hours following the procedure.  For the next 24 hours, DO NOT: -Drive a car -Advertising copywriterperate machinery -Drink alcoholic beverages -Take any medication unless instructed by your physician -Make any legal  decisions or sign important papers.  Meals: Start with liquid foods such as gelatin or soup. Progress to regular foods as tolerated. Avoid greasy, spicy, heavy foods. If nausea and/or vomiting occur, drink only clear liquids until the nausea and/or vomiting subsides. Call your physician if vomiting continues.  Special Instructions/Symptoms: Your throat may feel dry or sore from the anesthesia or the breathing tube placed in your throat during surgery. If this causes discomfort, gargle with warm salt water. The discomfort should disappear within 24 hours.  If you had a scopolamine patch placed behind your ear for the management of post- operative nausea and/or vomiting:  1. The medication in the patch is effective for 72 hours, after which it should be removed.  Wrap patch in a tissue and discard in the trash. Wash hands thoroughly with soap and water. 2. You may remove the patch earlier than 72 hours if you experience unpleasant side effects which may include dry mouth, dizziness or visual disturbances. 3. Avoid touching the patch. Wash your hands with soap and water after contact with the patch.

## 2017-12-09 NOTE — Anesthesia Preprocedure Evaluation (Signed)
Anesthesia Evaluation  Patient identified by MRN, date of birth, ID band Patient awake    Reviewed: Allergy & Precautions, NPO status , Patient's Chart, lab work & pertinent test results  History of Anesthesia Complications Negative for: history of anesthetic complications  Airway Mallampati: II  TM Distance: >3 FB Neck ROM: Full    Dental no notable dental hx.    Pulmonary neg pulmonary ROS,    Pulmonary exam normal        Cardiovascular negative cardio ROS Normal cardiovascular exam     Neuro/Psych negative neurological ROS  negative psych ROS   GI/Hepatic negative GI ROS, Neg liver ROS,   Endo/Other  negative endocrine ROS  Renal/GU negative Renal ROS  negative genitourinary   Musculoskeletal negative musculoskeletal ROS (+)   Abdominal   Peds  Hematology negative hematology ROS (+)   Anesthesia Other Findings   Reproductive/Obstetrics                             Anesthesia Physical Anesthesia Plan  ASA: I  Anesthesia Plan: MAC   Post-op Pain Management:    Induction:   PONV Risk Score and Plan: 2 and Propofol infusion, Treatment may vary due to age or medical condition and Midazolam  Airway Management Planned: Nasal Cannula and Simple Face Mask  Additional Equipment: None  Intra-op Plan:   Post-operative Plan:   Informed Consent: I have reviewed the patients History and Physical, chart, labs and discussed the procedure including the risks, benefits and alternatives for the proposed anesthesia with the patient or authorized representative who has indicated his/her understanding and acceptance.     Plan Discussed with:   Anesthesia Plan Comments:         Anesthesia Quick Evaluation

## 2017-12-10 ENCOUNTER — Encounter (HOSPITAL_BASED_OUTPATIENT_CLINIC_OR_DEPARTMENT_OTHER): Payer: Self-pay | Admitting: Orthopedic Surgery

## 2017-12-20 DIAGNOSIS — Z01419 Encounter for gynecological examination (general) (routine) without abnormal findings: Secondary | ICD-10-CM | POA: Diagnosis not present

## 2017-12-20 DIAGNOSIS — Z6821 Body mass index (BMI) 21.0-21.9, adult: Secondary | ICD-10-CM | POA: Diagnosis not present

## 2018-01-02 DIAGNOSIS — R6883 Chills (without fever): Secondary | ICD-10-CM | POA: Diagnosis not present

## 2018-01-02 DIAGNOSIS — R509 Fever, unspecified: Secondary | ICD-10-CM | POA: Diagnosis not present

## 2018-01-02 DIAGNOSIS — J029 Acute pharyngitis, unspecified: Secondary | ICD-10-CM | POA: Diagnosis not present

## 2018-01-22 DIAGNOSIS — K648 Other hemorrhoids: Secondary | ICD-10-CM | POA: Diagnosis not present

## 2018-01-28 DIAGNOSIS — H6592 Unspecified nonsuppurative otitis media, left ear: Secondary | ICD-10-CM | POA: Diagnosis not present

## 2018-01-28 DIAGNOSIS — J40 Bronchitis, not specified as acute or chronic: Secondary | ICD-10-CM | POA: Diagnosis not present

## 2018-02-05 DIAGNOSIS — K648 Other hemorrhoids: Secondary | ICD-10-CM | POA: Diagnosis not present

## 2018-02-26 IMAGING — CT CT PARANASAL SINUSES LIMITED
1 of 2 series · 8 of 11 positions shown, 10 images · non-contrast
Comparison: None.

CLINICAL DATA: Upper airway congestion and cough. Recurrent
sinusitis.

EXAM:
CT PARANASAL SINUS LIMITED WITHOUT CONTRAST
TECHNIQUE: Non-contiguous multidetector CT images of the paranasal sinuses were
obtained in a single plane without contrast.

[Series 4: limited sinus st · axial · 0.27mm/px · z∈[+136,+206]mm · 8 of 10 slices shown, 10 images]
[im 2/10  brain]
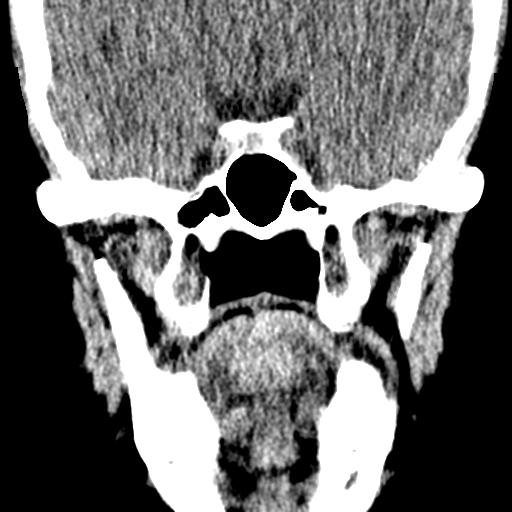
[im 2/10  bone]
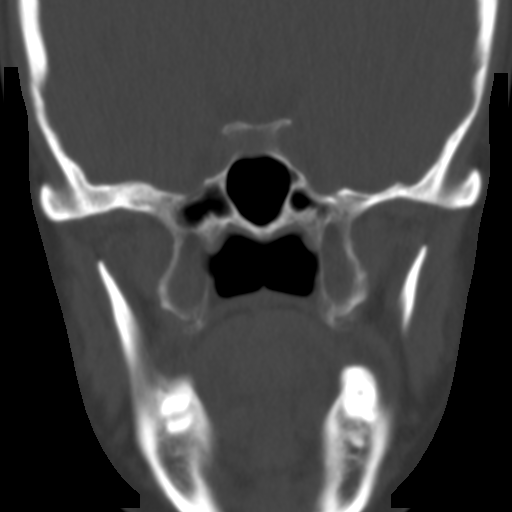
[im 3/10  bone]
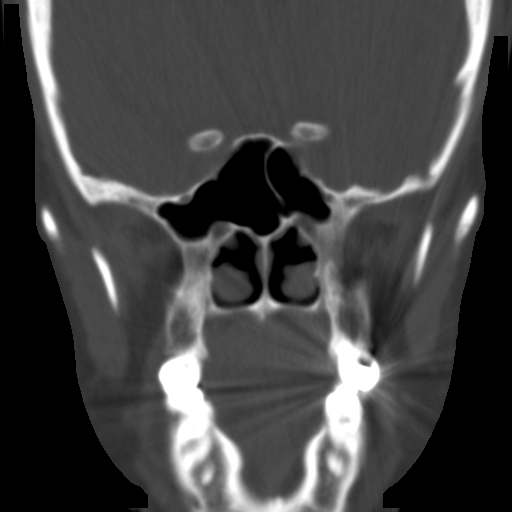
[im 4/10  bone]
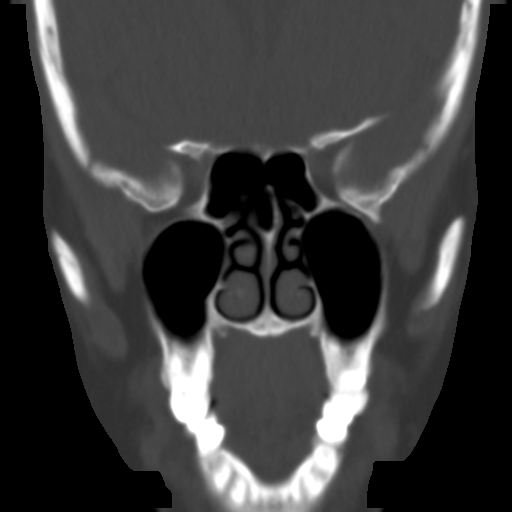
[im 5/10  bone]
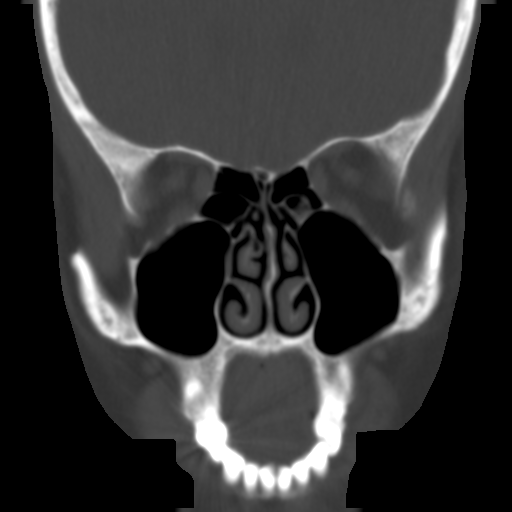
[im 6/10  brain]
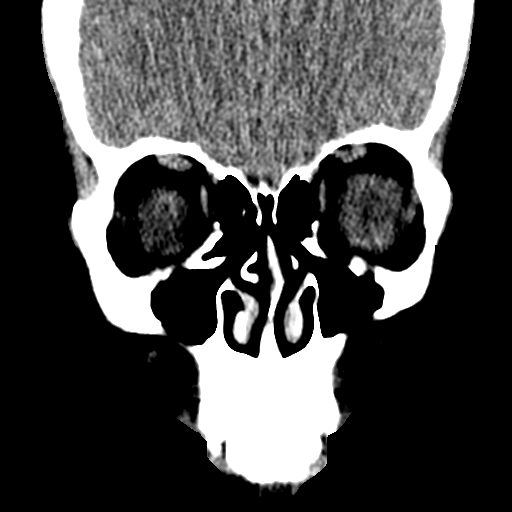
[im 6/10  bone]
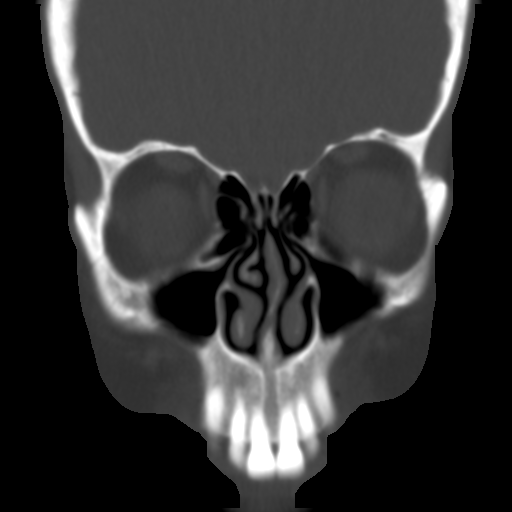
[im 7/10  bone]
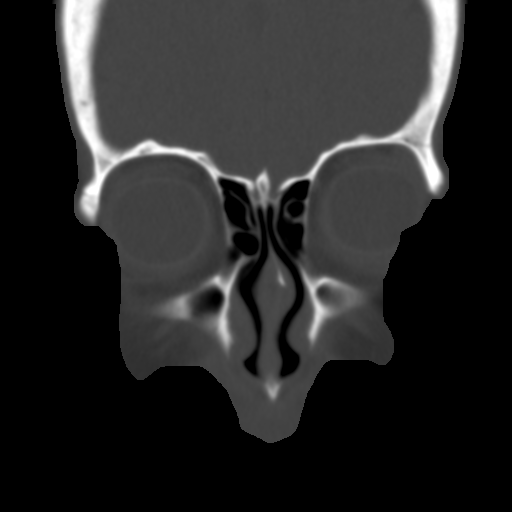
[im 8/10  bone]
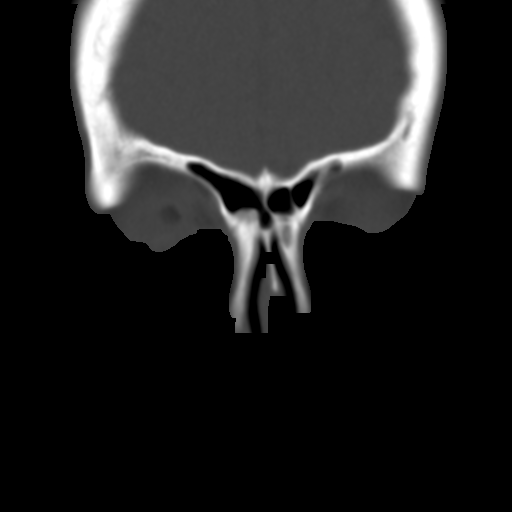
[im 9/10  bone]
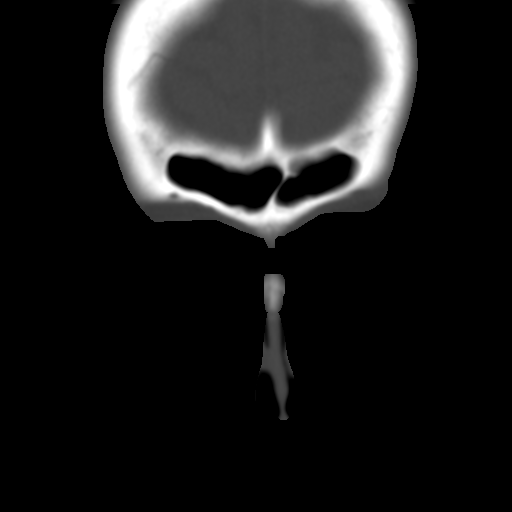

[8 of 11 positions shown; findings below may reference images not displayed]

FINDINGS: Frontal, ethmoid, maxillary and sphenoid sinuses as sampled are
clear. No fluid, mucosal thickening, polyp, cyst or mass. Nasal
septum bows towards the right.
IMPRESSION: Negative for sinus inflammatory changes either acute or chronic.

## 2018-03-07 DIAGNOSIS — R5383 Other fatigue: Secondary | ICD-10-CM | POA: Diagnosis not present

## 2018-03-07 DIAGNOSIS — Z7689 Persons encountering health services in other specified circumstances: Secondary | ICD-10-CM | POA: Diagnosis not present

## 2018-03-11 DIAGNOSIS — M65331 Trigger finger, right middle finger: Secondary | ICD-10-CM | POA: Diagnosis not present

## 2018-05-16 DIAGNOSIS — K1370 Unspecified lesions of oral mucosa: Secondary | ICD-10-CM | POA: Diagnosis not present

## 2018-05-16 DIAGNOSIS — R5383 Other fatigue: Secondary | ICD-10-CM | POA: Diagnosis not present

## 2019-04-30 ENCOUNTER — Emergency Department (HOSPITAL_COMMUNITY)
Admission: EM | Admit: 2019-04-30 | Discharge: 2019-04-30 | Disposition: A | Discharge status: Home / Self Care | Payer: 59 | Attending: Emergency Medicine | Admitting: Emergency Medicine

## 2019-04-30 ENCOUNTER — Other Ambulatory Visit: Payer: Self-pay

## 2019-04-30 ENCOUNTER — Telehealth: Payer: Self-pay | Admitting: Cardiology

## 2019-04-30 ENCOUNTER — Emergency Department (HOSPITAL_COMMUNITY): Payer: 59

## 2019-04-30 ENCOUNTER — Encounter (HOSPITAL_COMMUNITY): Payer: Self-pay | Admitting: Pediatrics

## 2019-04-30 DIAGNOSIS — R079 Chest pain, unspecified: Secondary | ICD-10-CM

## 2019-04-30 DIAGNOSIS — Z79899 Other long term (current) drug therapy: Secondary | ICD-10-CM | POA: Insufficient documentation

## 2019-04-30 DIAGNOSIS — R0789 Other chest pain: Secondary | ICD-10-CM | POA: Insufficient documentation

## 2019-04-30 LAB — BASIC METABOLIC PANEL
Anion gap: 7 (ref 5–15)
BUN: 12 mg/dL (ref 6–20)
CO2: 28 mmol/L (ref 22–32)
Calcium: 9 mg/dL (ref 8.9–10.3)
Chloride: 104 mmol/L (ref 98–111)
Creatinine, Ser: 0.8 mg/dL (ref 0.44–1.00)
GFR calc Af Amer: >60 mL/min (ref >60)
GFR calc non Af Amer: >60 mL/min (ref >60)
Glucose, Bld: 110 mg/dL — ABNORMAL HIGH (ref 70–99)
Potassium: 4.2 mmol/L (ref 3.5–5.1)
Sodium: 139 mmol/L (ref 135–145)

## 2019-04-30 LAB — CBC
HCT: 41 % (ref 36.0–46.0)
Hemoglobin: 13.8 g/dL (ref 12.0–15.0)
MCH: 31.2 pg (ref 26.0–34.0)
MCHC: 33.7 g/dL (ref 30.0–36.0)
MCV: 92.8 fL (ref 80.0–100.0)
Platelets: 128 10*3/uL — ABNORMAL LOW (ref 150–400)
RBC: 4.42 MIL/uL (ref 3.87–5.11)
RDW: 13 % (ref 11.5–15.5)
WBC: 2.7 10*3/uL — ABNORMAL LOW (ref 4.0–10.5)
nRBC: 0 % (ref 0.0–0.2)

## 2019-04-30 LAB — TROPONIN I (HIGH SENSITIVITY)
Troponin I (High Sensitivity): 3 ng/L (ref <18)
Troponin I (High Sensitivity): <2 ng/L

## 2019-04-30 MED ORDER — SODIUM CHLORIDE 0.9% FLUSH: 3.0000 mL | Freq: Once | INTRAVENOUS | Status: DC

## 2019-04-30 NOTE — Telephone Encounter (Signed)
Spoke with pt who states she is having active chest "pressure and heaviness, off and on for a few weeks"  She denies other Sx such as SOB, dizziness, N&V or sweating.  Pt is asking for an appointment to see Dr Jens Som.  Advised pt d/t her having active CP she should go to ED for further evaluation at this time.    Pt verbalizes understanding and is agreeable to current plan.

## 2019-04-30 NOTE — Discharge Instructions (Addendum)
Future Appointments  Date Time Provider Department Center  05/06/2019 11:00 AM O'Neal, Ronnald Ramp, MD [facility]   Please go to cardiology appointment as scheduled on May 5.  If in the meantime you have worsening chest pain, difficulty breathing or other new concerning symptom, please return to ER for reassessment.

## 2019-04-30 NOTE — ED Provider Notes (Signed)
[facility] EMERGENCY DEPARTMENT Provider Note   CSN: [id number] Arrival date & time: 04/30/19  1153     History Chief Complaint  Patient presents with  . Chest Pain    Stacy Watkins is a 59 y.o. female.  Presenting to ER with concern for chest pain.  Over the past few weeks she has been having intermittent episodes of chest pain.  Pain generally occurs at rest but has occurred during exercise as well.  Last for few minutes at a time, mild to moderate in severity, central, discomfort.  Nonradiating.  States that she exercised a couple times this week and did not have any symptoms during exercise this week.  This morning had additional episode, called cardiology office and they recommended coming to ER for further eval.  Currently no ongoing symptoms.  No history of hypertension, hyperlipidemia, diabetes, smoking.  Does have notable family history of CAD, father and sibling with CAD in 32s.  HPI     Past Medical History:  Diagnosis Date  . Anxiety   . Depression   . Depression with anxiety   . Headache   . Seasonal allergies   . Situational stress     Patient Active Problem List   Diagnosis Date Noted  . Paresthesia of upper and lower extremity 04/26/2017  . 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 Upper airway cough syndrome 07/19/2016  . Chest pain 03/19/2016  . Tachycardia 03/13/2016  . Headache   . Chest pressure   . PURE HYPERCHOLESTEROLEMIA 01/24/2009  . CAROTID STENOSIS 12/10/2008  . ANXIETY DEPRESSION 11/29/2008  . ANXIETY, SITUATIONAL 11/29/2008  . UNSPECIFIED CARDIOVASCULAR DISEASE 11/29/2008    Past Surgical History:  Procedure Laterality Date  . CARPAL TUNNEL RELEASE  2016  . CARPAL TUNNEL RELEASE Left 10/11/2014   Procedure: LEFT  ENDOSCOPIC CARPAL TUNNEL RELEASE;  Surgeon: Mack Hook, MD;  Location: [facility];  Service: Orthopedics;  Laterality: Left;  . CESAREAN SECTION    . CHOLECYSTECTOMY    . DILATION AND  CURETTAGE OF UTERUS    . EYE SURGERY    . TRIGGER FINGER RELEASE Right 12/09/2017   Procedure: RELEASE TRIGGER FINGER/A-1 PULLEY;  Surgeon: Mack Hook, MD;  Location:  SURGERY CENTER;  Service: Orthopedics;  Laterality: Right;     OB History   No obstetric history on file.     Family History  Problem Relation Age of Onset  . Heart failure Mother   . Colon cancer Father   . CAD Father        MI in his 76s  . Heart attack Father   . CAD Sister        Died at age 68 of MI  . Heart attack Sister   . Hypertension Brother     Social History   Tobacco Use  . Smoking status: Never Smoker  . Smokeless tobacco: Never Used  Substance Use Topics  . Alcohol use: No  . Drug use: No    Home Medications Prior to Admission medications   Medication Sig Start Date End Date Taking? Authorizing Provider  acetaminophen (TYLENOL) 325 MG tablet Take 2 tablets (650 mg total) by mouth every 6 (six) hours. 12/09/17   Mack Hook, MD  aspirin 81 MG tablet Take 81 mg by mouth daily.    [provider]  atenolol (TENORMIN) 100 MG tablet Take 100 mg by mouth daily.     [provider]  butalbital-acetaminophen-caffeine (FIORICET, ESGIC) [phone] MG tablet  Take 1 tablet by mouth every 6 (six) hours as needed for migraine.  08/30/15   [provider]  CALCIUM PO Take by mouth.    [provider]  cetirizine (ZYRTEC) 10 MG tablet Take by mouth.    [provider]  Cholecalciferol (VITAMIN D PO) Take by mouth.    [provider]  ibuprofen (ADVIL) 200 MG tablet Take 3 tablets (600 mg total) by mouth every 6 (six) hours. 12/09/17   Mack Hook, MD  Morphine-Naltrexone 20-0.8 MG CPCR Take 1 tablet by mouth daily.    [provider]  MULTIPLE VITAMIN PO Take 1 tablet by mouth daily.     [provider]  NON FORMULARY 75 mg. Pregnenolone    [provider]  nortriptyline (PAMELOR) 50 MG capsule Take 50 mg by  mouth daily.     [provider]  Omega-3 Fatty Acids (FISH OIL PO) Take by mouth.    [provider]  oxyCODONE (ROXICODONE) 5 MG immediate release tablet Take 1 tablet (5 mg total) by mouth every 6 (six) hours as needed for breakthrough pain. 12/09/17   Mack Hook, MD  Probiotic Product (ALIGN PO) Take 1 capsule by mouth daily.    [provider]  rizatriptan (MAXALT) 10 MG tablet Take 10 mg by mouth as needed for migraine. May repeat in 2 hours if needed    [provider]  VITAMIN A PO Take by mouth.    [provider]  VITAMIN K PO Take by mouth.    [provider]  zolpidem (AMBIEN) 10 MG tablet Take 5 mg by mouth at bedtime as needed for sleep.     [provider]    Allergies    Dust mite extract and Mold extract  [trichophyton]  Review of Systems   Review of Systems  Constitutional: Negative for chills and fever.  HENT: Negative for ear pain and sore throat.   Eyes: Negative for pain and visual disturbance.  Respiratory: Positive for chest tightness. Negative for cough and shortness of breath.   Cardiovascular: Positive for chest pain. Negative for palpitations.  Gastrointestinal: Negative for abdominal pain and vomiting.  Genitourinary: Negative for dysuria and hematuria.  Musculoskeletal: Negative for arthralgias and back pain.  Skin: Negative for color change and rash.  Neurological: Negative for seizures and syncope.  All other systems reviewed and are negative.   Physical Exam Updated Vital Signs BP 135/75   Pulse 77   Temp 98.3 F (36.8 C) (Oral)   Resp 12   Ht 5\' 6"  (1.676 m)   Wt 60.5 kg   LMP 09/24/2012   SpO2 98%   BMI 21.53 kg/m   Physical Exam Vitals and nursing note reviewed.  Constitutional:      General: She is not in acute distress.    Appearance: She is well-developed.  HENT:     Head: Normocephalic and atraumatic.  Eyes:     Conjunctiva/sclera: Conjunctivae normal.    Cardiovascular:     Rate and Rhythm: Normal rate and regular rhythm.     Heart sounds: No murmur.  Pulmonary:     Effort: Pulmonary effort is normal. No respiratory distress.     Breath sounds: Normal breath sounds.  Chest:     Chest wall: No mass.  Abdominal:     Palpations: Abdomen is soft.     Tenderness: There is no abdominal tenderness.  Musculoskeletal:        General: Normal range of  motion.     Cervical back: Neck supple.     Right lower leg: No edema.     Left lower leg: No edema.  Skin:    General: Skin is warm and dry.  Neurological:     Mental Status: She is alert.     ED Results / Procedures / Treatments   Labs (all labs ordered are listed, but only abnormal results are displayed) Labs Reviewed  BASIC METABOLIC PANEL - Abnormal; Notable for the following components:      Result Value   Glucose, Bld 110 (*)    All other components within normal limits  CBC - Abnormal; Notable for the following components:   WBC 2.7 (*)    Platelets 128 (*)    All other components within normal limits  TROPONIN I (HIGH SENSITIVITY)  TROPONIN I (HIGH SENSITIVITY)    EKG EKG Interpretation  Date/Time:  Thursday April 30 2019 12:03:43 EDT Ventricular Rate:  86 PR Interval:  142 QRS Duration: 76 QT Interval:  352 QTC Calculation: 421 R Axis:   83 Text Interpretation: Normal sinus rhythm Nonspecific ST abnormality Abnormal ECG Confirmed by Madalyn Rob [phone]) on 04/30/2019 4:13:15 PM   Radiology DG Chest 2 View  Result Date: 04/30/2019 CLINICAL DATA:  Chest pain. EXAM: CHEST - 2 VIEW COMPARISON:  August 29, 2016. FINDINGS: The heart size and mediastinal contours are within normal limits. Both lungs are clear. No pneumothorax or pleural effusion is noted. The visualized skeletal structures are unremarkable. IMPRESSION: No active cardiopulmonary disease. Electronically Signed   By: Marijo Conception M.D.   On: 04/30/2019 12:35    Procedures Procedures (including  critical care time)  Medications Ordered in ED Medications - No data to display  ED Course  I have reviewed the triage vital signs and the nursing notes.  Pertinent labs & imaging results that were available during my care of the patient were reviewed by me and considered in my medical decision making (see chart for details).  Clinical Course as of Apr 30 1341  Thu Apr 30, 2019  1635 D/w case with cardiology on call - he will work on getting close f/u set up to see in office   [RD]  [place] Wednesday 5/5 11:00AM [facility]    [RD]    Clinical Course User Index [RD] Lucrezia Starch, MD   MDM Rules/Calculators/A&P         HEART Score: 2             59 year old lady presented to ER with chest pain.  Intermittent episodes for about a month.  Relatively healthy but does have a strong family history of coronary artery disease.  Due to this family history she has previously been seen by cardiology and had a prior stress test that was negative a couple years ago.  In ER today, she is well-appearing, no ongoing pain.  EKG without ischemic changes, troponin x2 within normal limits, has for very low suspicion for ACS.  CXR negative.  No dyspnea, no hypoxia or tachycardia, doubt PE.  Given current clinical status and work up today, believe patient can be managed in the outpatient setting.  Given the strong family history, believe patient would benefit from close out patient follow up. I reviewed with cards attending on call who was able to schedule a close f/u appt for pt next week.    After the discussed management above, the patient was determined to be safe for discharge.  The patient  was in agreement with this plan and all questions regarding their care were answered.  ED return precautions were discussed and the patient will return to the ED with any significant worsening of condition.  Final Clinical Impression(s) / ED Diagnoses Final diagnoses:  Chest pain, unspecified type    Rx  / DC Orders ED Discharge Orders    None       Milagros Loll, MD 05/01/19 1343

## 2019-04-30 NOTE — ED Triage Notes (Signed)
C/o chest discomfort in central chest and tightness on the backside; stated on and off in nature; patient endorsed family history of CHF.

## 2019-04-30 NOTE — Telephone Encounter (Signed)
Pt c/o of Chest Pain: STAT if CP now or developed within 24 hours  1. Are you having CP right now? A little bit   2. Are you experiencing any other symptoms (ex. SOB, nausea, vomiting, sweating)? No   3. How long have you been experiencing CP? A few weeks off and on   4. Is your CP continuous or coming and going? Coming and going   5. Have you taken Nitroglycerin? No    Stacy Watkins is calling stating she has been experiencing chest discomfort for a few weeks off and on. She states no other symptoms are present when it occurs and she can go days at a time without experiencing it. She has not taken any Nitroglycerin in regards to it, but would like to come in and be seen for it. Stacy Watkins is considered a New Patient of [facility] as of 20 days ago due to her not being seen in the past three years so I did not schedule an appointment for the patient. Please advise.   ?

## 2019-04-30 NOTE — ED Notes (Signed)
Patient verbalizes understanding of discharge instructions. Opportunity for questioning and answers were provided. Armband removed by staff, pt discharged from ED to home via POV  

## 2019-05-03 NOTE — Progress Notes (Signed)
Cardiology Office Note:   Date:  05/06/2019  NAME:  Stacy Watkins    MRN: [id number] DOB:  30-May-1960   PCP:  Merri Brunette, MD  Cardiologist:  No primary care provider on file.   Referring MD: Merri Brunette, MD   Chief Complaint  Patient presents with  . Chest Pain   History of Present Illness:   Stacy Watkins is a 59 y.o. female with a hx of depression who is being seen today for the evaluation of chest pain at the request of Merri Brunette, MD. Evaluated in the ER 04/30/2019. Troponin negative x 2. EKG without acute changes. Follow-up today.   She reports for the last 2 to 3 months has had intermittent episodes of chest pressure.  She reports she gets heaviness in her chest and can last minutes to hours.  There is no real identifiable trigger.  It can occur with rest or at with exertion.  It goes away on its own without any alleviating factor.  She does not do anything specifically to make it go away.  CVD risk factors include hyperlipidemia.  She does present with her lipid profile from her primary care physician which shows a low HDL, 33.  Most recent LDL 99, triglycerides 161, total cholesterol 160.  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  She reports no unusual stress.  She reports the pain is not associate with shortness of breath, palpitations or lower extremity edema.  Despite this intermittent episodes of pain she has she does maintain a high level of activity, walking up to 3 miles per day.  She does report history of heart disease in her sister as well as father.  This is alarming to her and concerning.  She did have a stress test in 2018 that was normal.  This was a nuclear medicine stress test and she also had an echocardiogram which was normal as well.  Her cardiovascular examination is benign.  Her EKG demonstrates normal sinus rhythm without any acute changes and is quite normal.  She is a never smoker.  She is a mother of 5  children.  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  No injury stress in her life.  No excess alcohol consumption reported.  Echo 2018-> 60-65% no WMA NM Stress 2018 -> normal MPI  Past Medical History: Past Medical History:  Diagnosis Date  . Anxiety   . Depression   . Depression with anxiety   . Headache   . Seasonal allergies   . Situational stress     Past Surgical History: Past Surgical History:  Procedure Laterality Date  . CARPAL TUNNEL RELEASE  2016  . CARPAL TUNNEL RELEASE Left 10/11/2014   Procedure: LEFT  ENDOSCOPIC CARPAL TUNNEL RELEASE;  Surgeon: Mack Hook, MD;  Location: [facility];  Service: Orthopedics;  Laterality: Left;  . CESAREAN SECTION    . CHOLECYSTECTOMY    . DILATION AND CURETTAGE OF UTERUS    . EYE SURGERY    . TRIGGER FINGER RELEASE Right 12/09/2017   Procedure: RELEASE TRIGGER FINGER/A-1 PULLEY;  Surgeon: Mack Hook, MD;  Location: [facility];  Service: Orthopedics;  Laterality: Right;    Current Medications: Current Meds  Medication Sig  . acetaminophen (TYLENOL) 325 MG tablet Take 2 tablets (650 mg total) by mouth every 6 (six) hours.  Marland Kitchen aspirin 81 MG tablet Take 81 mg by mouth daily.  Marland Kitchen atenolol (TENORMIN)  50 MG tablet Take 50 mg by mouth daily.   . butalbital-acetaminophen-caffeine (FIORICET, ESGIC) 50-325-40 MG tablet Take 1 tablet by mouth every 6 (six) hours as needed for migraine.   Marland Kitchen. CALCIUM PO Take by mouth.  . cetirizine (ZYRTEC) 10 MG tablet Take by mouth.  . Cholecalciferol (VITAMIN D PO) Take by mouth.  Marland Kitchen. ibuprofen (ADVIL) 200 MG tablet Take 3 tablets (600 mg total) by mouth every 6 (six) hours.  . Morphine-Naltrexone 20-0.8 MG CPCR Take 1 tablet by mouth daily.  . MULTIPLE VITAMIN PO Take 1 tablet by mouth daily.   . NON FORMULARY 75 mg. Pregnenolone  . nortriptyline (PAMELOR) 50 MG capsule Take 50 mg by mouth daily.   . Omega-3 Fatty Acids (FISH OIL PO) Take by mouth.  . oxyCODONE  (ROXICODONE) 5 MG immediate release tablet Take 1 tablet (5 mg total) by mouth every 6 (six) hours as needed for breakthrough pain.  . Probiotic Product (ALIGN PO) Take 1 capsule by mouth daily.  . rizatriptan (MAXALT) 10 MG tablet Take 10 mg by mouth as needed for migraine. May repeat in 2 hours if needed  . VITAMIN A PO Take by mouth.  Marland Kitchen. VITAMIN K PO Take by mouth.  . zolpidem (AMBIEN) 10 MG tablet Take 5 mg by mouth at bedtime as needed for sleep.      Allergies:    Dust mite extract and Mold extract  [trichophyton]   Social History: Social History   Socioeconomic History  . Marital status: Married    Spouse name: Not on file  . Number of children: 5  . Years of education: 5316  . Highest education level: Not on file  Occupational History  . Not on file  Tobacco Use  . Smoking status: Never Smoker  . Smokeless tobacco: Never Used  Substance and Sexual Activity  . Alcohol use: No  . Drug use: No  . Sexual activity: Not on file  Other Topics Concern  . Not on file  Social History Narrative   Lives with husband in a one story home.  Has 5 children (2 biological and 3 adopted).  Works as a Clinical research associatewriter.  Education: college.    Social Determinants of Health   Financial Resource Strain:   . Difficulty of Paying Living Expenses:   Food Insecurity:   . Worried About Programme researcher, broadcasting/film/videounning Out of Food in the Last Year:   . Baristaan Out of Food in the Last Year:   Transportation Needs:   . Freight forwarderLack of Transportation (Medical):   Marland Kitchen. Lack of Transportation (Non-Medical):   Physical Activity:   . Days of Exercise per Week:   . Minutes of Exercise per Session:   Stress:   . Feeling of Stress :   Social Connections:   . Frequency of Communication with Friends and Family:   . Frequency of Social Gatherings with Friends and Family:   . Attends Religious Services:   . Active Member of Clubs or Organizations:   . Attends BankerClub or Organization Meetings:   Marland Kitchen. Marital Status:      Family History: The patient's  family history includes CAD in her father and sister; Colon cancer in her father; Heart attack in her father and sister; Heart failure in her mother; Hypertension in her brother.  ROS:   All other ROS reviewed and negative. Pertinent positives noted in the HPI.     EKGs/Labs/Other Studies Reviewed:   The following studies were personally reviewed by me today:  EKG:  EKG is ordered today.  The ekg ordered today demonstrates normal sinus rhythm, heart rate 93, no acute ST-T changes, no evidence of prior infarction, and was personally reviewed by me.   Recent Labs: 04/30/2019: BUN 12; Creatinine, Ser 0.80; Hemoglobin 13.8; Platelets 128; Potassium 4.2; Sodium 139   Recent Lipid Panel    Component Value Date/Time   CHOL 134 04/23/2013 0808   TRIG 89.0 04/23/2013 0808   HDL 43.20 04/23/2013 0808   CHOLHDL 3 04/23/2013 0808   VLDL 17.8 04/23/2013 0808   LDLCALC 73 04/23/2013 0808    Physical Exam:   VS:  BP (!) 142/68   Pulse 93   Ht 5\' 6"  (1.676 m)   Wt 133 lb 6.4 oz (60.5 kg)   LMP 09/24/2012   BMI 21.53 kg/m    Wt Readings from Last 3 Encounters:  05/06/19 133 lb 6.4 oz (60.5 kg)  04/30/19 133 lb 6.1 oz (60.5 kg)  12/09/17 133 lb 6.1 oz (60.5 kg)    General: Well nourished, well developed, in no acute distress Heart: Atraumatic, normal size  Eyes: PEERLA, EOMI  Neck: Supple, no JVD Endocrine: No thryomegaly Cardiac: Normal S1, S2; RRR; no murmurs, rubs, or gallops Lungs: Clear to auscultation bilaterally, no wheezing, rhonchi or rales  Abd: Soft, nontender, no hepatomegaly  Ext: No edema, pulses 2+ Musculoskeletal: No deformities, BUE and BLE strength normal and equal Skin: Warm and dry, no rashes   Neuro: Alert and oriented to person, place, time, and situation, CNII-XII grossly intact, no focal deficits  Psych: Normal mood and affect   ASSESSMENT:   TALISA PETRAK is a 59 y.o. female who presents for the following: 1. Chest pain, unspecified type     PLAN:   1.  Chest pain, unspecified type -Atypical chest pain.  EKG normal without any ischemic changes or evidence of prior infarction.  Recent visit to the hospital showed normal cardiac enzymes.  Overall the pain is very atypical and likely noncardiac.  She had a normal Lexiscan nuclear medicine stress test in 2018 and a normal echocardiogram.  Her cardiovascular examination is benign I hear no murmurs rubs or gallops.  She has no evidence of heart failure examination.  I have recommended a coronary CTA to further define her coronary anatomy.  She may have some plaque that was missed on her Lexiscan.  She is okay to do this.  No need for a BMP as this was recently obtained.  She will take metoprolol tartrate 100 mg 2-hour before the scan.  We will plan to follow-up the results with her by phone and should she need more follow-up we will let her know that.  She does see Dr. 2019 and she may choose to follow with him in the future.  Disposition: Return if symptoms worsen or fail to improve.  Medication Adjustments/Labs and Tests Ordered: Current medicines are reviewed at length with the patient today.  Concerns regarding medicines are outlined above.  Orders Placed This Encounter  Procedures  . CT CORONARY MORPH W/CTA COR W/SCORE W/CA W/CM &/OR WO/CM  . CT CORONARY FRACTIONAL FLOW RESERVE DATA PREP  . CT CORONARY FRACTIONAL FLOW RESERVE FLUID ANALYSIS  . EKG 12-Lead   Meds ordered this encounter  Medications  . metoprolol tartrate (LOPRESSOR) 100 MG tablet    Sig: Take 1 tablet by mouth once for procedure.    Dispense:  1 tablet    Refill:  0    Patient Instructions  Medication Instructions:  Take metoprolol 100 mg two hours before CT.   *If you need a refill on your cardiac medications before your next appointment, please call your pharmacy*    Testing/Procedures: Your physician has requested that you have cardiac CT. Cardiac computed tomography (CT) is a painless test that uses an  x-ray machine to take clear, detailed pictures of your heart. For further information please visit [email or web address] Please follow instruction sheet as given.    Follow-Up: At [facility], you and your health needs are our priority.  As part of our continuing mission to provide you with exceptional heart care, we have created designated Provider Care Teams.  These Care Teams include your primary Cardiologist (physician) and Advanced Practice Providers (APPs -  Physician Assistants and Nurse Practitioners) who all work together to provide you with the care you need, when you need it.  We recommend signing up for the patient portal called "MyChart".  Sign up information is provided on this After Visit Summary.  MyChart is used to connect with patients for Virtual Visits (Telemedicine).  Patients are able to view lab/test results, encounter notes, upcoming appointments, etc.  Non-urgent messages can be sent to your provider as well.   To learn more about what you can do with MyChart, go to [email or web address].    Your next appointment:   As needed  The format for your next appointment:   In Person  Provider:   Lennie Odor, MD   Other Instructions Your cardiac CT will be scheduled at one of the below locations:   [facility] [address] [place], [place] [address] [phone]  If scheduled at [facility], please arrive at the [facility] main entrance of [facility] 30 minutes prior to test start time. Proceed to the [facility] Radiology Department (first floor) to check-in and test prep.   Please follow these instructions carefully (unless otherwise directed):   On the Night Before the Test: . Be sure to Drink plenty of water. . Do not consume any caffeinated/decaffeinated beverages or chocolate 12 hours prior to your test. . Do not take any antihistamines 12 hours prior to your test. . If you take Metformin do not take 24 hours prior  to test.   On the Day of the Test: . Drink plenty of water. Do not drink any water within one hour of the test. . Do not eat any food 4 hours prior to the test. . You may take your regular medications prior to the test.  . Take metoprolol (Lopressor) two hours prior to test. . HOLD Furosemide/Hydrochlorothiazide morning of the test. . FEMALES- please wear underwire-free bra if available       After the Test: . Drink plenty of water. . After receiving IV contrast, you may experience a mild flushed feeling. This is normal. . On occasion, you may experience a mild rash up to 24 hours after the test. This is not dangerous. If this occurs, you can take Benadryl 25 mg and increase your fluid intake. . If you experience trouble breathing, this can be serious. If it is severe call 911 IMMEDIATELY. If it is mild, please call our office. . If you take any of these medications: Glipizide/Metformin, Avandament, Glucavance, please do not take 48 hours after completing test unless otherwise instructed.   Once we have confirmed authorization from your insurance company, we will call you to set up a date and time for your test.   For non-scheduling related questions, please  contact the cardiac imaging nurse navigator should you have any questions/concerns: Marchia Bond, RN Navigator Cardiac Imaging [facility] [phone] office  For scheduling needs, including cancellations and rescheduling, please call [phone].     Signed, Addison Naegeli. Audie Box, [place]  [address], [place], [place] [address] [phone]  05/06/2019 11:28 AM

## 2019-05-06 ENCOUNTER — Encounter: Payer: Self-pay | Admitting: Cardiovascular Disease

## 2019-05-06 ENCOUNTER — Other Ambulatory Visit: Payer: Self-pay

## 2019-05-06 ENCOUNTER — Ambulatory Visit: Payer: 59 | Admitting: Cardiovascular Disease

## 2019-05-06 VITALS — BP 142/68 | HR 93 | Ht 66.0 in | Wt 133.4 lb

## 2019-05-06 DIAGNOSIS — R079 Chest pain, unspecified: Secondary | ICD-10-CM | POA: Diagnosis not present

## 2019-05-06 MED ORDER — METOPROLOL TARTRATE 100 MG PO TABS
ORAL_TABLET | ORAL | 0 refills | Status: DC
Start: 2019-05-06 — End: 2019-06-08

## 2019-05-06 NOTE — Patient Instructions (Signed)
Medication Instructions:  Take metoprolol 100 mg two hours before CT.   *If you need a refill on your cardiac medications before your next appointment, please call your pharmacy*    Testing/Procedures: Your physician has requested that you have cardiac CT. Cardiac computed tomography (CT) is a painless test that uses an x-ray machine to take clear, detailed pictures of your heart. For further information please visit [email or web address]. Please follow instruction sheet as given.    Follow-Up: At [facility], you and your health needs are our priority.  As part of our continuing mission to provide you with exceptional heart care, we have created designated Provider Care Teams.  These Care Teams include your primary Cardiologist (physician) and Advanced Practice Providers (APPs -  Physician Assistants and Nurse Practitioners) who all work together to provide you with the care you need, when you need it.  We recommend signing up for the patient portal called "MyChart".  Sign up information is provided on this After Visit Summary.  MyChart is used to connect with patients for Virtual Visits (Telemedicine).  Patients are able to view lab/test results, encounter notes, upcoming appointments, etc.  Non-urgent messages can be sent to your provider as well.   To learn more about what you can do with MyChart, go to [email or web address].    Your next appointment:   As needed  The format for your next appointment:   In Person  Provider:   Eleonore Chiquito, MD   Other Instructions Your cardiac CT will be scheduled at one of the below locations:   [facility] [address] [place], [place] [address] [phone]  If scheduled at [facility], please arrive at the [facility] main entrance of [facility] 30 minutes prior to test start time. Proceed to the [facility] Radiology Department (first floor) to check-in and test prep.   Please follow these  instructions carefully (unless otherwise directed):   On the Night Before the Test: . Be sure to Drink plenty of water. . Do not consume any caffeinated/decaffeinated beverages or chocolate 12 hours prior to your test. . Do not take any antihistamines 12 hours prior to your test. . If you take Metformin do not take 24 hours prior to test.   On the Day of the Test: . Drink plenty of water. Do not drink any water within one hour of the test. . Do not eat any food 4 hours prior to the test. . You may take your regular medications prior to the test.  . Take metoprolol (Lopressor) two hours prior to test. . HOLD Furosemide/Hydrochlorothiazide morning of the test. . FEMALES- please wear underwire-free bra if available       After the Test: . Drink plenty of water. . After receiving IV contrast, you may experience a mild flushed feeling. This is normal. . On occasion, you may experience a mild rash up to 24 hours after the test. This is not dangerous. If this occurs, you can take Benadryl 25 mg and increase your fluid intake. . If you experience trouble breathing, this can be serious. If it is severe call 911 IMMEDIATELY. If it is mild, please call our office. . If you take any of these medications: Glipizide/Metformin, Avandament, Glucavance, please do not take 48 hours after completing test unless otherwise instructed.   Once we have confirmed authorization from your insurance company, we will call you to set up a date and time for your test.   For non-scheduling related  questions, please contact the cardiac imaging nurse navigator should you have any questions/concerns: Rockwell Alexandria, RN Navigator Cardiac Imaging [facility] [phone] office  For scheduling needs, including cancellations and rescheduling, please call [phone].

## 2019-05-29 ENCOUNTER — Telehealth (HOSPITAL_COMMUNITY): Payer: Self-pay | Admitting: *Deleted

## 2019-05-29 NOTE — Telephone Encounter (Signed)
Reaching out to patient to offer assistance regarding upcoming cardiac imaging study; pt verbalizes understanding of appt date/time, parking situation and where to check in, pre-test NPO status and medications ordered, and verified current allergies; name and call back number provided for further questions should they arise  Merle Tai RN Navigator Cardiac Imaging [facility] [phone] office [phone] cell 

## 2019-06-02 ENCOUNTER — Ambulatory Visit (HOSPITAL_COMMUNITY)
Admission: RE | Admit: 2019-06-02 | Discharge: 2019-06-02 | Disposition: A | Discharge status: Home / Self Care | Payer: 59 | Source: Ambulatory Visit | Attending: Cardiovascular Disease | Admitting: Cardiovascular Disease

## 2019-06-02 ENCOUNTER — Other Ambulatory Visit: Payer: Self-pay

## 2019-06-02 DIAGNOSIS — R079 Chest pain, unspecified: Secondary | ICD-10-CM | POA: Diagnosis present

## 2019-06-02 MED ORDER — NITROGLYCERIN 0.4 MG SL SUBL
SUBLINGUAL_TABLET | SUBLINGUAL | Status: AC
  Filled 2019-06-02: qty 2

## 2019-06-02 MED ORDER — IOHEXOL 350 MG/ML SOLN
80.0000 mL | Freq: Once | INTRAVENOUS | Status: AC | PRN
  Administered 2019-06-02: 80 mL via INTRAVENOUS

## 2019-06-02 MED ORDER — NITROGLYCERIN 0.4 MG SL SUBL
0.8000 mg | SUBLINGUAL_TABLET | Freq: Once | SUBLINGUAL | Status: AC
  Administered 2019-06-02: 0.8 mg via SUBLINGUAL

## 2019-06-02 MED ORDER — METOPROLOL TARTRATE 5 MG/5ML IV SOLN
5.0000 mg | INTRAVENOUS | Status: DC | PRN
  Administered 2019-06-02: 5 mg via INTRAVENOUS

## 2019-06-02 MED ORDER — METOPROLOL TARTRATE 5 MG/5ML IV SOLN
INTRAVENOUS | Status: AC
  Filled 2019-06-02: qty 10

## 2019-06-02 NOTE — Progress Notes (Signed)
CT scan completed. Tolerated well. D/C home ambulatory, awake and alert. In no distress. 

## 2019-06-03 DIAGNOSIS — R079 Chest pain, unspecified: Secondary | ICD-10-CM | POA: Diagnosis not present

## 2019-06-08 ENCOUNTER — Other Ambulatory Visit: Payer: Self-pay

## 2019-06-08 ENCOUNTER — Telehealth: Payer: Self-pay | Admitting: Cardiovascular Disease

## 2019-06-08 ENCOUNTER — Ambulatory Visit: Payer: 59 | Admitting: Infectious Diseases

## 2019-06-08 ENCOUNTER — Encounter: Payer: Self-pay | Admitting: Infectious Diseases

## 2019-06-08 VITALS — BP 148/78 | HR 88 | Temp 98.7°F | Ht 66.0 in | Wt 134.0 lb

## 2019-06-08 DIAGNOSIS — Z1159 Encounter for screening for other viral diseases: Secondary | ICD-10-CM

## 2019-06-08 DIAGNOSIS — Z21 Asymptomatic human immunodeficiency virus [HIV] infection status: Secondary | ICD-10-CM | POA: Diagnosis not present

## 2019-06-08 DIAGNOSIS — B20 Human immunodeficiency virus [HIV] disease: Secondary | ICD-10-CM | POA: Insufficient documentation

## 2019-06-08 HISTORY — DX: Human immunodeficiency virus (HIV) disease: B20

## 2019-06-08 NOTE — Telephone Encounter (Signed)
Ok with me Stacy Watkins  

## 2019-06-08 NOTE — Patient Instructions (Signed)
Nice to meet you today. Please stop by the lab on your way out.   I will be in touch with the results so we can figure out next steps for you.

## 2019-06-08 NOTE — Telephone Encounter (Signed)
Mrs. Osberg is an established [facility] patient. I saw her as his schedule was full.   Gerri Spore T. Flora Lipps, MD [facility]  [address], [address] [place], [place] [address] [phone]  11:37 AM

## 2019-06-08 NOTE — Assessment & Plan Note (Addendum)
No previous testing documented to her knowledge. Children are 32 and 28 and presume she had screening at least with the 59 yr old. She had life insurance policy 20 years ago and presumably negatively tested then; also donated blood around that time and no information regarding positive test at that visit either.  She has no findings on exam suggesting advanced HIV infection. Some slightly palpable lymph nodes but they are very small < 0.5 cm on the left side. Health screening with mammography, pap and colon cancer screening up to date per report.  It sounds like she is very low risk in discussion with her today. Presuming she had 3rd generation ELISA test for Ab done which has a very low risk for false positive results. Will repeat HIV-1 antibody and check HIV quantitative RNA to further investigate. Will also check CD4 and CBC to assess immune function given chronic EBV infection and looking for secondary findings that would suggest immune compromise. Even if she were to be a long term non-progressor I would suspect that we would have seen some signs of advancing disease 20-35 years in.   She is OK with receiving a phone call with results. Will also screen Hepatitis C Ab for lifelong maintenance check.

## 2019-06-08 NOTE — Progress Notes (Signed)
Patient: Stacy Watkins  DOB: [birth year] MRN: [id number] PCP: Carol Ada, MD    Patient Active Problem List   Diagnosis Date Noted  . 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 Paresthesia of upper and lower extremity 04/26/2017  . 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 Upper airway cough syndrome 07/19/2016  . Chest pain 03/19/2016  . Tachycardia 03/13/2016  . Headache   . Chest pressure   . PURE HYPERCHOLESTEROLEMIA 01/24/2009  . CAROTID STENOSIS 12/10/2008  . ANXIETY DEPRESSION 11/29/2008  . ANXIETY, SITUATIONAL 11/29/2008  . UNSPECIFIED CARDIOVASCULAR DISEASE 11/29/2008     Subjective:  Stacy Watkins is a 59 y.o. female referred here to investigate a recent positive HIV-1 antibody test when she sought to renew life insurance policy.   She has been married to same female partner 10 years. Prior to this she had a few other female sexual contacts but not high number of partners. She has never exchanged sex for money or drugs. Never injected drugs in her life. No past exposure she is aware of in health care setting, no blood or blood product transfusion history, no puncture from needle/medical equipment. She has been otherwise in good health up until a few years ago when she started having problems with recurrent HSV and EBV flares. She sees a functional medicine doctor for these problems for immune support and has improved with diet/exercise/supplements.   She has never been hospitalized with significant illness. No suggestion of advancing HIV disease such as fevers, night sweats, weight loss, anorexia, cough, SOB, nausea, vomiting, diarrhea, headache, sensory changes, or oral thrush.  She has had 2 lymph nodes on the left posterior cervical chain and left supraclavicular. They occasionally flare up an down but are never visible to others but she can feel them. The left cervical has been stable for years.   Review of Systems  Constitutional: Negative for appetite change,  chills, fatigue, fever and unexpected weight change.  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   Eyes: Negative for pain and visual disturbance.  Respiratory: Negative for cough and shortness of breath.   Cardiovascular: Negative for chest pain.  Gastrointestinal: Negative for abdominal pain, diarrhea and nausea.  Genitourinary: Negative for dysuria, menstrual problem and pelvic pain.  Musculoskeletal: Negative for back pain and neck pain.  Skin: Negative for color change and rash.  Neurological: Negative for weakness, numbness and headaches.  Hematological: Negative for adenopathy.  Psychiatric/Behavioral: Negative for dysphoric mood. The patient is not nervous/anxious.      Past Medical History:  Diagnosis Date  . Anxiety   . Depression   . Depression with anxiety   . Headache   . Seasonal allergies   . Situational stress     Outpatient Medications Prior to Visit  Medication Sig Dispense Refill  . aspirin 81 MG tablet Take 81 mg by mouth daily.    Marland Kitchen atenolol (TENORMIN) 50 MG tablet Take 50 mg by mouth daily.     . butalbital-acetaminophen-caffeine (FIORICET, ESGIC) 50-325-40 MG tablet Take 1 tablet by mouth every 6 (six) hours as needed for migraine.     Marland Kitchen CALCIUM PO Take by mouth.    . Cholecalciferol (VITAMIN D PO) Take by mouth.    . Morphine-Naltrexone 20-0.8 MG CPCR Take 1 tablet by mouth daily.    . MULTIPLE VITAMIN PO Take 1 tablet by mouth daily.     . NON FORMULARY 75 mg. Pregnenolone    .  nortriptyline (PAMELOR) 50 MG capsule Take 50 mg by mouth daily.     . Omega-3 Fatty Acids (FISH OIL PO) Take by mouth.    . Probiotic Product (ALIGN PO) Take 1 capsule by mouth daily.    . rizatriptan (MAXALT) 10 MG tablet Take 10 mg by mouth as needed for migraine. May repeat in 2 hours if needed    . valACYclovir (VALTREX) 1000 MG tablet Take 1 tablet by mouth daily.    Marland Kitchen VITAMIN A PO Take by mouth.    Marland Kitchen  VITAMIN K PO Take by mouth.    . zolpidem (AMBIEN) 10 MG tablet Take 5 mg by mouth at bedtime as needed for sleep.     Marland Kitchen acetaminophen (TYLENOL) 325 MG tablet Take 2 tablets (650 mg total) by mouth every 6 (six) hours.    . cetirizine (ZYRTEC) 10 MG tablet Take by mouth.    Marland Kitchen ibuprofen (ADVIL) 200 MG tablet Take 3 tablets (600 mg total) by mouth every 6 (six) hours.  0  . metoprolol tartrate (LOPRESSOR) 100 MG tablet Take 1 tablet by mouth once for procedure. 1 tablet 0  . oxyCODONE (ROXICODONE) 5 MG immediate release tablet Take 1 tablet (5 mg total) by mouth every 6 (six) hours as needed for breakthrough pain. 10 tablet 0   No facility-administered medications prior to visit.     Allergies  Allergen Reactions  . Dust Mite Extract Other (See Comments)  . Mold Extract  [Trichophyton] Other (See Comments)    Social History   Tobacco Use  . Smoking status: Never Smoker  . Smokeless tobacco: Never Used  Substance Use Topics  . Alcohol use: No  . Drug use: No    Family History  Problem Relation Age of Onset  . Heart failure Mother   . Colon cancer Father   . CAD Father        MI in his 94s  . Heart attack Father   . CAD Sister        Died at age 76 of MI  . Heart attack Sister   . Hypertension Brother     Objective:   Vitals:   06/08/19 1524  BP: (!) 148/78  Pulse: 88  Temp: 98.7 F (37.1 C)  SpO2: 100%  Weight: 134 lb (60.8 kg)  Height: 5\' 6"  (1.676 m)   Body mass index is 21.63 kg/m.   Physical Exam Vitals reviewed.  HENT:     Mouth/Throat:     Mouth: No oral lesions.     Dentition: No dental abscesses.  Cardiovascular:     Rate and Rhythm: Normal rate and regular rhythm.     Heart sounds: Normal heart sounds.  Pulmonary:     Effort: Pulmonary effort is normal.     Breath sounds: Normal breath sounds.  Abdominal:     General: There is no distension.     Palpations: Abdomen is soft.     Tenderness: There is no abdominal tenderness.   Musculoskeletal:        General: No tenderness. Normal range of motion.  Lymphadenopathy:     Cervical: Cervical adenopathy present.     Left cervical: Posterior cervical adenopathy present.     Upper Body:     Left upper body: Supraclavicular adenopathy (< 1 cm matted and flat) present.  Skin:    General: Skin is warm and dry.     Findings: No rash.  Neurological:     Mental Status: She is  alert and oriented to person, place, and time.  Psychiatric:        Judgment: Judgment normal.     Lab Results: Lab Results  Component Value Date   WBC 2.7 (L) 04/30/2019   HGB 13.8 04/30/2019   HCT 41.0 04/30/2019   MCV 92.8 04/30/2019   PLT 128 (L) 04/30/2019    Lab Results  Component Value Date   CREATININE 0.80 04/30/2019   BUN 12 04/30/2019   NA 139 04/30/2019   K 4.2 04/30/2019   CL 104 04/30/2019   CO2 28 04/30/2019    Lab Results  Component Value Date   ALT 31 01/24/2009     Assessment & Plan:   Problem List Items Addressed This Visit      Unprioritized   HIV antibody positive ([facility]) - Primary    No previous testing documented to her knowledge. Children are 32 and 28 and presume she had screening at least with the 59 yr old. She had life insurance policy 20 years ago and presumably negatively tested then; also donated blood around that time and no information regarding positive test at that visit either.  She has no findings on exam suggesting advanced HIV infection. Some slightly palpable lymph nodes but they are very small < 0.5 cm on the left side. Health screening with mammography, pap and colon cancer screening up to date per report.  It sounds like she is very low risk in discussion with her today. Presuming she had 3rd generation ELISA test for Ab done which has a very low risk for false positive results. Will repeat HIV-1 antibody and check HIV quantitative RNA to further investigate. Will also check CD4 and CBC to assess immune function given chronic EBV infection  and looking for secondary findings that would suggest immune compromise. Even if she were to be a long term non-progressor I would suspect that we would have seen some signs of advancing disease 20-35 years in.   She is OK with receiving a phone call with results. Will also screen Hepatitis C Ab for lifelong maintenance check.       Relevant Medications   valACYclovir (VALTREX) 1000 MG tablet   Other Relevant Orders   HIV Antibody (routine testing w rflx)   HIV-1 RNA quant-no reflex-bld   CBC   T-helper cell (CD4)- ([facility] clinic only)    Other Visit Diagnoses    Encounter for hepatitis C screening test for low risk patient       Relevant Orders   Hepatitis C antibody      Rexene Alberts, MSN, NP-C [facility] for Infectious Disease [facility] Pager: [phone] Office: [phone]  06/08/19  4:18 PM

## 2019-06-08 NOTE — Telephone Encounter (Signed)
New Message   Pt is wanting to transfer care from Dr Scharlene Gloss to Dr Jens Som    Please advise

## 2019-06-09 ENCOUNTER — Telehealth: Payer: Self-pay | Admitting: Infectious Diseases

## 2019-06-09 LAB — T-HELPER CELL (CD4) - (RCID CLINIC ONLY)
CD4 % Helper T Cell: 6 % — ABNORMAL LOW (ref 33–65)
CD4 T Cell Abs: <35 /uL — ABNORMAL LOW (ref 400–1790)

## 2019-06-09 MED ORDER — SULFAMETHOXAZOLE-TRIMETHOPRIM 400-80 MG PO TABS
1.0000 | ORAL_TABLET | Freq: Every day | ORAL | 4 refills | Status: DC
Start: 2019-06-09 — End: 2019-08-24

## 2019-06-09 NOTE — Telephone Encounter (Signed)
CBC    Component Value Date/Time   WBC 1.9 (L) 06/08/2019 1558   RBC 4.15 06/08/2019 1558   HGB 13.2 06/08/2019 1558   HCT 38.6 06/08/2019 1558   PLT 143 06/08/2019 1558   MCV 93.0 06/08/2019 1558   MCH 31.8 06/08/2019 1558   MCHC 34.2 06/08/2019 1558   RDW 13.5 06/08/2019 1558   Lab Results  Component Value Date   CD4TCELL 6 (L) 06/08/2019   CD4TABS <35 (L) 06/08/2019    I called to discuss the above findings with Johnny Bridge. While we are still awaiting confirmation with HIV viral load and repeat Ab testing, secondary findings of leucopenia and T-cell deficiency are concerning that she does have advanced HIV. While there are other things that can cause these findings (CVI, idiopathic CD4 deficiency, lymphoma) her recent HIV-1 Ab screening results have me concerned she does have advanced HIV infection. Will prescribe Bactrim SS once daily for prophylaxis while we await confirmatory viral load and Ab testing.    Will schedule her husband for labs this afternoon so he may have screening as well. She went back through records and noticed that she has had normal blood counts up until 2018. Since then she has noticed decreased total WBC counts and has had several viral illnesses flare up since then.

## 2019-06-10 ENCOUNTER — Telehealth: Payer: Self-pay | Admitting: Infectious Diseases

## 2019-06-10 LAB — CBC
HCT: 38.6 % (ref 35.0–45.0)
Hemoglobin: 13.2 g/dL (ref 11.7–15.5)
MCH: 31.8 pg (ref 27.0–33.0)
MCHC: 34.2 g/dL (ref 32.0–36.0)
MCV: 93 fL (ref 80.0–100.0)
MPV: 10.2 fL (ref 7.5–12.5)
Platelets: 143 10*3/uL (ref 140–400)
RBC: 4.15 10*6/uL (ref 3.80–5.10)
RDW: 13.5 % (ref 11.0–15.0)
WBC: 1.9 10*3/uL — ABNORMAL LOW (ref 3.8–10.8)

## 2019-06-10 LAB — HIV-1 RNA QUANT-NO REFLEX-BLD
HIV 1 RNA Quant: 151000 copies/mL — ABNORMAL HIGH
HIV-1 RNA Quant, Log: 5.18 Log copies/mL — ABNORMAL HIGH

## 2019-06-10 LAB — HEPATITIS C ANTIBODY
Hepatitis C Ab: NONREACTIVE
SIGNAL TO CUT-OFF: 0.04 (ref <1.00)

## 2019-06-10 LAB — HIV ANTIBODY (ROUTINE TESTING W REFLEX): HIV 1&2 Ab, 4th Generation: REACTIVE — AB

## 2019-06-10 LAB — HIV-1/2 AB - DIFFERENTIATION
HIV-1 antibody: POSITIVE — AB
HIV-2 Ab: NEGATIVE

## 2019-06-10 NOTE — Telephone Encounter (Signed)
Results for orders placed or performed in visit on 06/08/19 (from the past 72 hour(s))  T-helper cell (CD4)- ([facility] clinic only)     Status: Abnormal   Collection Time: 06/08/19  3:54 PM  Result Value Ref Range   CD4 T Cell Abs <35 (L) 400 - 1,790 /uL   CD4 % Helper T Cell 6 (L) 33 - 65 %    Comment: Performed at [facility], [place] [address], [place], [place] [address]  HIV Antibody (routine testing w rflx)     Status: Abnormal   Collection Time: 06/08/19  3:58 PM  Result Value Ref Range   HIV 1&2 Ab, 4th Generation REPEATEDLY REACTIVE (A) NON-REACTI    Comment: The repeatedly reactive screening assay result is confirmed by duplicate repeat testing, and indicates a POSSIBLE presence of HIV-1 antibodies or HIV-2  antibodies, and/or HIV-1 p24 antigen. Additional testing is required for diagnosis.  . Therefore, these screening results must be correlated  with results of reflex confirmatory tests, including the HIV-1/HIV-2 antibody differentiation assay and, if necessary, HIV-1 RNA, Qualitative Real-Time PCR. Marland Kitchen The 4th generation HIV-1/2 Antigen/Antibody combination immunoassay is a screening test and should not be used alone for diagnosis. Repeatedly reactive results from the 4th generation screening test are only indicative of HIV infection when those screening results are confirmed to be positive by either the HIV-1/2 Antibody Differentiation Assay or the HIV-1 RNA, Qualitative Real-Time PCR test. . PLEASE NOTE: This information has been disclosed to you from records whose confidentiality may be protected by state law. If  your state requires such protection, then the state law prohibits you from making any further disclosure of the information without the specific written consent of the person to whom it pertains, or as otherwise permitted by law. A general authorization for the release of medical or other information is NOT sufficient for this  purpose. . The performance of this assay has not been clinically validated in patients less than 55 years old. .   Hepatitis C antibody     Status: None   Collection Time: 06/08/19  3:58 PM  Result Value Ref Range   Hepatitis C Ab NON-REACTIVE NON-REACTI   SIGNAL TO CUT-OFF 0.04 <1.00    Comment: . HCV antibody was non-reactive. There is no laboratory  evidence of HCV infection. . In most cases, no further action is required. However, if recent HCV exposure is suspected, a test for HCV RNA (test code 431 [id number] [id number]) is suggested. . For additional information please refer to http://education.questdiagnostics.com/faq/FAQ22v1 (This link is being provided for informational/ educational purposes only.) .   CBC     Status: Abnormal   Collection Time: 06/08/19  3:58 PM  Result Value Ref Range   WBC 1.9 (L) 3.8 - 10.8 Thousand/uL   RBC 4.15 3.80 - 5.10 Million/uL   Hemoglobin 13.2 11.7 - 15.5 g/dL   HCT 38.6 35.0 - 45.0 %   MCV 93.0 80.0 - 100.0 fL   MCH 31.8 27.0 - 33.0 pg   MCHC 34.2 32.0 - 36.0 g/dL   RDW 13.5 11.0 - 15.0 %   Platelets 143 140 - 400 Thousand/uL   MPV 10.2 7.5 - 12.5 fL  HIV-1/2 AB - differentiation     Status: Abnormal   Collection Time: 06/08/19  3:58 PM  Result Value Ref Range   HIV-1 antibody POSITIVE (A) NEGATIVE   HIV-2 Ab NEGATIVE NEGATIVE    Comment: . FINAL ASSAY INTERPRETATION: HIV-1 POSITIVE . Laboratory evidence of HIV-1 infection  is present in  patients greater than 51 months of age.  . In patients less than 33 months of age, a positive  HIV-1 Antibody Differentiation assay result may  represent passive transfer of maternal antibody.  . In patients less than 77 months of age, testing the patient's specimen with the HIV-1 RNA, Qualitative  Real-Time PCR assay, or the HIV-1 DNA, Qualitative, PCR assay, is recommended to confirm HIV-1 infection. Marland Kitchen PLEASE NOTE: This information has been disclosed to you from records whose confidentiality may  be protected by state law. If your state requires such protection, then the state law prohibits you from  making any further disclosure of the information without the specific written consent of the person to whom it pertains, or as otherwise permitted by law. A general authorization for the release of medical or other information is NOT sufficient for this purpose. .  For additional information please refer to http://education.questdiagnostics.com/faq/FAQ106 (This link is being provided for informational/ educational purposes only.) .    Lab results called to [facility] indicating chronic, advanced HIV infection, +AIDS. We have started bactrim for prophylaxis already. She will come to clinic tomorrow to see me to discuss treatment, met out intake team, and update other pertinent lab work.  She will come at 3:15 tomorrow afternoon.    Janene Madeira, MSN, NP-C [facility] for Infectious Disease [email or web address] Pager: [phone] Office: [phone] [place]: [phone]

## 2019-06-11 ENCOUNTER — Ambulatory Visit: Payer: 59 | Admitting: Infectious Diseases

## 2019-06-11 ENCOUNTER — Other Ambulatory Visit: Payer: Self-pay

## 2019-06-11 VITALS — BP 122/74 | HR 49 | Temp 98.2°F | Wt 132.0 lb

## 2019-06-11 DIAGNOSIS — F4323 Adjustment disorder with mixed anxiety and depressed mood: Secondary | ICD-10-CM | POA: Diagnosis not present

## 2019-06-11 DIAGNOSIS — Z0000 Encounter for general adult medical examination without abnormal findings: Secondary | ICD-10-CM

## 2019-06-11 DIAGNOSIS — R079 Chest pain, unspecified: Secondary | ICD-10-CM | POA: Diagnosis not present

## 2019-06-11 DIAGNOSIS — Z113 Encounter for screening for infections with a predominantly sexual mode of transmission: Secondary | ICD-10-CM

## 2019-06-11 DIAGNOSIS — B20 Human immunodeficiency virus [HIV] disease: Secondary | ICD-10-CM | POA: Diagnosis not present

## 2019-06-11 DIAGNOSIS — B370 Candidal stomatitis: Secondary | ICD-10-CM

## 2019-06-11 MED ORDER — FLUCONAZOLE 100 MG PO TABS: 100.0000 mg | ORAL_TABLET | Freq: Every day | ORAL | 1 refills | Status: DC

## 2019-06-11 NOTE — Patient Instructions (Addendum)
So thankful to have you here so we can get you feeling better.   Biktarvy is the pill I would like for you to start taking to treat you - this will need to be taken once a day around the same time.  - Common side effects for a short time frame usually include headaches, nausea and diarrhea - OK to take over the counter tylenol for headaches and imodium for diarrhea - Try taking with food if you are nauseated  - If you take any multivitamins or supplements please separate them from your Biktarvy by 6 hours before and after.  The main thing is do not have them in the stomach at the same time.  Please continue taking your Bactrim also once a day  For the thrush - please take Fluconazole one pill once a day for 7 days - you have a refill available if needed.    I would like to see you back in 4 weeks to see how things are going.    A few sites that would be helpful to start with for education: http://www.cochran.net/   Https://www.[email or web address]/  [email or web address]  If you feel like you are not getting what you need let me know.

## 2019-06-12 MED ORDER — BIKTARVY 50-200-25 MG PO TABS: 1.0000 | ORAL_TABLET | Freq: Every day | ORAL | 11 refills | Status: DC

## 2019-06-15 ENCOUNTER — Encounter: Payer: Self-pay | Admitting: Infectious Diseases

## 2019-06-15 ENCOUNTER — Other Ambulatory Visit (HOSPITAL_COMMUNITY)
Admission: RE | Admit: 2019-06-15 | Discharge: 2019-06-15 | Disposition: A | Discharge status: Home / Self Care | Payer: 59 | Source: Ambulatory Visit | Attending: Family Medicine | Admitting: Family Medicine

## 2019-06-15 ENCOUNTER — Other Ambulatory Visit: Payer: Self-pay | Admitting: Family Medicine

## 2019-06-15 DIAGNOSIS — Z124 Encounter for screening for malignant neoplasm of cervix: Secondary | ICD-10-CM | POA: Insufficient documentation

## 2019-06-15 DIAGNOSIS — B20 Human immunodeficiency virus [HIV] disease: Secondary | ICD-10-CM | POA: Insufficient documentation

## 2019-06-15 DIAGNOSIS — Z0000 Encounter for general adult medical examination without abnormal findings: Secondary | ICD-10-CM | POA: Insufficient documentation

## 2019-06-15 DIAGNOSIS — Z21 Asymptomatic human immunodeficiency virus [HIV] infection status: Secondary | ICD-10-CM | POA: Insufficient documentation

## 2019-06-15 DIAGNOSIS — B370 Candidal stomatitis: Secondary | ICD-10-CM | POA: Insufficient documentation

## 2019-06-15 NOTE — Assessment & Plan Note (Signed)
Fluconazole as above.

## 2019-06-15 NOTE — Assessment & Plan Note (Signed)
Spent time discussing process to adjust with new chronic diagnosis. Fortunately we have excellent treatment and I suspect she will improve nicely and in 3-6 months find herself feeling better than she has in a long time.  Encouraged ongoing support through close family members and counselor. Offered we have counselor here as well if she finds she needs it.

## 2019-06-15 NOTE — Assessment & Plan Note (Signed)
Recent problem with +family history.  Coronary CT without any significant disease. Will stay away from abacavir containing regimen.  Last lipid panel from PCP office in good range - she takes a plant-based sterol that seems to be working for her. Follow up in 3 months on Biktarvy, given that TAF can increase.

## 2019-06-15 NOTE — Assessment & Plan Note (Signed)
She is tolerating Bactrim prophylaxis well. Given integrase regimen will not start Azithromycin given rapid response in reducing VL.  Oral thrush on exam and concerns for vaginitis - will give fluconazole 200 mg PO x 7 days. May need to repeat with stress reaction - will give a refill.

## 2019-06-15 NOTE — Progress Notes (Signed)
Patient: Stacy Watkins  DOB: 01-15-1960 MRN: [id number] PCP: Carol Ada, MD    Patient Active Problem List   Diagnosis Date Noted  . AIDS (acquired immune deficiency syndrome) ([place]) 06/15/2019  . Oral thrush 06/15/2019  . Healthcare maintenance 06/15/2019  . HIV disease ([place]) 06/08/2019  . Paresthesia of upper and lower extremity 04/26/2017  . 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 Upper airway cough syndrome 07/19/2016  . Chest pain 03/19/2016  . Tachycardia 03/13/2016  . Headache   . PURE HYPERCHOLESTEROLEMIA 01/24/2009  . CAROTID STENOSIS 12/10/2008  . Adjustment reaction 11/29/2008     Subjective:  Brief Narrative: Stacy Watkins is a 59 y.o. female with HIV, Dx 05/2019 AIDS+ at Dx.  CD4 nadir < 35 HIV Risk: Heterosexual OI: oral thrush Intake Labs: Hep B sAg (-), sAb (-), cAb (-), Hep A Ab (-), Hep C Ab (-) Quantiferon (-) HLA B*5701 (pending)  Previous Regimens:  Naive  Genotype:   Pending 06-2019   Chief Complaint  Patient presents with  . New Patient (Initial Visit)    B20     HPI: Received confirmation with VL 151,000 copies and CD4 < 35. Have also had her husband tested with confirmed HIV+ status as well. She has been working with a Social worker for years and has already started working on adjusting/processing this information. She is understandably upset and has questions about acquisition, timing, etc. She has shared her diagnosis with a few close friends/family and feels supported.   She has a list of supplements with her to review so we can discuss new medication. She has already picked up her bactrim and so far tolerating this well. She believes she has thrush present in mouth/soft palate today. No pain with swallowing but discomfort with eating/drinking. She has had thrush before and used nystatin swish and swallow before with good success but today also feels as if she has vaginal yeast infection as well. She has not tried any OTC  remedies for this yet.    Review of Systems  Constitutional: Negative for appetite change, chills, fatigue, fever and unexpected weight change.  HENT: Positive for mouth sores (+ thrush ). Negative for sore throat and trouble swallowing.   Eyes: Negative for pain and visual disturbance.  Respiratory: Negative for cough and shortness of breath.   Cardiovascular: Negative for chest pain.  Gastrointestinal: Negative for abdominal pain, diarrhea and nausea.  Genitourinary: Negative for dysuria, menstrual problem and pelvic pain.  Musculoskeletal: Negative for back pain and neck pain.  Skin: Negative for color change and rash.  Neurological: Negative for weakness, numbness and headaches.  Hematological: Positive for adenopathy.  Psychiatric/Behavioral: Positive for sleep disturbance. Negative for dysphoric mood. The patient is not nervous/anxious.      Past Medical History:  Diagnosis Date  . Anxiety   . Depression   . Depression with anxiety   . Headache   . HIV disease ([place]) 06/08/2019  . Seasonal allergies   . Situational stress     Outpatient Medications Prior to Visit  Medication Sig Dispense Refill  . aspirin 81 MG tablet Take 81 mg by mouth daily.    Marland Kitchen atenolol (TENORMIN) 50 MG tablet Take 50 mg by mouth daily.     . butalbital-acetaminophen-caffeine (FIORICET, ESGIC) 50-325-40 MG tablet Take 1 tablet by mouth every 6 (six) hours as needed for migraine.     Marland Kitchen CALCIUM PO Take by mouth.    . Cholecalciferol (VITAMIN D PO) Take by  mouth.    . Morphine-Naltrexone 20-0.8 MG CPCR Take 1 tablet by mouth daily.    . MULTIPLE VITAMIN PO Take 1 tablet by mouth daily.     . NON FORMULARY 75 mg. Pregnenolone    . nortriptyline (PAMELOR) 50 MG capsule Take 50 mg by mouth daily.     . Omega-3 Fatty Acids (FISH OIL PO) Take by mouth.    . Probiotic Product (ALIGN PO) Take 1 capsule by mouth daily.    . rizatriptan (MAXALT) 10 MG tablet Take 10 mg by mouth as needed for migraine. May  repeat in 2 hours if needed    . sulfamethoxazole-trimethoprim (BACTRIM) 400-80 MG tablet Take 1 tablet by mouth daily. 30 tablet 4  . valACYclovir (VALTREX) 1000 MG tablet Take 1 tablet by mouth daily.    Marland Kitchen VITAMIN A PO Take by mouth.    Marland Kitchen VITAMIN K PO Take by mouth.    . zolpidem (AMBIEN) 10 MG tablet Take 5 mg by mouth at bedtime as needed for sleep.      No facility-administered medications prior to visit.     Allergies  Allergen Reactions  . Dust Mite Extract Other (See Comments)  . Mold Extract  [Trichophyton] Other (See Comments)    Social History   Tobacco Use  . Smoking status: Never Smoker  . Smokeless tobacco: Never Used  Vaping Use  . Vaping Use: Never used  Substance Use Topics  . Alcohol use: No  . Drug use: No     Objective:   Vitals:   06/11/19 1502  BP: 122/74  Pulse: (!) 49  Temp: 98.2 F (36.8 C)  TempSrc: Temporal  Weight: 132 lb (59.9 kg)   Body mass index is 21.31 kg/m.   Physical Exam Vitals reviewed.  HENT:     Mouth/Throat:     Mouth: No oral lesions.     Dentition: No dental abscesses.     Pharynx: Oropharyngeal exudate (scattered small white plaques on hard/soft palate inside mouth with eryhtematous base) present.  Cardiovascular:     Rate and Rhythm: Normal rate and regular rhythm.     Heart sounds: Normal heart sounds.  Pulmonary:     Effort: Pulmonary effort is normal.     Breath sounds: Normal breath sounds.  Abdominal:     General: There is no distension.     Palpations: Abdomen is soft.     Tenderness: There is no abdominal tenderness.  Musculoskeletal:        General: No tenderness. Normal range of motion.  Lymphadenopathy:     Cervical: Cervical adenopathy present.     Left cervical: Posterior cervical adenopathy present.     Upper Body:     Left upper body: Supraclavicular adenopathy (< 1 cm matted and flat) present.  Skin:    General: Skin is warm and dry.     Findings: No rash.  Neurological:     Mental  Status: She is alert and oriented to person, place, and time.  Psychiatric:        Behavior: Behavior normal.        Judgment: Judgment normal.     Lab Results: Lab Results  Component Value Date   WBC 1.9 (L) 06/08/2019   HGB 13.2 06/08/2019   HCT 38.6 06/08/2019   MCV 93.0 06/08/2019   PLT 143 06/08/2019    Lab Results  Component Value Date   CREATININE 1.03 06/11/2019   BUN 22 06/11/2019   NA 137 06/11/2019  K 4.4 06/11/2019   CL 103 06/11/2019   CO2 27 06/11/2019    Lab Results  Component Value Date   ALT 19 06/11/2019   AST 25 06/11/2019   BILITOT 0.3 06/11/2019     Assessment & Plan:   Problem List Items Addressed This Visit      Unprioritized   Adjustment reaction    Spent time discussing process to adjust with new chronic diagnosis. Fortunately we have excellent treatment and I suspect she will improve nicely and in 3-6 months find herself feeling better than she has in a long time.  Encouraged ongoing support through close family members and counselor. Offered we have counselor here as well if she finds she needs it.       Chest pain    Recent problem with +family history.  Coronary CT without any significant disease. Will stay away from abacavir containing regimen.  Last lipid panel from PCP office in good range - she takes a plant-based sterol that seems to be working for her. Follow up in 3 months on Biktarvy, given that TAF can increase.      HIV disease ([place])    Received confirmation that she is indeed in need of treatment and HIV+.  I discussed with Carter Kitten treatment options/side effects, benefits of treatment and long-term outcomes. I discussed how HIV is transmitted and the process of untreated HIV including increased risk for opportunistic infections, cancer, dementia and renal failure. Patient was counseled on routine HIV care including medication adherence, blood monitoring, necessary vaccines and follow up visits. Counseled regarding safe  sex practices including: condom use, partner disclosure, limiting partners. Patient spent time talking with our pharmacist Cassie regarding successful practices of ART and understands to reach out to our clinic in the future with questions.   Will start [place] for HIV treatment. Copay assistance available if needed.   General introduction to our clinic and integrated services. Discussed THP and other recommended sites to learn more about her condition and how to stay healthy.   I spent greater than 60 minutes with the patient today. Greater than 50% of the time spent face-to-face counseling and coordination of care re: HIV and health maintenance.        Relevant Medications   fluconazole (DIFLUCAN) 100 MG tablet   bictegravir-emtricitabine-tenofovir AF (BIKTARVY) 50-200-25 MG TABS tablet   Other Relevant Orders   HIV-1 RNA ultraquant reflex to gentyp+   COMPLETE METABOLIC PANEL WITH GFR (Completed)   Hepatitis B surface antigen (Completed)   Hepatitis B surface antibody,qualitative (Completed)   Hepatitis A Ab, Total (Completed)   Hepatitis B Core Antibody, total (Completed)   HLA B*5701   QuantiFERON-TB Gold Plus (Completed)   AIDS (acquired immune deficiency syndrome) ([place]) - Primary    She is tolerating Bactrim prophylaxis well. Given integrase regimen will not start Azithromycin given rapid response in reducing VL.  Oral thrush on exam and concerns for vaginitis - will give fluconazole 200 mg PO x 7 days. May need to repeat with stress reaction - will give a refill.       Relevant Medications   fluconazole (DIFLUCAN) 100 MG tablet   bictegravir-emtricitabine-tenofovir AF (BIKTARVY) 50-200-25 MG TABS tablet   Oral thrush    Fluconazole as above.       Relevant Medications   fluconazole (DIFLUCAN) 100 MG tablet   bictegravir-emtricitabine-tenofovir AF (BIKTARVY) 50-200-25 MG TABS tablet   Healthcare maintenance    Will need pap smear records at upcoming appt -  recommend she  have another screening done in 3-6 months for updated assessment.  Will await immune system improvement before administering recommended vaccines.        Other Visit Diagnoses    Routine screening for STI (sexually transmitted infection)       Relevant Orders   RPR (Completed)     Return in about 4 weeks (around 07/09/2019).   Janene Madeira, MSN, NP-C [facility] for Infectious [place] Pager: [phone] Office: [phone]

## 2019-06-15 NOTE — Assessment & Plan Note (Signed)
Will need pap smear records at upcoming appt - recommend she have another screening done in 3-6 months for updated assessment.  Will await immune system improvement before administering recommended vaccines.

## 2019-06-15 NOTE — Assessment & Plan Note (Signed)
Received confirmation that she is indeed in need of treatment and HIV+.  I discussed with Stacy Watkins treatment options/side effects, benefits of treatment and long-term outcomes. I discussed how HIV is transmitted and the process of untreated HIV including increased risk for opportunistic infections, cancer, dementia and renal failure. Patient was counseled on routine HIV care including medication adherence, blood monitoring, necessary vaccines and follow up visits. Counseled regarding safe sex practices including: condom use, partner disclosure, limiting partners. Patient spent time talking with our pharmacist Cassie regarding successful practices of ART and understands to reach out to our clinic in the future with questions.   Will start BIKTARVY for HIV treatment. Copay assistance available if needed.   General introduction to our clinic and integrated services. Discussed THP and other recommended sites to learn more about her condition and how to stay healthy.   I spent greater than 60 minutes with the patient today. Greater than 50% of the time spent face-to-face counseling and coordination of care re: HIV and health maintenance.

## 2019-06-18 LAB — CYTOLOGY - PAP
Chlamydia: NEGATIVE
Comment: NEGATIVE
Comment: NEGATIVE
Comment: NORMAL
Diagnosis: UNDETERMINED — AB
High risk HPV: NEGATIVE
Neisseria Gonorrhea: NEGATIVE

## 2019-06-26 LAB — QUANTIFERON-TB GOLD PLUS
Mitogen-NIL: 9.44 IU/mL
NIL: 0.05 IU/mL
QuantiFERON-TB Gold Plus: NEGATIVE
TB1-NIL: 0 IU/mL
TB2-NIL: 0 IU/mL

## 2019-06-26 LAB — COMPLETE METABOLIC PANEL WITH GFR
AG Ratio: 1.6 (calc) (ref 1.0–2.5)
ALT: 19 U/L (ref 6–29)
AST: 25 U/L (ref 10–35)
Albumin: 4.1 g/dL (ref 3.6–5.1)
Alkaline phosphatase (APISO): 72 U/L (ref 37–153)
BUN: 22 mg/dL (ref 7–25)
CO2: 27 mmol/L (ref 20–32)
Calcium: 9.4 mg/dL (ref 8.6–10.4)
Chloride: 103 mmol/L (ref 98–110)
Creat: 1.03 mg/dL (ref 0.50–1.05)
GFR, Est African American: 69 mL/min/{1.73_m2} (ref >=60)
GFR, Est Non African American: 59 mL/min/{1.73_m2} — ABNORMAL LOW (ref >=60)
Globulin: 2.5 g/dL (calc) (ref 1.9–3.7)
Glucose, Bld: 93 mg/dL (ref 65–99)
Potassium: 4.4 mmol/L (ref 3.5–5.3)
Sodium: 137 mmol/L (ref 135–146)
Total Bilirubin: 0.3 mg/dL (ref 0.2–1.2)
Total Protein: 6.6 g/dL (ref 6.1–8.1)

## 2019-06-26 LAB — HIV-1 GENOTYPE: HIV-1 Genotype: DETECTED — AB

## 2019-06-26 LAB — HEPATITIS B CORE ANTIBODY, TOTAL: Hep B Core Total Ab: NONREACTIVE

## 2019-06-26 LAB — HEPATITIS B SURFACE ANTIBODY,QUALITATIVE: Hep B S Ab: NONREACTIVE

## 2019-06-26 LAB — HIV-1 RNA ULTRAQUANT REFLEX TO GENTYP+
HIV 1 RNA Quant: 171000 copies/mL — ABNORMAL HIGH
HIV-1 RNA Quant, Log: 5.23 Log copies/mL — ABNORMAL HIGH

## 2019-06-26 LAB — HEPATITIS A ANTIBODY, TOTAL: Hepatitis A AB,Total: NONREACTIVE

## 2019-06-26 LAB — HLA B*5701: HLA-B*5701 w/rflx HLA-B High: NEGATIVE

## 2019-06-26 LAB — RPR: RPR Ser Ql: NONREACTIVE

## 2019-06-26 LAB — HEPATITIS B SURFACE ANTIGEN: Hepatitis B Surface Ag: NONREACTIVE

## 2019-06-30 NOTE — Progress Notes (Signed)
Virtual Visit via Video Note   This visit type was conducted due to national recommendations for restrictions regarding the COVID-19 Pandemic (e.g. social distancing) in an effort to limit this patient's exposure and mitigate transmission in our community.  Due to her co-morbid illnesses, this patient is at least at moderate risk for complications without adequate follow up.  This format is felt to be most appropriate for this patient at this time.  All issues noted in this document were discussed and addressed.  A limited physical exam was performed with this format.  Please refer to the patient's chart for her consent to telehealth for [facility].   Date:  07/10/2019   ID:  Stacy Watkins, DOB [birth year], MRN [id number]  Patient Location:Home Provider Location: Home  PCP:  Merri Brunette, MD  Cardiologist:  Dr Jens Som  Evaluation Performed:  Follow-Up Visit  Chief Complaint:  FU CP  History of Present Illness:    FU chest pain, palpitations and syncope. Stress echo 6/15 normal. Admitted March 2018 with syncope resulting in nasal fracture. Episode was felt likely to have been orthostatic mediated. Troponins were normal. Nuclear study 3/18 showed EF 73 and normal perfusion. Echocardiogram April 2018 showed normal LV function and grade 1 diastolic dysfunction. Monitor 3/18  showed sinus rhythm with no significant arrhythmia.  Cardiac CTA June 2021 showed calcium score of 0 and mild noncalcified plaque in the proximal LAD at 25 to 49%.  FFR was negative.  Since last seen, the patient denies any dyspnea on exertion, orthopnea, PND, pedal edema, palpitations, syncope or chest pain.   The patient does not have symptoms concerning for COVID-19 infection (fever, chills, cough, or new shortness of breath).    Past Medical History:  Diagnosis Date  . Anxiety   . Depression   . Depression with anxiety   . Headache   . HIV disease ([facility]) 06/08/2019  . Seasonal allergies   . Situational  stress    Past Surgical History:  Procedure Laterality Date  . CARPAL TUNNEL RELEASE  2016  . CARPAL TUNNEL RELEASE Left 10/11/2014   Procedure: LEFT  ENDOSCOPIC CARPAL TUNNEL RELEASE;  Surgeon: Mack Hook, MD;  Location: [facility];  Service: Orthopedics;  Laterality: Left;  . CESAREAN SECTION    . CHOLECYSTECTOMY    . DILATION AND CURETTAGE OF UTERUS    . EYE SURGERY    . TRIGGER FINGER RELEASE Right 12/09/2017   Procedure: RELEASE TRIGGER FINGER/A-1 PULLEY;  Surgeon: Mack Hook, MD;  Location: [facility];  Service: Orthopedics;  Laterality: Right;     Current Meds  Medication Sig  . aspirin 81 MG tablet Take 81 mg by mouth daily.  Marland Kitchen atenolol (TENORMIN) 50 MG tablet Take 50 mg by mouth daily.   . bictegravir-emtricitabine-tenofovir AF (BIKTARVY) 50-200-25 MG TABS tablet Take 1 tablet by mouth daily.  . butalbital-acetaminophen-caffeine (FIORICET, ESGIC) 50-325-40 MG tablet Take 1 tablet by mouth every 6 (six) hours as needed for migraine.   Marland Kitchen CALCIUM PO Take by mouth.  . Cholecalciferol (VITAMIN D PO) Take by mouth.  . fluconazole (DIFLUCAN) 100 MG tablet Take 1 tablet (100 mg total) by mouth daily.  . MULTIPLE VITAMIN PO Take 1 tablet by mouth daily.   . NON FORMULARY 75 mg. Pregnenolone  . nortriptyline (PAMELOR) 50 MG capsule Take 50 mg by mouth daily.   . Omega-3 Fatty Acids (FISH OIL PO) Take by mouth.  . Probiotic Product (ALIGN PO) Take 1 capsule by  mouth daily.  . rizatriptan (MAXALT) 10 MG tablet Take 10 mg by mouth as needed for migraine. May repeat in 2 hours if needed  . sulfamethoxazole-trimethoprim (BACTRIM) 400-80 MG tablet Take 1 tablet by mouth daily.  . valACYclovir (VALTREX) 1000 MG tablet Take 1 tablet by mouth daily.  Marland Kitchen VITAMIN A PO Take by mouth.  Marland Kitchen VITAMIN K PO Take by mouth.  . zolpidem (AMBIEN) 10 MG tablet Take 5 mg by mouth at bedtime as needed for sleep.      Allergies:   Dust mite extract and Mold extract   [trichophyton]   Social History   Tobacco Use  . Smoking status: Never Smoker  . Smokeless tobacco: Never Used  Vaping Use  . Vaping Use: Never used  Substance Use Topics  . Alcohol use: No  . Drug use: No     Family Hx: The patient's family history includes CAD in her father and sister; Colon cancer in her father; Heart attack in her father and sister; Heart failure in her mother; Hypertension in her brother.  ROS:   Please see the history of present illness.    No Fever, chills  or productive cough All other systems reviewed and are negative.  Recent Labs: 06/08/2019: Hemoglobin 13.2; Platelets 143 06/11/2019: ALT 19; BUN 22; Creat 1.03; Potassium 4.4; Sodium 137   Recent Lipid Panel Lab Results  Component Value Date/Time   CHOL 134 04/23/2013 08:08 AM   TRIG 89.0 04/23/2013 08:08 AM   HDL 43.20 04/23/2013 08:08 AM   CHOLHDL 3 04/23/2013 08:08 AM   LDLCALC 73 04/23/2013 08:08 AM    Wt Readings from Last 3 Encounters:  07/10/19 130 lb (59 kg)  07/09/19 130 lb (59 kg)  06/11/19 132 lb (59.9 kg)     Objective:    Vital Signs:  BP 129/77   Pulse 80   Ht 5\' 5"  (1.651 m)   Wt 130 lb (59 kg)   LMP 09/24/2012   BMI 21.63 kg/m    VITAL SIGNS:  reviewed NAD Answers questions appropriately Normal affect Remainder of physical examination not performed (telehealth visit; coronavirus pandemic)  ASSESSMENT & PLAN:    1. Coronary artery disease-patient with 25 to 49% LAD lesion.  FFR was negative.  Plan medical therapy.  We will treat with aspirin and statin. 2. History of syncope-no recurrent episodes.  Previous episode was felt to be orthostatic mediated.  LV function was normal. 3. Hyperlipidemia-given documented coronary disease will treat with Crestor 40 mg daily.  Check lipids and liver in 12 weeks. 4. Palpitations-we will continue beta-blocker. 5. Hypertension-patient's blood pressure has been controlled.  Continue present medications and follow.  COVID-19  Education: The importance of social distancing was discussed today.  Time:   Today, I have spent 17 minutes with the patient with telehealth technology discussing the above problems.     Medication Adjustments/Labs and Tests Ordered: Current medicines are reviewed at length with the patient today.  Concerns regarding medicines are outlined above.   Tests Ordered: No orders of the defined types were placed in this encounter.   Medication Changes: No orders of the defined types were placed in this encounter.   Follow Up:  In Person in 6 month(s)  Signed, 09/26/2012, MD  07/10/2019 9:10 AM    [facility]

## 2019-07-02 ENCOUNTER — Encounter: Payer: Self-pay | Admitting: Infectious Diseases

## 2019-07-09 ENCOUNTER — Encounter: Payer: Self-pay | Admitting: Infectious Diseases

## 2019-07-09 ENCOUNTER — Ambulatory Visit: Payer: 59 | Admitting: Infectious Diseases

## 2019-07-09 ENCOUNTER — Other Ambulatory Visit: Payer: Self-pay

## 2019-07-09 VITALS — BP 129/77 | HR 82 | Temp 99.0°F | Wt 130.0 lb

## 2019-07-09 DIAGNOSIS — B370 Candidal stomatitis: Secondary | ICD-10-CM

## 2019-07-09 DIAGNOSIS — B20 Human immunodeficiency virus [HIV] disease: Secondary | ICD-10-CM | POA: Diagnosis not present

## 2019-07-09 DIAGNOSIS — R21 Rash and other nonspecific skin eruption: Secondary | ICD-10-CM | POA: Diagnosis not present

## 2019-07-09 DIAGNOSIS — B351 Tinea unguium: Secondary | ICD-10-CM

## 2019-07-09 DIAGNOSIS — R87619 Unspecified abnormal cytological findings in specimens from cervix uteri: Secondary | ICD-10-CM | POA: Diagnosis not present

## 2019-07-09 NOTE — Assessment & Plan Note (Addendum)
Resolved - she has refill of diflucan available to her if needed.  Encouraged her to pay attention to ulcers in mouth - may need a treatment course of valtrex followed by resuming suppression until immune system builds back up and stress decreases.

## 2019-07-09 NOTE — Assessment & Plan Note (Signed)
Agree with recommendations to repeat in 6 months. May need colposcopy to further evaluate significance of findings. Will see if we can get records of the results.

## 2019-07-09 NOTE — Assessment & Plan Note (Signed)
Unclear what the cause of her rash is. Does not appear infected. Given the timing I doubt it is due to new medications as it pre-dates both. Would discourage prednisone systemically for now if possible to allow immune system to recover without interference.  Continue to follow with derm May be exacerbated due to mild immune reconstitution syndrome - will see what CD4 looks like.

## 2019-07-09 NOTE — Patient Instructions (Addendum)
Always nice to see you - I am happy to hear you are starting to feel better.   Please continue your Biktarvy and your Bactrim every day. OK to take together with your Atenolol and Nortriptyline   Please stop by the lab on your way out.   For any ulcers that come about in your mouth I would have you start taking the valtrex 1000 mg twice a day for 5 days to help them heal up.  If they come back frequently we may have you continue the suppressive dose until your immune system improves more.   Will plan on checking fasting blood work next visit so please try to schedule an earlier appointment so you aren't hungry all day.  Will check your cholesterol at that time.   Please come back in 3 months to see me.    For your toe I would like for you to start doing Epsom salt soaks daily. Could squirt a little betadine from your pharmacy in there also (just enough to tint the water yellow a bit -->careful this will stain fabrics).  Could use tea tree oil to the nail to try to treat fungal component but overall the nail bed currently growing looks very good. I don't think you need antibiotics at this point or care with podiatry.  Let me know if things change.

## 2019-07-09 NOTE — Assessment & Plan Note (Signed)
She is tolerating the Biktarvy very well without any side effects. She has not missed a dose over the last month.  Will check viral load today for therapeutic response. May not yet be undetectable given VL was initially > 100K.  RTC in 3 months - once CD4 improved will evaluate for necessary vaccines.

## 2019-07-09 NOTE — Assessment & Plan Note (Signed)
Distal nail bed is brittle and disfigured c/w fungal infection, however proximal nailbed is in good condition. She has mild redness around the nailbed without much tenderness. Will treat with epsom salt / betadine soaks and see how this fairs. May consider increasing bactrim to BID dosing if this does not improve with conservative measures.

## 2019-07-09 NOTE — Progress Notes (Signed)
Patient: Stacy Watkins  DOB: February 18, 1960 MRN: [id number] PCP: Carol Ada, MD    Patient Active Problem List   Diagnosis Date Noted  . Abnormal Pap smear of cervix 07/09/2019  . Rash and nonspecific skin eruption 07/09/2019  . Onychomycosis of great toe 07/09/2019  . AIDS (acquired immune deficiency syndrome) ([place]) 06/15/2019  . Oral thrush 06/15/2019  . Healthcare maintenance 06/15/2019  . HIV disease ([place]) 06/08/2019  . Paresthesia of upper and lower extremity 04/26/2017  . 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 Upper airway cough syndrome 07/19/2016  . Chest pain 03/19/2016  . Tachycardia 03/13/2016  . Headache   . PURE HYPERCHOLESTEROLEMIA 01/24/2009  . CAROTID STENOSIS 12/10/2008  . Adjustment reaction 11/29/2008     Subjective:  Brief Narrative: Stacy Watkins is a 59 y.o. female with HIV, Dx 05/2019 AIDS+ at Dx.  CD4 nadir < 35 HIV Risk: Heterosexual OI: oral thrush Intake Labs: Hep B sAg (-), sAb (-), cAb (-), Hep A Ab (-), Hep C Ab (-) Quantiferon (-) HLA B*5701 (pending)  Previous Regimens:  Biktarvy   Genotype:   06-2019 - wildtype   Chief Complaint  Patient presents with  . Follow-up    B20     HPI: Stacy Watkins is here for 4 week follow up after starting regularly on Biktarvy and Bactrim once daily. She has noticed more energy over the last few weeks. Stacy Watkins is gone. She has stopped a lot of the medications/treatments for chronic EBV that she was getting with functional medicine team to see how addressing HIV alone works for her.   She has noticed some worsening and persistence of some raised pink rashes to the backs of her arms. This was present since April and not getting any better with the creams prescribed by her dermatologist. May be a sun rash. They do itch and she feels that they are getting bigger.   Has noticed a possible infection around the right great toe nailbed. It is red and somewhat swollen but overall is described to be  getting a little better without intervention. She does have a history of nail fungus that has grown out a good bit. She also has a dark purple spot on the nail of her left #2 distal toe. ?tight shoes but has worn them for a while now.   Increased stress at home over the last few weeks in personal life and marriage. Going through a divorce. She has excellent support from friends, family and her therapist.   She went to her GYN for a pap smear - reported some abnormal cells and recommended return for repeat test in 6 months.    Review of Systems  Constitutional: Negative for chills and fever. Fatigue: improved.  HENT: Negative for dental problem, mouth sores and sore throat.   Respiratory: Negative for cough.   Cardiovascular: Negative for chest pain and leg swelling.  Gastrointestinal: Negative for abdominal pain, diarrhea and vomiting.  Genitourinary: Negative for dysuria and flank pain.  Musculoskeletal: Negative for myalgias and neck pain.  Skin: Positive for rash.  Neurological: Negative for dizziness and headaches.  Psychiatric/Behavioral: The patient is not nervous/anxious.      Past Medical History:  Diagnosis Date  . Anxiety   . Depression   . Depression with anxiety   . Headache   . HIV disease ([place]) 06/08/2019  . Seasonal allergies   . Situational stress     Outpatient Medications Prior to Visit  Medication Sig Dispense Refill  .  aspirin 81 MG tablet Take 81 mg by mouth daily.    Marland Kitchen atenolol (TENORMIN) 50 MG tablet Take 50 mg by mouth daily.     . bictegravir-emtricitabine-tenofovir AF (BIKTARVY) 50-200-25 MG TABS tablet Take 1 tablet by mouth daily. 30 tablet 11  . butalbital-acetaminophen-caffeine (FIORICET, ESGIC) 50-325-40 MG tablet Take 1 tablet by mouth every 6 (six) hours as needed for migraine.     Marland Kitchen CALCIUM PO Take by mouth.    . Cholecalciferol (VITAMIN D PO) Take by mouth.    . MULTIPLE VITAMIN PO Take 1 tablet by mouth daily.     . NON FORMULARY 75 mg.  Pregnenolone    . nortriptyline (PAMELOR) 50 MG capsule Take 50 mg by mouth daily.     . Omega-3 Fatty Acids (FISH OIL PO) Take by mouth.    . Probiotic Product (ALIGN PO) Take 1 capsule by mouth daily.    . rizatriptan (MAXALT) 10 MG tablet Take 10 mg by mouth as needed for migraine. May repeat in 2 hours if needed    . sulfamethoxazole-trimethoprim (BACTRIM) 400-80 MG tablet Take 1 tablet by mouth daily. 30 tablet 4  . valACYclovir (VALTREX) 1000 MG tablet Take 1 tablet by mouth daily.    Marland Kitchen VITAMIN A PO Take by mouth.    Marland Kitchen VITAMIN K PO Take by mouth.    . zolpidem (AMBIEN) 10 MG tablet Take 5 mg by mouth at bedtime as needed for sleep.     . fluconazole (DIFLUCAN) 100 MG tablet Take 1 tablet (100 mg total) by mouth daily. (Patient not taking: Reported on 07/09/2019) 7 tablet 1  . Morphine-Naltrexone 20-0.8 MG CPCR Take 1 tablet by mouth daily.     No facility-administered medications prior to visit.     Allergies  Allergen Reactions  . Dust Mite Extract Other (See Comments)  . Mold Extract  [Trichophyton] Other (See Comments)    Social History   Tobacco Use  . Smoking status: Never Smoker  . Smokeless tobacco: Never Used  Vaping Use  . Vaping Use: Never used  Substance Use Topics  . Alcohol use: No  . Drug use: No     Objective:   Vitals:   07/09/19 1015  BP: 129/77  Pulse: 82  Temp: 99 F (37.2 C)  TempSrc: Oral  Weight: 130 lb (59 kg)   Body mass index is 20.98 kg/m.   Physical Exam Constitutional:      Appearance: Normal appearance. She is not ill-appearing.  HENT:     Mouth/Throat:     Mouth: Mucous membranes are moist.     Pharynx: Oropharynx is clear.  Eyes:     General: No scleral icterus. Pulmonary:     Effort: Pulmonary effort is normal.  Musculoskeletal:       Feet:  Feet:     Comments: Erythema with some mild swelling around right great toe nailbed. Mildly tender with palpation.  Skin:    Comments: On the backs of her upper arms  bilaterally are small raised pink spots of varying size. No open areas. No drainage. No pain with palpation  Neurological:     Mental Status: She is oriented to person, place, and time.  Psychiatric:        Mood and Affect: Mood normal.        Thought Content: Thought content normal.      Lab Results: Lab Results  Component Value Date   WBC 1.9 (L) 06/08/2019   HGB 13.2 06/08/2019  HCT 38.6 06/08/2019   MCV 93.0 06/08/2019   PLT 143 06/08/2019    Lab Results  Component Value Date   CREATININE 1.03 06/11/2019   BUN 22 06/11/2019   NA 137 06/11/2019   K 4.4 06/11/2019   CL 103 06/11/2019   CO2 27 06/11/2019    Lab Results  Component Value Date   ALT 19 06/11/2019   AST 25 06/11/2019   BILITOT 0.3 06/11/2019     Assessment & Plan:   Problem List Items Addressed This Visit      Unprioritized   HIV disease ([place])    She is tolerating the [place] very well without any side effects. She has not missed a dose over the last month.  Will check viral load today for therapeutic response. May not yet be undetectable given VL was initially > 100K.  RTC in 3 months - once CD4 improved will evaluate for necessary vaccines.       AIDS (acquired immune deficiency syndrome) ([place]) - Primary    Will repeat CD4 today and have her continue Bactrim once daily. Discussed that the faster part in treatment is dropping viral load. CD4 reconstitution is variable person to person. Hopeful she will be above 200 in 6-12 months. I encouraged her to focus on the fact that the true risk reduction regarding opportunistic processes is the viral load suppression.       Relevant Orders   HIV-1 RNA quant-no reflex-bld   T-helper cell (CD4)- ([facility] clinic only)   Oral thrush    Resolved - she has refill of diflucan available to her if needed.  Encouraged her to pay attention to ulcers in mouth - may need a treatment course of valtrex followed by resuming suppression until immune system builds back up  and stress decreases.       Abnormal Pap smear of cervix    Agree with recommendations to repeat in 6 months. May need colposcopy to further evaluate significance of findings. Will see if we can get records of the results.       Rash and nonspecific skin eruption    Unclear what the cause of her rash is. Does not appear infected. Given the timing I doubt it is due to new medications as it pre-dates both. Would discourage prednisone systemically for now if possible to allow immune system to recover without interference.  Continue to follow with derm May be exacerbated due to mild immune reconstitution syndrome - will see what CD4 looks like.       Onychomycosis of great toe    Distal nail bed is brittle and disfigured c/w fungal infection, however proximal nailbed is in good condition. She has mild redness around the nailbed without much tenderness. Will treat with epsom salt / betadine soaks and see how this fairs. May consider increasing bactrim to BID dosing if this does not improve with conservative measures.         Return in about 3 months (around 10/09/2019).   Janene Madeira, MSN, NP-C [facility] for Infectious [place] Pager: [phone] Office: [phone]

## 2019-07-09 NOTE — Assessment & Plan Note (Signed)
Will repeat CD4 today and have her continue Bactrim once daily. Discussed that the faster part in treatment is dropping viral load. CD4 reconstitution is variable person to person. Hopeful she will be above 200 in 6-12 months. I encouraged her to focus on the fact that the true risk reduction regarding opportunistic processes is the viral load suppression.

## 2019-07-10 ENCOUNTER — Telehealth (INDEPENDENT_AMBULATORY_CARE_PROVIDER_SITE_OTHER): Payer: 59 | Admitting: Cardiology

## 2019-07-10 ENCOUNTER — Encounter: Payer: Self-pay | Admitting: Cardiology

## 2019-07-10 VITALS — BP 129/77 | HR 80 | Ht 65.0 in | Wt 130.0 lb

## 2019-07-10 DIAGNOSIS — I2510 Atherosclerotic heart disease of native coronary artery without angina pectoris: Secondary | ICD-10-CM | POA: Diagnosis not present

## 2019-07-10 DIAGNOSIS — E7800 Pure hypercholesterolemia, unspecified: Secondary | ICD-10-CM

## 2019-07-10 DIAGNOSIS — R002 Palpitations: Secondary | ICD-10-CM | POA: Diagnosis not present

## 2019-07-10 LAB — T-HELPER CELL (CD4) - (RCID CLINIC ONLY)
CD4 % Helper T Cell: 9 % — ABNORMAL LOW (ref 33–65)
CD4 T Cell Abs: 50 /uL — ABNORMAL LOW (ref 400–1790)

## 2019-07-10 NOTE — Patient Instructions (Signed)
Medication Instructions:  NO CHANGE *If you need a refill on your cardiac medications before your next appointment, please call your pharmacy*   Lab Work: If you have labs (blood work) drawn today and your tests are completely normal, you will receive your results only by: . MyChart Message (if you have MyChart) OR . A paper copy in the mail If you have any lab test that is abnormal or we need to change your treatment, we will call you to review the results.   Follow-Up: At [facility], you and your health needs are our priority.  As part of our continuing mission to provide you with exceptional heart care, we have created designated Provider Care Teams.  These Care Teams include your primary Cardiologist (physician) and Advanced Practice Providers (APPs -  Physician Assistants and Nurse Practitioners) who all work together to provide you with the care you need, when you need it.  We recommend signing up for the patient portal called "MyChart".  Sign up information is provided on this After Visit Summary.  MyChart is used to connect with patients for Virtual Visits (Telemedicine).  Patients are able to view lab/test results, encounter notes, upcoming appointments, etc.  Non-urgent messages can be sent to your provider as well.   To learn more about what you can do with MyChart, go to [email or web address].    Your next appointment:   12 month(s)  The format for your next appointment:   In Person  Provider:   You may see BRIAN CRENSHAW MD or one of the following Advanced Practice Providers on your designated Care Team:    Luke Kilroy, PA-C  Callie Goodrich, PA-C  Jesse Cleaver, FNP     

## 2019-07-12 LAB — HIV-1 RNA QUANT-NO REFLEX-BLD
HIV 1 RNA Quant: 82 copies/mL — ABNORMAL HIGH
HIV-1 RNA Quant, Log: 1.91 Log copies/mL — ABNORMAL HIGH

## 2019-07-14 ENCOUNTER — Telehealth: Payer: Self-pay | Admitting: *Deleted

## 2019-07-14 ENCOUNTER — Encounter: Payer: Self-pay | Admitting: *Deleted

## 2019-07-14 DIAGNOSIS — E7800 Pure hypercholesterolemia, unspecified: Secondary | ICD-10-CM

## 2019-07-14 MED ORDER — ROSUVASTATIN CALCIUM 40 MG PO TABS: 40.0000 mg | ORAL_TABLET | Freq: Every day | ORAL | 3 refills | Status: DC

## 2019-07-14 NOTE — Telephone Encounter (Addendum)
My chart message to patient regarding recommendations. New script sent to the pharmacy  Lab orders mailed to the pt    ----- Message from Lewayne Bunting, MD sent at 07/14/2019 10:16 AM EDT ----- Begin crestor 40 mg daily; lipids and liver 12 weeks Olga Millers ----- Message ----- From: Freddi Starr, RN Sent: 07/14/2019  10:11 AM EDT To: Lewayne Bunting, MD   ----- Message ----- From: Rosalee Kaufman, RPH-CPP Sent: 07/14/2019   7:56 AM EDT To: Freddi Starr, RN  Rosuvastatin looks like it would be fine.  Some of the other statins do have interactions with her meds.  Belenda Cruise ----- Message ----- From: Freddi Starr, RN Sent: 07/14/2019   7:47 AM EDT To: [facility] Pharmd  Please help determine what statin would be acceptable with her current medications. thanks

## 2019-07-17 ENCOUNTER — Telehealth: Payer: Self-pay

## 2019-07-17 NOTE — Telephone Encounter (Signed)
No drug interactions.Marland Kitchen all good to take!

## 2019-07-17 NOTE — Telephone Encounter (Signed)
Patient messaged triage team to inquire about possible drug interaction with her migraine medication, rizatriptan and her biktarvy. Forwarding to pharmacy team to check.  Andreanna Mikolajczak [place] Mast, RN

## 2019-08-24 MED ORDER — SULFAMETHOXAZOLE-TRIMETHOPRIM 400-80 MG PO TABS: 1.0000 | ORAL_TABLET | Freq: Every day | ORAL | 4 refills | Status: DC

## 2019-09-21 DIAGNOSIS — A630 Anogenital (venereal) warts: Secondary | ICD-10-CM

## 2019-10-22 ENCOUNTER — Other Ambulatory Visit: Payer: Self-pay

## 2019-10-22 ENCOUNTER — Encounter: Payer: Self-pay | Admitting: Infectious Diseases

## 2019-10-22 ENCOUNTER — Ambulatory Visit: Payer: 59 | Admitting: Infectious Diseases

## 2019-10-22 VITALS — BP 127/80 | HR 76 | Temp 98.3°F | Wt 141.0 lb

## 2019-10-22 DIAGNOSIS — B20 Human immunodeficiency virus [HIV] disease: Secondary | ICD-10-CM

## 2019-10-22 DIAGNOSIS — B977 Papillomavirus as the cause of diseases classified elsewhere: Secondary | ICD-10-CM | POA: Insufficient documentation

## 2019-10-22 DIAGNOSIS — R87619 Unspecified abnormal cytological findings in specimens from cervix uteri: Secondary | ICD-10-CM

## 2019-10-22 DIAGNOSIS — Z1152 Encounter for screening for COVID-19: Secondary | ICD-10-CM

## 2019-10-22 DIAGNOSIS — Z23 Encounter for immunization: Secondary | ICD-10-CM | POA: Diagnosis not present

## 2019-10-22 DIAGNOSIS — F329 Major depressive disorder, single episode, unspecified: Secondary | ICD-10-CM

## 2019-10-22 DIAGNOSIS — Z7185 Encounter for immunization safety counseling: Secondary | ICD-10-CM | POA: Diagnosis not present

## 2019-10-22 NOTE — Assessment & Plan Note (Signed)
Her visit with Dr. 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  She has follow-up with her for monitoring.

## 2019-10-22 NOTE — Assessment & Plan Note (Signed)
She has a follow-up study at the beginning of the year with her gynecologist.  If persistent HPV or abnormal cytology would recommend colposcopy to investigate further.

## 2019-10-22 NOTE — Assessment & Plan Note (Signed)
We will give her her flu shot today.  We spent a good amount of time discussing Covid vaccine recommendations.  If her CD4 count remains less than 200 I would recommend she consider getting the 3 dose regimen recommended for those with immunocompromised conditions for maximal benefit and antibody response.  She would like to check if she has SARS-CoV-2 IgG antibodies, given that her husband had Covid infection while they lived together and was asymptomatic then.  It is not well understood at this time with those antibody levels mean regarding levels of protection and at what level somebody is vulnerable again, but we can check with blood draw today.

## 2019-10-22 NOTE — Assessment & Plan Note (Signed)
Doing well on once daily Biktarvy.  We will update pertinent labs today.  Given her findings of CAD on coronary CT and strong family history of cardiac events, we discussed balancing the benefits of treatment and long-term side effects of medication.  I think the best course for Stacy Watkins would be to switch her medicine to once daily Dovato.  She will need hepatitis B booster series.

## 2019-10-22 NOTE — Patient Instructions (Signed)
Nice to see you as always.   Continue your Biktarvy once a day for now.   Long term it may be best to get you on a medication called DOVATO is the other once a day pill that may be best for you long term.  The benefit would be to remove the risk for side effects on your cholesterol related to one of the medicines in [place].   Will give you your flu shot today.   If you would like to get the COVID vaccine if your CD4 is still < 200 I would recommend the 3 doses for the best immune response to it.   We can get this for you here if you prefer.   Would like to see you back in 4 months.

## 2019-10-22 NOTE — Progress Notes (Signed)
Patient: Stacy Watkins  DOB: 1960/09/22 MRN: [id number] PCP: Carol Ada, MD    Patient Active Problem List   Diagnosis Date Noted  . Vaccine counseling 10/22/2019  . 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 Abnormal Pap smear of cervix 07/09/2019  . AIDS (acquired immune deficiency syndrome) ([place]) 06/15/2019  . Healthcare maintenance 06/15/2019  . HIV disease ([place]) 06/08/2019  . Paresthesia of upper and lower extremity 04/26/2017  . 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 Upper airway cough syndrome 07/19/2016  . Headache   . PURE HYPERCHOLESTEROLEMIA 01/24/2009  . CAROTID STENOSIS 12/10/2008     Subjective:  Brief Narrative: Stacy Watkins is a 59 y.o. female with HIV, AIDS+ Dx 05/2019 CD4 nadir < 35 HIV Risk: Heterosexual OI: oral thrush Intake Labs: Hep B sAg (-), sAb (-), cAb (-), Hep A Ab (-), Hep C Ab (-) Quantiferon (-) HLA B*5701 (pending)  Previous Regimens:  Biktarvy >> suppressed   Genotype:   06-2019 - wildtype   Chief Complaint  Patient presents with  . Follow-up    discuss flu shot      HPI: Stacy Watkins is here for routine follow-up.  She reports an overall increase in energy.  She feels like she has gained some weight, hopeful that is a good thing.  Stress has been much better at home now that she has her own place.  She continues to work with a therapist is very helpful for her.  Surrounded by family and friends that are very supportive.  Continues to take Biktarvy once a day without any lapse in doses.  No trouble with access to her medication.  She has some questions about the flu shot and whether she should consider getting this done if it safe.  Also would like to discuss the Covid vaccine recommendations.  She was seen by Dr. Marcello Moores at [place] for anoscopy due to HPV on rectum.  She does have follow-up arranged with her in a few months to follow closely but no intervention necessary at this time.  She does have a Pap smear scheduled again at the beginning  of the year to follow-up abnormal test results a few months ago.   Review of Systems  Constitutional: Positive for unexpected weight change (noticed some gain ). Negative for appetite change, chills, fatigue and fever.  HENT: Negative for mouth sores, sore throat and trouble swallowing.   Eyes: Negative for pain and visual disturbance.  Respiratory: Negative for cough and shortness of breath.   Cardiovascular: Negative for chest pain.  Gastrointestinal: Negative for abdominal pain, diarrhea and nausea.  Genitourinary: Negative for dysuria, menstrual problem and pelvic pain.  Musculoskeletal: Negative for back pain and neck pain.  Skin: Negative for color change and rash.  Neurological: Negative for weakness, numbness and headaches.  Hematological: Negative for adenopathy.  Psychiatric/Behavioral: Negative for dysphoric mood. The patient is not nervous/anxious.      Past Medical History:  Diagnosis Date  . Anxiety   . Depression   . Depression with anxiety   . Headache   . HIV disease ([place]) 06/08/2019  . Seasonal allergies   . Situational stress     Outpatient Medications Prior to Visit  Medication Sig Dispense Refill  . aspirin 81 MG tablet Take 81 mg by mouth daily.    Marland Kitchen atenolol (TENORMIN) 50 MG tablet Take 50 mg by mouth daily.     . bictegravir-emtricitabine-tenofovir AF (BIKTARVY) 50-200-25 MG TABS tablet Take 1 tablet by mouth daily.  30 tablet 11  . butalbital-acetaminophen-caffeine (FIORICET, ESGIC) 50-325-40 MG tablet Take 1 tablet by mouth every 6 (six) hours as needed for migraine.     Marland Kitchen CALCIUM PO Take by mouth.    . Cholecalciferol (VITAMIN D PO) Take by mouth.    . MULTIPLE VITAMIN PO Take 1 tablet by mouth daily.     . NON FORMULARY 75 mg. Pregnenolone    . nortriptyline (PAMELOR) 50 MG capsule Take 50 mg by mouth daily.     . Probiotic Product (ALIGN PO) Take 1 capsule by mouth daily.    . rizatriptan (MAXALT) 10 MG tablet Take 10 mg by mouth as needed for  migraine. May repeat in 2 hours if needed    . sulfamethoxazole-trimethoprim (BACTRIM) 400-80 MG tablet Take 1 tablet by mouth daily. 30 tablet 4  . valACYclovir (VALTREX) 1000 MG tablet Take 1 tablet by mouth daily.    Marland Kitchen VITAMIN A PO Take by mouth.    Marland Kitchen VITAMIN K PO Take by mouth.    . zolpidem (AMBIEN) 10 MG tablet Take 5 mg by mouth at bedtime as needed for sleep.     . fluconazole (DIFLUCAN) 100 MG tablet Take 1 tablet (100 mg total) by mouth daily. (Patient not taking: Reported on 10/22/2019) 7 tablet 1  . Omega-3 Fatty Acids (FISH OIL PO) Take by mouth. (Patient not taking: Reported on 10/22/2019)    . rosuvastatin (CRESTOR) 40 MG tablet Take 1 tablet (40 mg total) by mouth daily. 90 tablet 3   No facility-administered medications prior to visit.     Allergies  Allergen Reactions  . Dust Mite Extract Other (See Comments)  . Mold Extract  [Trichophyton] Other (See Comments)    Social History   Tobacco Use  . Smoking status: Never Smoker  . Smokeless tobacco: Never Used  Vaping Use  . Vaping Use: Never used  Substance Use Topics  . Alcohol use: No  . Drug use: No     Objective:   Vitals:   10/22/19 1012  BP: 127/80  Pulse: 76  Temp: 98.3 F (36.8 C)  TempSrc: Oral  Weight: 141 lb (64 kg)   Body mass index is 23.46 kg/m.   Physical Exam Constitutional:      Appearance: Normal appearance. She is not ill-appearing.     Comments: Well-appearing today.   HENT:     Mouth/Throat:     Mouth: Mucous membranes are moist.     Pharynx: Oropharynx is clear.  Eyes:     General: No scleral icterus. Pulmonary:     Effort: Pulmonary effort is normal.  Neurological:     Mental Status: She is alert and oriented to person, place, and time.  Psychiatric:        Mood and Affect: Mood normal.        Thought Content: Thought content normal.      Lab Results: Lab Results  Component Value Date   WBC 1.9 (L) 06/08/2019   HGB 13.2 06/08/2019   HCT 38.6 06/08/2019    MCV 93.0 06/08/2019   PLT 143 06/08/2019    Lab Results  Component Value Date   CREATININE 1.03 06/11/2019   BUN 22 06/11/2019   NA 137 06/11/2019   K 4.4 06/11/2019   CL 103 06/11/2019   CO2 27 06/11/2019    Lab Results  Component Value Date   ALT 19 06/11/2019   AST 25 06/11/2019   BILITOT 0.3 06/11/2019     Assessment &  Plan:   Problem List Items Addressed This Visit      Unprioritized   Vaccine counseling    We will give her her flu shot today.  We spent a good amount of time discussing Covid vaccine recommendations.  If her CD4 count remains less than 200 I would recommend she consider getting the 3 dose regimen recommended for those with immunocompromised conditions for maximal benefit and antibody response.  She would like to check if she has SARS-CoV-2 IgG antibodies, given that her husband had Covid infection while they lived together and was asymptomatic then.  It is not well understood at this time with those antibody levels mean regarding levels of protection and at what level somebody is vulnerable again, but we can check with blood draw today.      HPV in female    Her visit with Dr. 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  She has follow-up with her for monitoring.      HIV disease ([place]) - Primary    Doing well on once daily Biktarvy.  We will update pertinent labs today.  Given her findings of CAD on coronary CT and strong family history of cardiac events, we discussed balancing the benefits of treatment and long-term side effects of medication.  I think the best course for Stacy Watkins would be to switch her medicine to once daily Dovato.  She will need hepatitis B booster series.       Relevant Orders   HIV-1 RNA quant-no reflex-bld   T-helper cell (CD4)- ([facility] clinic only)   Lipid panel   AIDS (acquired immune deficiency syndrome) ([place])    She is showing signs of immune recovery with weight regain, improvement in energy and resolution of  thrush.  No other opportunistic process identified today.  Would continue the once daily Bactrim until CD4 greater than 200 x 3 months.  We will update labs today.      Abnormal Pap smear of cervix    She has a follow-up study at the beginning of the year with her gynecologist.  If persistent HPV or abnormal cytology would recommend colposcopy to investigate further.       Other Visit Diagnoses    Encounter for screening for COVID-19       Relevant Orders   SAR CoV2 Serology (COVID 19)AB(IGG)IA   Need for immunization against influenza       Relevant Orders   Flu Vaccine QUAD 36+ mos IM (Completed)     Return in about 4 months (around 02/22/2020).   Janene Madeira, MSN, NP-C [facility] for Infectious [place] Pager: [phone] Office: [phone]

## 2019-10-22 NOTE — Assessment & Plan Note (Signed)
She is showing signs of immune recovery with weight regain, improvement in energy and resolution of thrush.  No other opportunistic process identified today.  Would continue the once daily Bactrim until CD4 greater than 200 x 3 months.  We will update labs today.

## 2019-10-23 LAB — T-HELPER CELL (CD4) - (RCID CLINIC ONLY)
CD4 % Helper T Cell: 11 % — ABNORMAL LOW (ref 33–65)
CD4 T Cell Abs: 80 /uL — ABNORMAL LOW (ref 400–1790)

## 2019-10-25 LAB — LIPID PANEL
Cholesterol: 160 mg/dL (ref <200)
HDL: 45 mg/dL — ABNORMAL LOW (ref >=50)
LDL Cholesterol (Calc): 92 mg/dL (calc)
Non-HDL Cholesterol (Calc): 115 mg/dL (calc) (ref <130)
Total CHOL/HDL Ratio: 3.6 (calc) (ref <5.0)
Triglycerides: 129 mg/dL (ref <150)

## 2019-10-25 LAB — SARS-COV-2 ANTIBODY(IGG)SPIKE,SEMI-QUANTITATIVE: SARS COV1 AB(IGG)SPIKE,SEMI QN: <1 index (ref <1.00)

## 2019-10-25 LAB — HIV-1 RNA QUANT-NO REFLEX-BLD
HIV 1 RNA Quant: 22 Copies/mL — ABNORMAL HIGH
HIV-1 RNA Quant, Log: 1.34 Log cps/mL — ABNORMAL HIGH

## 2019-11-18 MED ORDER — SULFAMETHOXAZOLE-TRIMETHOPRIM 400-80 MG PO TABS: 1.0000 | ORAL_TABLET | Freq: Every day | ORAL | 2 refills | Status: DC

## 2019-11-18 NOTE — Addendum Note (Signed)
Addended by: Blanchard Kelch on: 11/18/2019 10:41 AM   Modules accepted: Orders

## 2019-11-24 ENCOUNTER — Ambulatory Visit: Payer: 59

## 2019-11-24 ENCOUNTER — Other Ambulatory Visit: Payer: Self-pay

## 2020-01-07 ENCOUNTER — Ambulatory Visit: Payer: 59

## 2020-01-12 LAB — HM PAP SMEAR

## 2020-01-25 ENCOUNTER — Ambulatory Visit: Payer: 59

## 2020-01-25 ENCOUNTER — Other Ambulatory Visit: Payer: Self-pay

## 2020-01-26 DIAGNOSIS — B20 Human immunodeficiency virus [HIV] disease: Secondary | ICD-10-CM

## 2020-01-26 DIAGNOSIS — R5383 Other fatigue: Secondary | ICD-10-CM

## 2020-01-26 DIAGNOSIS — Z79899 Other long term (current) drug therapy: Secondary | ICD-10-CM

## 2020-01-28 NOTE — Addendum Note (Signed)
Addended by: Blanchard Kelch on: 01/28/2020 11:35 AM   Modules accepted: Orders

## 2020-02-04 ENCOUNTER — Other Ambulatory Visit: Payer: Self-pay

## 2020-02-04 ENCOUNTER — Other Ambulatory Visit: Payer: 59

## 2020-02-04 ENCOUNTER — Other Ambulatory Visit: Payer: Self-pay | Admitting: Infectious Diseases

## 2020-02-04 DIAGNOSIS — Z79899 Other long term (current) drug therapy: Secondary | ICD-10-CM

## 2020-02-04 DIAGNOSIS — B20 Human immunodeficiency virus [HIV] disease: Secondary | ICD-10-CM

## 2020-02-04 DIAGNOSIS — R5383 Other fatigue: Secondary | ICD-10-CM

## 2020-02-05 ENCOUNTER — Encounter: Payer: Self-pay | Admitting: *Deleted

## 2020-02-05 LAB — T-HELPER CELL (CD4) - (RCID CLINIC ONLY)
CD4 % Helper T Cell: 11 % — ABNORMAL LOW (ref 33–65)
CD4 T Cell Abs: 67 /uL — ABNORMAL LOW (ref 400–1790)

## 2020-02-11 ENCOUNTER — Other Ambulatory Visit: Payer: Self-pay

## 2020-02-11 ENCOUNTER — Ambulatory Visit (INDEPENDENT_AMBULATORY_CARE_PROVIDER_SITE_OTHER): Payer: 59 | Admitting: Infectious Diseases

## 2020-02-11 ENCOUNTER — Encounter: Payer: Self-pay | Admitting: Infectious Diseases

## 2020-02-11 VITALS — BP 147/77 | HR 83 | Temp 98.2°F | Wt 147.0 lb

## 2020-02-11 DIAGNOSIS — G479 Sleep disorder, unspecified: Secondary | ICD-10-CM

## 2020-02-11 DIAGNOSIS — Z7185 Encounter for immunization safety counseling: Secondary | ICD-10-CM

## 2020-02-11 DIAGNOSIS — B20 Human immunodeficiency virus [HIV] disease: Secondary | ICD-10-CM

## 2020-02-11 DIAGNOSIS — B977 Papillomavirus as the cause of diseases classified elsewhere: Secondary | ICD-10-CM

## 2020-02-11 LAB — HEPATIC FUNCTION PANEL
AG Ratio: 2 (calc) (ref 1.0–2.5)
ALT: 19 U/L (ref 6–29)
AST: 25 U/L (ref 10–35)
Albumin: 4.4 g/dL (ref 3.6–5.1)
Alkaline phosphatase (APISO): 60 U/L (ref 37–153)
Bilirubin, Direct: 0.1 mg/dL (ref 0.0–0.2)
Globulin: 2.2 g/dL (calc) (ref 1.9–3.7)
Indirect Bilirubin: 0.3 mg/dL (calc) (ref 0.2–1.2)
Total Bilirubin: 0.4 mg/dL (ref 0.2–1.2)
Total Protein: 6.6 g/dL (ref 6.1–8.1)

## 2020-02-11 LAB — VITAMIN D 25 HYDROXY (VIT D DEFICIENCY, FRACTURES): Vit D, 25-Hydroxy: 93 ng/mL (ref 30–100)

## 2020-02-11 LAB — LIPID PANEL
Cholesterol: 78 mg/dL (ref <200)
HDL: 39 mg/dL — ABNORMAL LOW (ref >=50)
LDL Cholesterol (Calc): 22 mg/dL (calc)
Non-HDL Cholesterol (Calc): 39 mg/dL (calc) (ref <130)
Total CHOL/HDL Ratio: 2 (calc) (ref <5.0)
Triglycerides: 83 mg/dL (ref <150)

## 2020-02-11 LAB — IRON,TIBC AND FERRITIN PANEL
%SAT: 29 % (calc) (ref 16–45)
Ferritin: 31 ng/mL (ref 16–232)
Iron: 100 ug/dL (ref 45–160)
TIBC: 346 mcg/dL (calc) (ref 250–450)

## 2020-02-11 LAB — HIV-1 RNA QUANT-NO REFLEX-BLD

## 2020-02-11 LAB — HEMOGLOBIN: Hemoglobin: 14.9 g/dL (ref 11.7–15.5)

## 2020-02-11 NOTE — Patient Instructions (Addendum)
Fostemsivir Charissa Bash) is the medication I may want to try for you to see if it helps offer a better rebuild of your immune system.   Will look into a possible option for a daily anxiety medication that won't interact with the nortriptyline - your PCP may be a better person to ask though since they probably treat that more often.   Please consider Evusheld for covid prevention - see your mychart.   Stop by the lab on your way out - will see you back in 3 months as well. We can do lab work at your visit or a week before if you prefer to have them ready then.

## 2020-02-11 NOTE — Progress Notes (Signed)
Patient: Stacy Watkins  DOB: Jan 25, 1960 MRN: [id number] PCP: Carol Ada, MD     Subjective:  Brief Narrative: Stacy Watkins is a 60 y.o. female with HIV, AIDS+ Dx 05/2019 CD4 nadir < 35 HIV Risk: Heterosexual OI: oral thrush Intake Labs: Hep B sAg (-), sAb (-), cAb (-), Hep A Ab (-), Hep C Ab (-) Quantiferon (-) HLA B*5701 (pending)  Previous Regimens:  Biktarvy >> suppressed   Genotype:   06-2019 - wildtype   Chief Complaint  Patient presents with  . Follow-up    B20      HPI: Stacy Watkins is here for routine follow-up. She has continued on Biktarvy once a day without any dose interruption. She takes this every day with other prescriptions and then supplements > 6 hours after in the evening. No trouble with side effects.   She is very worried today. Having trouble with fatigue - referred to them as "epstein barr spells" previously. Really felt that she started turning a corner up until the beginning of the year. Sleep has always been challenging for her as well and more so recently. Takes a xanax to help her get back to sleep (around 0200).   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 Following with general surgery as well for perirectal hpv as well. Some lesions removed, some remain.     Review of Systems  Constitutional: Positive for unexpected weight change (noticed some gain ). Negative for appetite change, chills, fatigue and fever.  HENT: Negative for mouth sores, sore throat and trouble swallowing.   Eyes: Negative for pain and visual disturbance.  Respiratory: Negative for cough and shortness of breath.   Cardiovascular: Negative for chest pain.  Gastrointestinal: Negative for abdominal pain, diarrhea and nausea.  Genitourinary: Negative for dysuria, menstrual problem and pelvic pain.  Musculoskeletal: Negative for back pain and neck pain.  Skin: Negative for color change and rash.  Neurological:  Negative for weakness, numbness and headaches.  Hematological: Negative for adenopathy.  Psychiatric/Behavioral: Negative for dysphoric mood. The patient is not nervous/anxious.      Past Medical History:  Diagnosis Date  . Anxiety   . Depression   . Depression with anxiety   . Headache   . HIV disease ([place]) 06/08/2019  . Seasonal allergies   . Situational stress     Outpatient Medications Prior to Visit  Medication Sig Dispense Refill  . ALPRAZolam (XANAX) 0.5 MG tablet 1/2 - 1 tablet    . aspirin 81 MG tablet Take 81 mg by mouth daily.    Marland Kitchen atenolol (TENORMIN) 50 MG tablet Take 50 mg by mouth daily.     . bictegravir-emtricitabine-tenofovir AF (BIKTARVY) 50-200-25 MG TABS tablet Take 1 tablet by mouth daily. 30 tablet 11  . butalbital-acetaminophen-caffeine (FIORICET, ESGIC) 50-325-40 MG tablet Take 1 tablet by mouth every 6 (six) hours as needed for migraine.     Marland Kitchen CALCIUM PO Take by mouth.    . Cholecalciferol (VITAMIN D PO) Take by mouth.    . cyclobenzaprine (FLEXERIL) 10 MG tablet TAKE ONE TABLET BY MOUTH 3 TIMES A DAY AS NEEDED.    Marland Kitchen EMGALITY 120 MG/ML SOAJ Inject into the skin.    . fluconazole (DIFLUCAN) 100 MG tablet Take 1 tablet (100 mg total) by mouth daily. 7 tablet 1  . ivermectin (STROMECTOL) 3 MG TABS tablet Take 5 tablets by mouth once a week.    . MULTIPLE VITAMIN PO Take  1 tablet by mouth daily.     . NON FORMULARY 75 mg. Pregnenolone    . nortriptyline (PAMELOR) 50 MG capsule Take 50 mg by mouth daily.     . Omega-3 Fatty Acids (FISH OIL PO) Take by mouth.    . Probiotic Product (ALIGN PO) Take 1 capsule by mouth daily.    . rizatriptan (MAXALT) 10 MG tablet Take 10 mg by mouth as needed for migraine. May repeat in 2 hours if needed    . sulfamethoxazole-trimethoprim (BACTRIM) 400-80 MG tablet Take 1 tablet by mouth daily. 30 tablet 2  . valACYclovir (VALTREX) 1000 MG tablet Take 1 tablet by mouth daily.    Marland Kitchen VITAMIN A PO Take by mouth.    Marland Kitchen VITAMIN K PO  Take by mouth.    . zolpidem (AMBIEN) 10 MG tablet Take 5 mg by mouth at bedtime as needed for sleep.     . rosuvastatin (CRESTOR) 40 MG tablet Take 1 tablet (40 mg total) by mouth daily. 90 tablet 3   No facility-administered medications prior to visit.     Allergies  Allergen Reactions  . Dust Mite Extract Other (See Comments)  . Mold Extract  [Trichophyton] Other (See Comments)  . Trazodone Hcl Other (See Comments)    Social History   Tobacco Use  . Smoking status: Never Smoker  . Smokeless tobacco: Never Used  Vaping Use  . Vaping Use: Never used  Substance Use Topics  . Alcohol use: No  . Drug use: No     Objective:   Vitals:   02/11/20 1056  BP: (!) 147/77  Pulse: 83  Temp: 98.2 F (36.8 C)  TempSrc: Oral  Weight: 147 lb (66.7 kg)   Body mass index is 24.46 kg/m.   Physical Exam Constitutional:      Appearance: Normal appearance. She is not ill-appearing.     Comments: Well-appearing today.   HENT:     Mouth/Throat:     Mouth: Mucous membranes are moist.     Pharynx: Oropharynx is clear.  Eyes:     General: No scleral icterus. Pulmonary:     Effort: Pulmonary effort is normal.  Neurological:     Mental Status: She is alert and oriented to person, place, and time.  Psychiatric:        Mood and Affect: Mood normal.        Thought Content: Thought content normal.      Lab Results: Lab Results  Component Value Date   WBC 1.9 (L) 06/08/2019   HGB 14.9 02/04/2020   HCT 38.6 06/08/2019   MCV 93.0 06/08/2019   PLT 143 06/08/2019    Lab Results  Component Value Date   CREATININE 1.03 06/11/2019   BUN 22 06/11/2019   NA 137 06/11/2019   K 4.4 06/11/2019   CL 103 06/11/2019   CO2 27 06/11/2019    Lab Results  Component Value Date   ALT 19 02/04/2020   AST 25 02/04/2020   BILITOT 0.4 02/04/2020     Assessment & Plan:   Problem List Items Addressed This Visit      Unprioritized   Vaccine counseling    We discussed Evusheld  injection for COVID prophylaxis. She is leery about adverse effect to COVID vaccine and I worry she will not have a robust enough response given her CD4 count.  She will consider.       HPV in female    Upcoming D&C with possible polyp removal.  Following with GYN surgery and General surgery for surveillance/interventions.  Her largest risk reduction in healing from procedure is constant antiretroviral adherence, which Stacy Watkins has done very well with. While her CD4 count has been slow to recover she should receive any necessary care for early intervention that her surgical teams feel would improve QOL and decrease any dysplastic changes in the future.       HIV disease ([place]) - Primary    Will re-draw VL today - specimen was apparently misplaced.  Continue Biktarvy once daily.       Relevant Orders   HIV-1 RNA quant-no reflex-bld   Difficulty sleeping    Has always had trouble with this. Transitioned off Ambien relatively recently. It sounds like she goes to sleep without much trouble but cannot stay asleep, anxiety seems to be contributing. Don't want to add SSRI with nortriptyline with drug interactions. She will try xanax (other prescribe) to at onset of sleep to see if that helps for now, but I would prefer to have her on something not as habit forming. Possibly hydroxyzine? PCP would likely be a better person to talk with over this.  She has a night routine, meditates, exercises and has good nutrition.       AIDS (acquired immune deficiency syndrome) ([place])    Slow to recover CD4, but not on therapy for very long.  Much of our discussion was spent around this. Most significant risk reduction is her adherence to ARVs. Keeping her viral load undetectable is the most important intervention. CD4 will take a few years to build back in some, and in some cases people don't recover beyond 200. Will continue to have her focus on adequate sleep (plan in place), stress reduction, counseling/support  sessions for her and good nutrition/exercise.  Continue daily bactrim for OI prophylaxis   Could consider adding Fostemsavir bid given unique mechanism to increase CD4 counts as seen in the [facility] trial.          Return in about 3 months (around 05/10/2020).   Janene Madeira, MSN, NP-C [facility] [place] Pager: [phone] Office: [phone]

## 2020-02-12 ENCOUNTER — Telehealth: Payer: Self-pay

## 2020-02-12 DIAGNOSIS — G479 Sleep disorder, unspecified: Secondary | ICD-10-CM | POA: Insufficient documentation

## 2020-02-12 NOTE — Assessment & Plan Note (Signed)
Upcoming D&C with possible polyp removal. Following with GYN surgery and General surgery for surveillance/interventions.  Her largest risk reduction in healing from procedure is constant antiretroviral adherence, which Stacy Watkins has done very well with. While her CD4 count has been slow to recover she should receive any necessary care for early intervention that her surgical teams feel would improve QOL and decrease any dysplastic changes in the future.

## 2020-02-12 NOTE — Assessment & Plan Note (Signed)
Slow to recover CD4, but not on therapy for very long.  Much of our discussion was spent around this. Most significant risk reduction is her adherence to ARVs. Keeping her viral load undetectable is the most important intervention. CD4 will take a few years to build back in some, and in some cases people don't recover beyond 200. Will continue to have her focus on adequate sleep (plan in place), stress reduction, counseling/support sessions for her and good nutrition/exercise.  Continue daily bactrim for OI prophylaxis   Could consider adding Fostemsavir bid given unique mechanism to increase CD4 counts as seen in the [facility] trial.

## 2020-02-12 NOTE — Telephone Encounter (Signed)
   [facility] Pre-operative Risk Assessment    Request for surgical clearance:  1. What type of surgery is being performed? Hyst D&C, Possible Polyp Removal  2. When is this surgery scheduled? 03/22/20 or 03/31/20  3. What type of clearance is required (medical clearance vs. Pharmacy clearance to hold med vs. Both)? medical  4. Are there any medications that need to be held prior to surgery and how long? N/A  5. Practice name and name of physician performing surgery? [facility] / Everlene Farrier, MD  6. What is the office phone number? [phone]   7.   What is the office fax number? [phone]  8.   Anesthesia type (None, local, MAC, general) ? Propofol or general   Sherrie Mustache 02/12/2020, 9:46 AM  _________________________________________________________________   (provider comments below)

## 2020-02-12 NOTE — Assessment & Plan Note (Signed)
Has always had trouble with this. Transitioned off Ambien relatively recently. It sounds like she goes to sleep without much trouble but cannot stay asleep, anxiety seems to be contributing. Don't want to add SSRI with nortriptyline with drug interactions. She will try xanax (other prescribe) to at onset of sleep to see if that helps for now, but I would prefer to have her on something not as habit forming. Possibly hydroxyzine? PCP would likely be a better person to talk with over this.  She has a night routine, meditates, exercises and has good nutrition.

## 2020-02-12 NOTE — Telephone Encounter (Signed)
   Primary Cardiologist: Olga Millers, MD  Chart reviewed as part of pre-operative protocol coverage. Patient was contacted 02/12/2020 in reference to pre-operative risk assessment for pending surgery as outlined below.  Stacy Watkins was last seen on 07/10/19 by Dr. Jens Som with cardiac hx to include syncope 2018 felt orthostatic, normal EF and unrevealing monitor at that time, and coronary CTA 06/2019 with mild nonobstructive plaque. RCRI 0.4% indicating low risk of CV complications. She reports she is doing well. Per our discussion she is able to achieve over 4 METS without any concerning cardiac symptoms. Therefore, based on ACC/AHA guidelines, the patient would be at acceptable risk for the planned procedure without further cardiovascular testing. The patient was advised that if she develops new symptoms prior to surgery to contact our office to arrange for a follow-up visit, and she verbalized understanding. There is no history noted of MI, PCI, CABG so from cardiac standpoint, no contraindication identified at this time to holding aspirin if needed for this procedure.  I will route this recommendation to the requesting party via Epic fax function and remove from pre-op pool. Please call with questions.  Stacy Montana, PA-C 02/12/2020, 10:36 AM

## 2020-02-12 NOTE — Assessment & Plan Note (Signed)
We discussed Evusheld injection for COVID prophylaxis. She is leery about adverse effect to COVID vaccine and I worry she will not have a robust enough response given her CD4 count.  She will consider.

## 2020-02-12 NOTE — Assessment & Plan Note (Signed)
Will re-draw VL today - specimen was apparently misplaced.  Continue Biktarvy once daily.

## 2020-02-13 ENCOUNTER — Other Ambulatory Visit: Payer: Self-pay | Admitting: Infectious Diseases

## 2020-02-13 LAB — HIV-1 RNA QUANT-NO REFLEX-BLD
HIV 1 RNA Quant: <20 Copies/mL
HIV 1 RNA Quant: 26 Copies/mL — ABNORMAL HIGH
HIV-1 RNA Quant, Log: <1.3 Log cps/mL
HIV-1 RNA Quant, Log: 1.42 Log cps/mL — ABNORMAL HIGH

## 2020-02-15 ENCOUNTER — Other Ambulatory Visit: Payer: Self-pay | Admitting: Infectious Diseases

## 2020-02-15 ENCOUNTER — Ambulatory Visit: Payer: 59 | Admitting: Infectious Diseases

## 2020-02-15 DIAGNOSIS — B20 Human immunodeficiency virus [HIV] disease: Secondary | ICD-10-CM

## 2020-02-15 NOTE — Progress Notes (Signed)
Evusheld prophylaxis ordered given advanced HIV.

## 2020-02-16 ENCOUNTER — Ambulatory Visit: Payer: 59 | Admitting: Infectious Diseases

## 2020-02-17 ENCOUNTER — Telehealth: Payer: Self-pay | Admitting: Adult Health

## 2020-02-17 NOTE — Telephone Encounter (Signed)
Called patient to discuss Evusheld for COVID 19 pre exposure prophylaxis.  LMOM for her to call me back.    Lillard Anes, NP

## 2020-02-21 DIAGNOSIS — E7800 Pure hypercholesterolemia, unspecified: Secondary | ICD-10-CM

## 2020-02-22 ENCOUNTER — Other Ambulatory Visit: Payer: Self-pay | Admitting: Physician Assistant

## 2020-02-22 DIAGNOSIS — B20 Human immunodeficiency virus [HIV] disease: Secondary | ICD-10-CM

## 2020-02-22 MED ORDER — ROSUVASTATIN CALCIUM 20 MG PO TABS: 20.0000 mg | ORAL_TABLET | Freq: Every day | ORAL | 0 refills | Status: DC

## 2020-02-22 NOTE — Telephone Encounter (Signed)
Second attempt made to reach pt today. I did send her a mychart message with more info and how to reach me.   [name] PA-C  [facility]

## 2020-02-22 NOTE — Progress Notes (Signed)
I connected by phone with Stacy Watkins on 02/22/2020, 2:16 PM to discuss the potential use of a new treatment, tixagevimab/cilgavimab, for pre-exposure prophylaxis for prevention of coronavirus disease 2019 (COVID-19) caused by the SARS-CoV-2 virus.  This patient is a 60 y.o. female that meets the FDA criteria for Emergency Use Authorization of tixagevimab/cilgavimab for pre-exposure prophylaxis of COVID-19 disease. Pt meets following criteria:  Age >12 yr and weight > 40kg  Not currently infected with SARS-CoV-2 and has no known recent exposure to an individual infected with SARS-CoV-2 AND o Who has moderate to severe immune compromise due to a medical condition or receipt of immunosuppressive medications or treatments and may not mount an adequate immune response to COVID-19 vaccination or  o Vaccination with any available COVID-19 vaccine, according to the approved or authorized schedule, is not recommended due to a history of severe adverse reaction (e.g., severe allergic reaction) to a COVID-19 vaccine(s) and/or COVID-19 vaccine component(s).  o Patient meets the following definition of mod-severe immune compromised status: 8. Other groups not previously mentioned: 4. AIDS  I have spoken and communicated the following to the patient or parent/caregiver regarding COVID monoclonal antibody treatment:  1. FDA has authorized the emergency use of tixagevimab/cilgavimab for the pre-exposure prophylaxis of COVID-19 in patients with moderate-severe immunocompromised status, who meet above EUA criteria.  2. The significant known and potential risks and benefits of COVID monoclonal antibody, and the extent to which such potential risks and benefits are unknown.  3. Information on available alternative treatments and the risks and benefits of those alternatives, including clinical trials.  4. The patient or parent/caregiver has the option to accept or refuse COVID monoclonal antibody  treatment.  After reviewing this information with the patient, agree to receive tixagevimab/cilgavimab  [name], PA-C, 02/22/2020, 2:16 PM

## 2020-02-29 ENCOUNTER — Telehealth: Payer: Self-pay | Admitting: Adult Health

## 2020-02-29 NOTE — Telephone Encounter (Signed)
Called patient and LMOM for her to return my call as we need to postpone her scheduled Evusheld injection due to updated FDA guidelines regarding dosing.      Lillard Anes

## 2020-03-01 ENCOUNTER — Encounter (HOSPITAL_COMMUNITY): Payer: 59

## 2020-03-07 ENCOUNTER — Ambulatory Visit: Payer: 59 | Admitting: Infectious Diseases

## 2020-03-15 ENCOUNTER — Encounter: Payer: Self-pay | Admitting: Infectious Diseases

## 2020-05-03 DIAGNOSIS — B20 Human immunodeficiency virus [HIV] disease: Secondary | ICD-10-CM

## 2020-05-03 DIAGNOSIS — Z79899 Other long term (current) drug therapy: Secondary | ICD-10-CM

## 2020-05-06 ENCOUNTER — Other Ambulatory Visit: Payer: 59

## 2020-05-06 ENCOUNTER — Other Ambulatory Visit: Payer: Self-pay

## 2020-05-06 DIAGNOSIS — Z79899 Other long term (current) drug therapy: Secondary | ICD-10-CM

## 2020-05-06 DIAGNOSIS — B20 Human immunodeficiency virus [HIV] disease: Secondary | ICD-10-CM

## 2020-05-09 LAB — T-HELPER CELLS (CD4) COUNT (NOT AT ARMC)
Absolute CD4: 57 cells/uL — ABNORMAL LOW (ref 490–1740)
CD4 T Helper %: 11 % — ABNORMAL LOW (ref 30–61)
Total lymphocyte count: 524 cells/uL — ABNORMAL LOW (ref 850–3900)

## 2020-05-09 LAB — LIPID PANEL
Cholesterol: 100 mg/dL (ref <200)
HDL: 40 mg/dL — ABNORMAL LOW (ref >=50)
LDL Cholesterol (Calc): 42 mg/dL (calc)
Non-HDL Cholesterol (Calc): 60 mg/dL (calc) (ref <130)
Total CHOL/HDL Ratio: 2.5 (calc) (ref <5.0)
Triglycerides: 95 mg/dL (ref <150)

## 2020-05-09 LAB — HIV-1 RNA QUANT-NO REFLEX-BLD
HIV 1 RNA Quant: NOT DETECTED Copies/mL
HIV-1 RNA Quant, Log: NOT DETECTED Log cps/mL

## 2020-05-13 ENCOUNTER — Ambulatory Visit
Admission: RE | Admit: 2020-05-13 | Discharge: 2020-05-13 | Disposition: A | Discharge status: Home / Self Care | Payer: 59 | Source: Ambulatory Visit | Attending: Infectious Diseases | Admitting: Infectious Diseases

## 2020-05-13 ENCOUNTER — Ambulatory Visit (INDEPENDENT_AMBULATORY_CARE_PROVIDER_SITE_OTHER): Payer: 59 | Admitting: Infectious Diseases

## 2020-05-13 ENCOUNTER — Other Ambulatory Visit: Payer: Self-pay

## 2020-05-13 VITALS — BP 120/75 | HR 58 | Wt 153.0 lb

## 2020-05-13 DIAGNOSIS — R5383 Other fatigue: Secondary | ICD-10-CM | POA: Diagnosis not present

## 2020-05-13 DIAGNOSIS — R87619 Unspecified abnormal cytological findings in specimens from cervix uteri: Secondary | ICD-10-CM | POA: Diagnosis not present

## 2020-05-13 DIAGNOSIS — M25541 Pain in joints of right hand: Secondary | ICD-10-CM | POA: Diagnosis not present

## 2020-05-13 DIAGNOSIS — M25542 Pain in joints of left hand: Secondary | ICD-10-CM

## 2020-05-13 DIAGNOSIS — B20 Human immunodeficiency virus [HIV] disease: Secondary | ICD-10-CM

## 2020-05-13 DIAGNOSIS — G479 Sleep disorder, unspecified: Secondary | ICD-10-CM

## 2020-05-13 NOTE — Patient Instructions (Signed)
Make a lab appointment next week (maybe Wednesday)  Stop by [facility] on your way out to do a hand xray for both hands  Will have you go ahead and get another appointment in 3 months arranged and we can flex it if we need to.

## 2020-05-13 NOTE — Progress Notes (Signed)
lp     Patient: Stacy Watkins  DOB: 07/06/1960 MRN: [id number] PCP: Carol Ada, MD     Subjective:  Brief Narrative: Stacy Watkins is a 60 y.o. female with HIV, AIDS+ Dx 05/2019 CD4 nadir < 35 HIV Risk: sexual OI: oral thrush Intake Labs: Hep B sAg (-), sAb (-), cAb (-), Hep A Ab (-), Hep C Ab (-) Quantiferon (-) HLA B*5701 (pending)  Previous Regimens:  Biktarvy >> suppressed   Genotype:   06-2019 - wildtype   Chief Complaint  Patient presents with  . Follow-up    Reports joint pain; feels like she's going downhill for the last 3 months; joint pain has worsened in the last 2 weeks; no fevers/chills; low energy/exhaustion;       HPI: Stacy Watkins is here for routine follow-up. She has continued on Biktarvy once a day without any dose interruption. She takes this every day with other prescriptions and then supplements > 6 hours after in the evening. No trouble with side effects.   She is very concerned today. She feels that she has worsened over the last 3 months regarding fatigue she previously associated with chronic EBV spells.  Really felt that she started turning a corner up until the beginning of this year. Sleep has always been challenging for her as well and more so recently - was transitioned off ambien and now taking xanax to help per her PCP. She does not have many localizing features aside from wrist, elbow and arm pain (L>R) that comes and goes. She also reports some temps around 100 deg in the evening during these episodes. She has had these episodes for about 5 years now where she would have a period of a few weeks where she feels horrible. She recalls an episode of shingles in the setting of bronchitis and pneumonia around 4 years ago which really took her out. She was not able to find anything with conventional practitioner so she went for help with functional medicine team - tried several elimination diets for major offenders for about 6 months and they focused  care on chronic EBV. During this time she also underwent mold protocol to help but nothing seemed to work. She did get better on biktarvy but now many of her symptoms have returned. She is very understandably frustrated with search to feel better.   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 Following with general surgery as well for perirectal hpv as well. Some lesions removed, some remain. Concerned about risk for further surgeries given immune system.    Review of Systems  Constitutional: Positive for activity change and fatigue. Negative for appetite change, chills, diaphoresis, fever and unexpected weight change.  HENT: Negative for mouth sores, sore throat and trouble swallowing.   Eyes: Negative for pain and visual disturbance.  Respiratory: Negative for cough and shortness of breath.   Cardiovascular: Negative for chest pain.  Gastrointestinal: Negative for abdominal distention, abdominal pain, blood in stool, diarrhea and nausea.  Genitourinary: Negative for dysuria, menstrual problem and pelvic pain.  Musculoskeletal: Positive for arthralgias and neck stiffness (morning). Negative for back pain and neck pain.       Wrist, elbow, elbow pain that come and go. Left shoulder/arm continues with pain.   Skin: Negative for color change and rash.  Neurological: Positive for weakness (generalized). Negative for dizziness, tremors, speech difficulty, numbness and headaches.  Hematological: Negative for adenopathy.  Psychiatric/Behavioral: Negative for dysphoric mood and sleep  disturbance. The patient is not nervous/anxious.      Past Medical History:  Diagnosis Date  . Anxiety   . Depression   . Depression with anxiety   . HIV disease ([place]) 06/08/2019  . Hyperlipidemia   . Hypertension   . Migraine   . PMB (postmenopausal bleeding)   . Seasonal allergies   . Situational stress   . Wears glasses     Outpatient Medications Prior to  Visit  Medication Sig Dispense Refill  . ALPRAZolam (XANAX) 0.5 MG tablet 2 (two) times daily as needed.    Marland Kitchen amLODipine (NORVASC) 2.5 MG tablet daily.    Marland Kitchen aspirin 81 MG tablet Take 81 mg by mouth daily.    Marland Kitchen atenolol (TENORMIN) 50 MG tablet Take 50 mg by mouth daily.     Marland Kitchen CALCIUM PO Take by mouth every evening.    . Cholecalciferol (VITAMIN D PO) Take by mouth every evening.    Marland Kitchen EMGALITY 120 MG/ML SOAJ Inject into the skin every 30 (thirty) days.    . NON FORMULARY 75 mg every evening. Pregnenolone    . nortriptyline (PAMELOR) 50 MG capsule Take 50 mg by mouth daily.    . Omega-3 Fatty Acids (FISH OIL PO) Take by mouth. (Patient not taking: No sig reported)    . Probiotic Product (ALIGN PO) Take 1 capsule by mouth 2 (two) times daily. PM AND QHS  PROBIOTIC 25 BILLION PER STRAIN    . valACYclovir (VALTREX) 1000 MG tablet Take 1 tablet by mouth daily. (Patient not taking: No sig reported)    . VITAMIN A PO Take by mouth every evening.    Marland Kitchen VITAMIN K PO Take by mouth every evening.    . bictegravir-emtricitabine-tenofovir AF (BIKTARVY) 50-200-25 MG TABS tablet Take 1 tablet by mouth daily. 30 tablet 11  . ivermectin (STROMECTOL) 3 MG TABS tablet Take 5 tablets by mouth once a week.    . rosuvastatin (CRESTOR) 20 MG tablet Take 1 tablet (20 mg total) by mouth daily. (Patient taking differently: Take 10 mg by mouth daily.) 90 tablet 0  . sulfamethoxazole-trimethoprim (BACTRIM) 400-80 MG tablet TAKE 1 TABLET BY MOUTH EVERY DAY 30 tablet 2  . clonazePAM (KLONOPIN) 0.5 MG tablet Take 0.5 mg by mouth every evening.    . cyclobenzaprine (FLEXERIL) 10 MG tablet TAKE ONE TABLET BY MOUTH 3 TIMES A DAY AS NEEDED. (Patient not taking: No sig reported)    . rizatriptan (MAXALT) 10 MG tablet Take 10 mg by mouth as needed for migraine. May repeat in 2 hours if needed    . zolpidem (AMBIEN) 10 MG tablet Take 5 mg by mouth.    . butalbital-acetaminophen-caffeine (FIORICET, ESGIC) 50-325-40 MG tablet Take 1  tablet by mouth every 6 (six) hours as needed for migraine.  (Patient not taking: Reported on 05/13/2020)    . fluconazole (DIFLUCAN) 100 MG tablet Take 1 tablet (100 mg total) by mouth daily. (Patient not taking: Reported on 05/13/2020) 7 tablet 1  . MULTIPLE VITAMIN PO Take 1 tablet by mouth daily.  (Patient not taking: Reported on 05/13/2020)     No facility-administered medications prior to visit.     Allergies  Allergen Reactions  . Dust Mite Extract Other (See Comments)  . Mold Extract  [Trichophyton] Other (See Comments)  . Trazodone Hcl Other (See Comments)    NOT SURE REACTION    Social History   Tobacco Use  . Smoking status: Never Smoker  . Smokeless tobacco: Never Used  Vaping Use  . Vaping Use: Never used  Substance Use Topics  . Alcohol use: No  . Drug use: No     Objective:   Vitals:   05/13/20 1049  BP: 120/75  Pulse: (!) 58  Weight: 153 lb (69.4 kg)   Body mass index is 25.46 kg/m.   Physical Exam Constitutional:      Appearance: Normal appearance. She is not ill-appearing.     Comments: Well-appearing today.   HENT:     Mouth/Throat:     Mouth: Mucous membranes are moist. No oral lesions.     Dentition: No dental abscesses.     Pharynx: Oropharynx is clear.  Eyes:     General: No scleral icterus. Cardiovascular:     Rate and Rhythm: Normal rate and regular rhythm.     Heart sounds: Normal heart sounds.  Pulmonary:     Effort: Pulmonary effort is normal.     Breath sounds: Normal breath sounds.  Abdominal:     General: There is no distension.     Palpations: Abdomen is soft.     Tenderness: There is no abdominal tenderness.  Musculoskeletal:        General: No tenderness. Normal range of motion.  Lymphadenopathy:     Cervical: No cervical adenopathy.  Skin:    General: Skin is warm and dry.     Findings: No rash.  Neurological:     Mental Status: She is alert and oriented to person, place, and time.  Psychiatric:        Mood and  Affect: Mood normal.        Thought Content: Thought content normal.        Judgment: Judgment normal.      Lab Results: Lab Results  Component Value Date   WBC 3.4 (L) 05/18/2020   HGB 14.2 05/18/2020   HCT 41.2 05/18/2020   MCV 92.4 05/18/2020   PLT 170 05/18/2020    Lab Results  Component Value Date   CREATININE 0.88 05/18/2020   BUN 18 05/18/2020   NA 140 05/18/2020   K 4.2 05/18/2020   CL 105 05/18/2020   CO2 28 05/18/2020    Lab Results  Component Value Date   ALT 19 02/04/2020   AST 25 02/04/2020   BILITOT 0.4 02/04/2020     Assessment & Plan:   Problem List Items Addressed This Visit      Unprioritized   Fatigue    Stacy Watkins has had episodes similar to her current one for several years now dating back 5 years ago. Not many localizing features aside from some subjective fevers/temperatures around 100 and joint pain.  I feel that this episode is longer induration than previous episodes.  Upon review of medications - ivermectin has been ongoing weekly for a few months now; can be A/W joint pain. Will advise to stop.  I reviewed EBV studies from functional medicine team and all appear to be c/w previous infection and nothing active. Check IG's for consideration of possible IVIG given immunocompromised state.       Relevant Orders   Cortisol (Completed)   Rheumatoid Factor (Completed)   Lactate dehydrogenase   Sedimentation rate (Completed)   ANA, IFA Comprehensive Panel-([facility]) (Completed)   CBC with Differential/Platelet (Completed)   IgG, IgA, IgM (Completed)   Difficulty sleeping    Working with PCP with this. She has transitioned off ambien to xanax.       AIDS (acquired immune deficiency syndrome) ([place])  Viral load remains undetectable on Biktarvy once daily, indicating good control over HIV. She has not rebounded much with regards to immune reconstitution at nearly 1 year into treatment and now with more fatigue and overall feeling poorly. We have  previously discussed adding Fostemsavir BID for unique CD4 reconstitution (though not sure insurance will approve this given it's mainly for drug resistant HIV cases, which is not present here).  Will continue bactrim 1 SS daily for prophylaxis.  Will check immunoglobulins as she was curious re: benefit of IVIG.  Will refer to hematology for second opinion of secondary causes to slow reconstitution, but low counts likely all related to advanced HIV.  Return in about 3 months (around 08/13/2020).       Abnormal Pap smear of cervix    Undergoing surgery excision next week for this - VL is undetectable. CD4 not ideal however given viral suppression this greatly reduces risks associated with procedure.        Other Visit Diagnoses    Arthralgia of both hands    -  Primary   Relevant Orders   DG Hand 2 View Right (Completed)   DG Hand 2 View Left (Completed)   Lactate dehydrogenase   Sedimentation rate (Completed)   ANA, IFA Comprehensive Panel-([facility]) (Completed)     Return in about 3 months (around 08/13/2020).   Janene Madeira, MSN, NP-C [facility] [place] Pager: [phone] Office: [phone]

## 2020-05-15 ENCOUNTER — Other Ambulatory Visit: Payer: Self-pay | Admitting: Infectious Diseases

## 2020-05-15 DIAGNOSIS — B20 Human immunodeficiency virus [HIV] disease: Secondary | ICD-10-CM

## 2020-05-17 ENCOUNTER — Other Ambulatory Visit: Payer: Self-pay

## 2020-05-17 ENCOUNTER — Encounter (HOSPITAL_BASED_OUTPATIENT_CLINIC_OR_DEPARTMENT_OTHER): Payer: Self-pay | Admitting: Obstetrics and Gynecology

## 2020-05-17 NOTE — Progress Notes (Addendum)
Spoke w/ via phone for pre-op interview---PT Lab needs dos----  NONE HAS LAB APPT 05-18-2020 1145 FOR CBC BMP T & S             Lab results------EKG [place] 03-10-2020 ON CHART COVID test -----patient states asymptomatic no test needed Arrive at -------530 AM 05-20-2020 NPO after MN NO Solid Food.  Clear liquids from MN until---430 AM THEN NPO Med rec completed Medications to take morning of surgery -----BIKTARVY, ROSUVASTATIN, ALPRAZOLAMN PRN, AMLODIPINE, ATENOLOL, NORTRIPTYLINE Diabetic medication -----N/A Patient instructed to bring photo id and insurance card day of surgery Patient aware to have Driver (ride ) / caregiver  PATIENT AWARE DRIVER/CAREGIVER NEEDED AND WILL ARRANGE   for 24 hours after surgery  Patient Special Instructions ----- NONE Pre-Op special Istructions -----NONE Patient verbalized understanding of instructions that were given at this phone interview. Patient denies shortness of breath, chest pain, fever, cough at this phone interview.

## 2020-05-18 ENCOUNTER — Other Ambulatory Visit: Payer: 59

## 2020-05-18 ENCOUNTER — Encounter (HOSPITAL_COMMUNITY)
Admission: RE | Admit: 2020-05-18 | Discharge: 2020-05-18 | Disposition: A | Discharge status: Home / Self Care | Payer: 59 | Source: Ambulatory Visit | Attending: Obstetrics and Gynecology | Admitting: Obstetrics and Gynecology

## 2020-05-18 DIAGNOSIS — M25541 Pain in joints of right hand: Secondary | ICD-10-CM

## 2020-05-18 DIAGNOSIS — R5383 Other fatigue: Secondary | ICD-10-CM

## 2020-05-18 DIAGNOSIS — Z01812 Encounter for preprocedural laboratory examination: Secondary | ICD-10-CM | POA: Diagnosis not present

## 2020-05-18 DIAGNOSIS — M25542 Pain in joints of left hand: Secondary | ICD-10-CM

## 2020-05-18 LAB — CBC
HCT: 41.2 % (ref 36.0–46.0)
Hemoglobin: 14.2 g/dL (ref 12.0–15.0)
MCH: 31.8 pg (ref 26.0–34.0)
MCHC: 34.5 g/dL (ref 30.0–36.0)
MCV: 92.4 fL (ref 80.0–100.0)
Platelets: 170 10*3/uL (ref 150–400)
RBC: 4.46 MIL/uL (ref 3.87–5.11)
RDW: 11.5 % (ref 11.5–15.5)
WBC: 3.4 10*3/uL — ABNORMAL LOW (ref 4.0–10.5)
nRBC: 0 % (ref 0.0–0.2)

## 2020-05-18 LAB — BASIC METABOLIC PANEL
Anion gap: 7 (ref 5–15)
BUN: 18 mg/dL (ref 6–20)
CO2: 28 mmol/L (ref 22–32)
Calcium: 8.9 mg/dL (ref 8.9–10.3)
Chloride: 105 mmol/L (ref 98–111)
Creatinine, Ser: 0.88 mg/dL (ref 0.44–1.00)
GFR, Estimated: >60 mL/min (ref >60)
Glucose, Bld: 119 mg/dL — ABNORMAL HIGH (ref 70–99)
Potassium: 4.2 mmol/L (ref 3.5–5.1)
Sodium: 140 mmol/L (ref 135–145)

## 2020-05-19 ENCOUNTER — Other Ambulatory Visit: Payer: Self-pay | Admitting: Cardiology

## 2020-05-19 DIAGNOSIS — E7800 Pure hypercholesterolemia, unspecified: Secondary | ICD-10-CM

## 2020-05-19 DIAGNOSIS — D72810 Lymphocytopenia: Secondary | ICD-10-CM

## 2020-05-19 NOTE — H&P (Signed)
Stacy Watkins is an 60 y.o. female. She has PMB. U/S in office 1/26/22oted 7 & 5 mm EM polypoid structures.   Pertinent Gynecological History: Menses: post-menopausal Bleeding: Contraception: none DES exposure: denies Blood transfusions:  Sexually transmitted diseases: AIDS Previous GYN Procedures:  Last mammogram: normal Date: 4/22 Last pap: abnormal: CIN I Date: 1/22  >colpo CIN I  OB History: G3, P2   Menstrual History: Menarche age: unknown Patient's last menstrual period was 09/24/2012.    Past Medical History:  Diagnosis Date  . Anxiety   . Depression   . Depression with anxiety   . HIV disease ([facility]) 06/08/2019  . Hyperlipidemia   . Hypertension   . Migraine   . PMB (postmenopausal bleeding)   . Seasonal allergies   . Situational stress   . Wears glasses     Past Surgical History:  Procedure Laterality Date  . CARPAL TUNNEL RELEASE Right 2016  . CARPAL TUNNEL RELEASE Left 10/11/2014   Procedure: LEFT  ENDOSCOPIC CARPAL TUNNEL RELEASE;  Surgeon: Mack Hook, MD;  Location: [facility];  Service: Orthopedics;  Laterality: Left;  . CESAREAN SECTION    . CHOLECYSTECTOMY  2017   LAPAROSCOPIC  . COLONSCOPY  LAST DONE 2019  . DILATION AND CURETTAGE OF UTERUS     X 1  . EYE SURGERY  AGE 59  . TRIGGER FINGER RELEASE Right 12/09/2017   Procedure: RELEASE TRIGGER FINGER/A-1 PULLEY;  Surgeon: Mack Hook, MD;  Location: [facility];  Service: Orthopedics;  Laterality: Right;    Family History  Problem Relation Age of Onset  . Heart failure Mother   . Colon cancer Father   . CAD Father        MI in his 81s  . Heart attack Father   . CAD Sister        Died at age 98 of MI  . Heart attack Sister   . Hypertension Brother     Social History:  reports that she has never smoked. She has never used smokeless tobacco. She reports that she does not drink alcohol and does not use drugs.  Allergies:  Allergies  Allergen Reactions  .  Dust Mite Extract Other (See Comments)  . Mold Extract  [Trichophyton] Other (See Comments)  . Trazodone Hcl Other (See Comments)    NOT SURE REACTION    No medications prior to admission.    Review of Systems  Constitutional: Positive for fatigue. Negative for fever.    Height 5\' 5"  (1.651 m), weight 69.4 kg, last menstrual period 09/24/2012. Physical Exam Cardiovascular:     Rate and Rhythm: Normal rate.  Pulmonary:     Effort: Pulmonary effort is normal.     No results found for this or any previous visit (from the past 24 hour(s)).  No results found.  Assessment/Plan: 60 yo G3P2 PMB D/W H/S, D&C, possible Myosure. Risks reviewed including infection, uterine perforation and organ damage, bleeding/transfusion-HIV/Hep, DVT/PE, pneumonia. She consulted ID for pre op clearance.  67 II 05/19/2020, 4:53 PM

## 2020-05-20 ENCOUNTER — Encounter (HOSPITAL_BASED_OUTPATIENT_CLINIC_OR_DEPARTMENT_OTHER)
Admission: RE | Disposition: A | Discharge status: Home / Self Care | Payer: Self-pay | Source: Home / Self Care | Attending: Obstetrics and Gynecology

## 2020-05-20 ENCOUNTER — Ambulatory Visit (HOSPITAL_BASED_OUTPATIENT_CLINIC_OR_DEPARTMENT_OTHER)
Admission: RE | Admit: 2020-05-20 | Discharge: 2020-05-20 | Disposition: A | Discharge status: Home / Self Care | Payer: 59 | Attending: Obstetrics and Gynecology | Admitting: Obstetrics and Gynecology

## 2020-05-20 ENCOUNTER — Ambulatory Visit (HOSPITAL_BASED_OUTPATIENT_CLINIC_OR_DEPARTMENT_OTHER): Payer: 59 | Admitting: Anesthesiology

## 2020-05-20 ENCOUNTER — Encounter (HOSPITAL_BASED_OUTPATIENT_CLINIC_OR_DEPARTMENT_OTHER): Payer: Self-pay | Admitting: Obstetrics and Gynecology

## 2020-05-20 DIAGNOSIS — I10 Essential (primary) hypertension: Secondary | ICD-10-CM | POA: Diagnosis not present

## 2020-05-20 DIAGNOSIS — Z800 Family history of malignant neoplasm of digestive organs: Secondary | ICD-10-CM | POA: Insufficient documentation

## 2020-05-20 DIAGNOSIS — Z8249 Family history of ischemic heart disease and other diseases of the circulatory system: Secondary | ICD-10-CM | POA: Insufficient documentation

## 2020-05-20 DIAGNOSIS — N950 Postmenopausal bleeding: Secondary | ICD-10-CM | POA: Diagnosis present

## 2020-05-20 DIAGNOSIS — R5383 Other fatigue: Secondary | ICD-10-CM | POA: Insufficient documentation

## 2020-05-20 HISTORY — DX: Essential (primary) hypertension: I10

## 2020-05-20 HISTORY — DX: Hyperlipidemia, unspecified: E78.5

## 2020-05-20 HISTORY — DX: Migraine, unspecified, not intractable, without status migrainosus: G43.909

## 2020-05-20 HISTORY — DX: Postmenopausal bleeding: N95.0

## 2020-05-20 HISTORY — PX: DILATATION & CURETTAGE/HYSTEROSCOPY WITH MYOSURE: SHX6511

## 2020-05-20 HISTORY — DX: Presence of spectacles and contact lenses: Z97.3

## 2020-05-20 LAB — CBC WITH DIFFERENTIAL/PLATELET
Absolute Monocytes: 168 cells/uL — ABNORMAL LOW (ref 200–950)
Basophils Absolute: 31 cells/uL (ref 0–200)
Basophils Relative: 1.1 %
Eosinophils Absolute: 120 cells/uL (ref 15–500)
Eosinophils Relative: 4.3 %
HCT: 41.8 % (ref 35.0–45.0)
Hemoglobin: 14.5 g/dL (ref 11.7–15.5)
Lymphs Abs: 736 cells/uL — ABNORMAL LOW (ref 850–3900)
MCH: 32.4 pg (ref 27.0–33.0)
MCHC: 34.7 g/dL (ref 32.0–36.0)
MCV: 93.3 fL (ref 80.0–100.0)
MPV: 9.8 fL (ref 7.5–12.5)
Monocytes Relative: 6 %
Neutro Abs: 1744 cells/uL (ref 1500–7800)
Neutrophils Relative %: 62.3 %
Platelets: 160 10*3/uL (ref 140–400)
RBC: 4.48 10*6/uL (ref 3.80–5.10)
RDW: 11.8 % (ref 11.0–15.0)
Total Lymphocyte: 26.3 %
WBC: 2.8 10*3/uL — ABNORMAL LOW (ref 3.8–10.8)

## 2020-05-20 LAB — ANA, IFA COMPREHENSIVE PANEL
Anti Nuclear Antibody (ANA): NEGATIVE
ENA SM Ab Ser-aCnc: <1 AI
SM/RNP: <1 AI
SSA (Ro) (ENA) Antibody, IgG: <1 AI
SSB (La) (ENA) Antibody, IgG: <1 AI
Scleroderma (Scl-70) (ENA) Antibody, IgG: <1 AI
ds DNA Ab: <1 IU/mL

## 2020-05-20 LAB — TYPE AND SCREEN
ABO/RH(D): O POS
Antibody Screen: NEGATIVE

## 2020-05-20 LAB — SEDIMENTATION RATE: Sed Rate: 2 mm/h (ref 0–30)

## 2020-05-20 LAB — LACTATE DEHYDROGENASE: LDH: 146 U/L (ref 120–250)

## 2020-05-20 LAB — ABO/RH: ABO/RH(D): O POS

## 2020-05-20 LAB — IGG, IGA, IGM
IgG (Immunoglobin G), Serum: 955 mg/dL (ref 600–1640)
IgM, Serum: 54 mg/dL (ref 50–300)
Immunoglobulin A: 207 mg/dL (ref 47–310)

## 2020-05-20 LAB — RHEUMATOID FACTOR: Rheumatoid fact SerPl-aCnc: <14 IU/mL (ref <14)

## 2020-05-20 LAB — CORTISOL: Cortisol, Plasma: 8.9 ug/dL

## 2020-05-20 SURGERY — DILATATION & CURETTAGE/HYSTEROSCOPY WITH MYOSURE
Anesthesia: General | Site: Vagina

## 2020-05-20 MED ORDER — KETOROLAC TROMETHAMINE 30 MG/ML IJ SOLN
INTRAMUSCULAR | Status: DC | PRN
  Administered 2020-05-20: 30 mg via INTRAVENOUS

## 2020-05-20 MED ORDER — FENTANYL CITRATE (PF) 100 MCG/2ML IJ SOLN
INTRAMUSCULAR | Status: AC
  Filled 2020-05-20: qty 2

## 2020-05-20 MED ORDER — LIDOCAINE 2% (20 MG/ML) 5 ML SYRINGE
INTRAMUSCULAR | Status: AC
  Filled 2020-05-20: qty 5

## 2020-05-20 MED ORDER — ONDANSETRON HCL 4 MG/2ML IJ SOLN
INTRAMUSCULAR | Status: DC | PRN
  Administered 2020-05-20: 4 mg via INTRAVENOUS

## 2020-05-20 MED ORDER — SODIUM CHLORIDE 0.9 % IR SOLN
Status: DC | PRN
  Administered 2020-05-20: 3000 mL

## 2020-05-20 MED ORDER — LACTATED RINGERS IV SOLN: INTRAVENOUS | Status: DC

## 2020-05-20 MED ORDER — ACETAMINOPHEN 500 MG PO TABS
ORAL_TABLET | ORAL | Status: AC
  Filled 2020-05-20: qty 2

## 2020-05-20 MED ORDER — LIDOCAINE HCL (CARDIAC) PF 100 MG/5ML IV SOSY
PREFILLED_SYRINGE | INTRAVENOUS | Status: DC | PRN
  Administered 2020-05-20: 80 mg via INTRAVENOUS

## 2020-05-20 MED ORDER — MIDAZOLAM HCL 5 MG/5ML IJ SOLN
INTRAMUSCULAR | Status: DC | PRN
  Administered 2020-05-20: 2 mg via INTRAVENOUS

## 2020-05-20 MED ORDER — SOD CITRATE-CITRIC ACID 500-334 MG/5ML PO SOLN: 30.0000 mL | ORAL | Status: DC

## 2020-05-20 MED ORDER — POVIDONE-IODINE 10 % EX SWAB: 2.0000 "application " | Freq: Once | CUTANEOUS | Status: DC

## 2020-05-20 MED ORDER — CEFAZOLIN SODIUM-DEXTROSE 2-4 GM/100ML-% IV SOLN
2.0000 g | INTRAVENOUS | Status: AC
  Administered 2020-05-20: 2 g via INTRAVENOUS

## 2020-05-20 MED ORDER — MIDAZOLAM HCL 2 MG/2ML IJ SOLN
INTRAMUSCULAR | Status: AC
  Filled 2020-05-20: qty 2

## 2020-05-20 MED ORDER — DEXAMETHASONE SODIUM PHOSPHATE 4 MG/ML IJ SOLN
INTRAMUSCULAR | Status: DC | PRN
  Administered 2020-05-20: 5 mg via INTRAVENOUS

## 2020-05-20 MED ORDER — PROPOFOL 10 MG/ML IV BOLUS
INTRAVENOUS | Status: AC
  Filled 2020-05-20: qty 20

## 2020-05-20 MED ORDER — DEXAMETHASONE SODIUM PHOSPHATE 10 MG/ML IJ SOLN
INTRAMUSCULAR | Status: AC
  Filled 2020-05-20: qty 1

## 2020-05-20 MED ORDER — FENTANYL CITRATE (PF) 100 MCG/2ML IJ SOLN
INTRAMUSCULAR | Status: DC | PRN
  Administered 2020-05-20: 50 ug via INTRAVENOUS

## 2020-05-20 MED ORDER — ONDANSETRON HCL 4 MG/2ML IJ SOLN
INTRAMUSCULAR | Status: AC
  Filled 2020-05-20: qty 2

## 2020-05-20 MED ORDER — CEFAZOLIN SODIUM-DEXTROSE 2-4 GM/100ML-% IV SOLN
INTRAVENOUS | Status: AC
  Filled 2020-05-20: qty 100

## 2020-05-20 MED ORDER — PROPOFOL 10 MG/ML IV BOLUS
INTRAVENOUS | Status: DC | PRN
  Administered 2020-05-20: 150 mg via INTRAVENOUS

## 2020-05-20 MED ORDER — ACETAMINOPHEN 500 MG PO TABS
1000.0000 mg | ORAL_TABLET | Freq: Once | ORAL | Status: AC
  Administered 2020-05-20: 1000 mg via ORAL

## 2020-05-20 MED ORDER — LIDOCAINE HCL 1 % IJ SOLN
INTRAMUSCULAR | Status: DC | PRN
  Administered 2020-05-20: 20 mL

## 2020-05-20 MED ORDER — KETOROLAC TROMETHAMINE 30 MG/ML IJ SOLN
INTRAMUSCULAR | Status: AC
  Filled 2020-05-20: qty 1

## 2020-05-20 SURGICAL SUPPLY — 17 items
CATH ROBINSON RED A/P 16FR (CATHETERS) ×3 IMPLANT
DEVICE MYOSURE LITE (MISCELLANEOUS) IMPLANT
DEVICE MYOSURE REACH (MISCELLANEOUS) IMPLANT
DILATOR CANAL MILEX (MISCELLANEOUS) IMPLANT
ELECT REM PT RETURN 9FT ADLT (ELECTROSURGICAL)
ELECTRODE REM PT RTRN 9FT ADLT (ELECTROSURGICAL) IMPLANT
GLOVE SURG ENC MOIS LTX SZ8 (GLOVE) ×3 IMPLANT
GOWN STRL REUS W/TWL LRG LVL3 (GOWN DISPOSABLE) ×3 IMPLANT
IV NS IRRIG 3000ML ARTHROMATIC (IV SOLUTION) ×6 IMPLANT
KIT PROCEDURE FLUENT (KITS) ×3 IMPLANT
KIT TURNOVER CYSTO (KITS) ×3 IMPLANT
PACK VAGINAL MINOR WOMEN LF (CUSTOM PROCEDURE TRAY) ×3 IMPLANT
PAD OB MATERNITY 4.3X12.25 (PERSONAL CARE ITEMS) ×3 IMPLANT
PAD PREP 24X48 CUFFED NSTRL (MISCELLANEOUS) ×3 IMPLANT
SEAL CERVICAL OMNI LOK (ABLATOR) IMPLANT
SEAL ROD LENS SCOPE MYOSURE (ABLATOR) ×3 IMPLANT
TOWEL OR 17X26 10 PK STRL BLUE (TOWEL DISPOSABLE) ×3 IMPLANT

## 2020-05-20 NOTE — Progress Notes (Signed)
No changes to H&P per patient history Reviewed procedure-H/S, D&P, possible Myosure PO instructions reviewed She states she understands and agrees

## 2020-05-20 NOTE — Progress Notes (Signed)
05/20/2020  7:39 AM  PATIENT:  Stacy Watkins  60 y.o. female  PRE-OPERATIVE DIAGNOSIS:  PMB  POST-OPERATIVE DIAGNOSIS:  PMB  PROCEDURE:  Hysteroscopy with dilation and curretage  SURGEON:  Surgeon(s) and Role:    Harold Hedge, MD - Primary  PHYSICIAN ASSISTANT:   ASSISTANTS: none   ANESTHESIA:   general  EBL:  Per anesthesiology note   BLOOD ADMINISTERED:none  DRAINS: none   LOCAL MEDICATIONS USED:  LIDOCAINE  and Amount: 20 ml  SPECIMEN:  Source of Specimen:  endometrial currettings  DISPOSITION OF SPECIMEN:  PATHOLOGY  COUNTS:  YES  TOURNIQUET:  * No tourniquets in log *  DICTATION: .Other Dictation: Dictation Number [id number]  PLAN OF CARE: Discharge to home after PACU  PATIENT DISPOSITION:  PACU - hemodynamically stable.   Delay start of Pharmacological VTE agent (>24hrs) due to surgical blood loss or risk of bleeding: not applicable

## 2020-05-20 NOTE — Assessment & Plan Note (Addendum)
Stacy Watkins has had episodes similar to her current one for several years now dating back 5 years ago. Not many localizing features aside from some subjective fevers/temperatures around 100 and joint pain.  I feel that this episode is longer induration than previous episodes.  Upon review of medications - ivermectin has been ongoing weekly for a few months now; can be A/W joint pain. Will advise to stop.  I reviewed EBV studies from functional medicine team and all appear to be c/w previous infection and nothing active. Check IG's for consideration of possible IVIG given immunocompromised state.

## 2020-05-20 NOTE — Anesthesia Procedure Notes (Signed)
Procedure Name: LMA Insertion Date/Time: 05/20/2020 7:23 AM Performed by: Cleda Clarks, CRNA Pre-anesthesia Checklist: Patient identified, Emergency Drugs available, Suction available and Patient being monitored Patient Re-evaluated:Patient Re-evaluated prior to induction Oxygen Delivery Method: Circle system utilized Preoxygenation: Pre-oxygenation with 100% oxygen Induction Type: IV induction Ventilation: Mask ventilation without difficulty LMA: LMA inserted LMA Size: 3.0 Number of attempts: 1 Placement Confirmation: positive ETCO2 Tube secured with: Tape Dental Injury: Teeth and Oropharynx as per pre-operative assessment

## 2020-05-20 NOTE — Assessment & Plan Note (Signed)
Working with PCP with this. She has transitioned off ambien to xanax.

## 2020-05-20 NOTE — Op Note (Signed)
NAME: Stacy Watkins, Stacy Watkins MEDICAL RECORD NO: [id number] ACCOUNT NO: [id number] DATE OF BIRTH: 1960/05/11 FACILITY: [facility] LOCATION: [facility] PHYSICIAN: Guy Sandifer. Arleta Creek, MD  Operative Report   DATE OF PROCEDURE: 05/20/2020  PREOPERATIVE DIAGNOSIS:  Postmenopausal bleeding.  POSTOPERATIVE DIAGNOSIS:  Postmenopausal bleeding.  PROCEDURE:  Hysteroscopy, dilation and curettage.  SURGEON:  Harold Hedge II, MD  ANESTHESIA:  General with LMA.  ESTIMATED BLOOD LOSS:  Drops 5 ml   Distending media 30 mL deficit.  SPECIMENS:  Endometrial curettings to pathology.  INDICATIONS AND CONSENT:  This patient is a 60 year old patient with postmenopausal bleeding.  Details dictated in the history and physical.  Hysteroscopy, D and C, possible MyoSure has been discussed.  Potential risks and complications reviewed  preoperatively including but not limited to infection, uterine perforation, organ damage, bleeding requiring transfusion of blood products with HIV and hepatitis acquisition, DVT, PE, pneumonia, and recurrent postmenopausal bleeding.  The patient states  she understands and agrees and consent is signed on the chart.  FINDINGS:  Both fallopian tube ostia are noted.  The cavity is without abnormal structure.  DESCRIPTION OF PROCEDURE:  The patient is taken to the operating room where she is identified, placed in the dorsal supine position and general anesthesia is induced via LMA.  She is placed in the dorsal lithotomy position.  She is prepped vaginally with  Betadine.  She voided immediately prior to transfer to the operating room.  She is draped in a sterile fashion.  Timeout is undertaken.  Bivalve speculum is placed in the vagina and the anterior cervical lip is infiltrated with 1% lidocaine and grasped  with a single tooth tenaculum.  Paracervical block is placed at the 2, 4, 5, 7, 8 and 10 o'clock positions with approximately 20 mL of the same solution.  Cervix is gently progressively  dilated.  Hysteroscope is placed in the endocervical canal and  advanced under direct visualization.  The above findings are noted.  Hysteroscope is withdrawn.  Sharp curettage is carried out for scant tissue.  Reinspection with the hysteroscope reveals the cavity to be clean and intact.  Instruments are removed.   Good hemostasis is noted.  All counts correct.  The patient is awakened and taken to the recovery room in stable condition.   SHW D: 05/20/2020 7:44:13 am T: 05/20/2020 9:31:00 am  JOB: [id number]/ [id number]

## 2020-05-20 NOTE — Transfer of Care (Signed)
Immediate Anesthesia Transfer of Care Note  Patient: Stacy Watkins  Procedure(s) Performed: DILATATION & CURETTAGE/HYSTEROSCOPY (N/A Vagina )  Patient Location: PACU  Anesthesia Type:General  Level of Consciousness: awake, alert  and oriented  Airway & Oxygen Therapy: Patient Spontanous Breathing and Patient connected to nasal cannula oxygen  Post-op Assessment: Report given to RN and Post -op Vital signs reviewed and stable  Post vital signs: Reviewed and stable  Last Vitals:  Vitals Value Taken Time  BP 118/78 05/20/20 0747  Temp    Pulse 87 05/20/20 0749  Resp 14 05/20/20 0749  SpO2 99 % 05/20/20 0749  Vitals shown include unvalidated device data.  Last Pain:  Vitals:   05/20/20 0557  TempSrc: Oral  PainSc: 0-No pain      Patients Stated Pain Goal: 7 (05/20/20 0557)  Complications: No complications documented.

## 2020-05-20 NOTE — Assessment & Plan Note (Signed)
Undergoing surgery excision next week for this - VL is undetectable. CD4 not ideal however given viral suppression this greatly reduces risks associated with procedure.

## 2020-05-20 NOTE — Anesthesia Preprocedure Evaluation (Addendum)
Anesthesia Evaluation  Patient identified by MRN, date of birth, ID band Patient awake    Reviewed: Allergy & Precautions, NPO status , Patient's Chart, lab work & pertinent test results, reviewed documented beta blocker date and time   Airway Mallampati: II  TM Distance: >3 FB Neck ROM: Full    Dental  (+) Dental Advisory Given, Teeth Intact   Pulmonary neg pulmonary ROS,    Pulmonary exam normal breath sounds clear to auscultation       Cardiovascular hypertension, Pt. on home beta blockers and Pt. on medications Normal cardiovascular exam Rhythm:Regular Rate:Normal     Neuro/Psych  Headaches, PSYCHIATRIC DISORDERS Anxiety Depression    GI/Hepatic negative GI ROS, Neg liver ROS,   Endo/Other  negative endocrine ROS  Renal/GU negative Renal ROS     Musculoskeletal negative musculoskeletal ROS (+)   Abdominal   Peds  Hematology  (+) HIV,   Anesthesia Other Findings Day of surgery medications reviewed with the patient.  Reproductive/Obstetrics PMB                            Anesthesia Physical Anesthesia Plan  ASA: III  Anesthesia Plan: General   Post-op Pain Management:    Induction: Intravenous  PONV Risk Score and Plan: 4 or greater and Midazolam, Dexamethasone and Ondansetron  Airway Management Planned: LMA  Additional Equipment:   Intra-op Plan:   Post-operative Plan: Extubation in OR  Informed Consent: I have reviewed the patients History and Physical, chart, labs and discussed the procedure including the risks, benefits and alternatives for the proposed anesthesia with the patient or authorized representative who has indicated his/her understanding and acceptance.     Dental advisory given  Plan Discussed with: CRNA  Anesthesia Plan Comments:         Anesthesia Quick Evaluation

## 2020-05-20 NOTE — Anesthesia Postprocedure Evaluation (Signed)
Anesthesia Post Note  Patient: Stacy Watkins  Procedure(s) Performed: DILATATION & CURETTAGE/HYSTEROSCOPY (N/A Vagina )     Patient location during evaluation: PACU Anesthesia Type: General Level of consciousness: awake and alert Pain management: pain level controlled Vital Signs Assessment: post-procedure vital signs reviewed and stable Respiratory status: spontaneous breathing, nonlabored ventilation, respiratory function stable and patient connected to nasal cannula oxygen Cardiovascular status: blood pressure returned to baseline and stable Postop Assessment: no apparent nausea or vomiting Anesthetic complications: no   No complications documented.  Last Vitals:  Vitals:   05/20/20 0557 05/20/20 0845  BP: 124/78 120/64  Pulse: 78 76  Resp: 15 16  Temp: 37 C   SpO2: 100% 100%    Last Pain:  Vitals:   05/20/20 0845  TempSrc:   PainSc: 0-No pain                 Cecile Hearing

## 2020-05-20 NOTE — Assessment & Plan Note (Addendum)
Viral load remains undetectable on Biktarvy once daily, indicating good control over HIV. She has not rebounded much with regards to immune reconstitution at nearly 1 year into treatment and now with more fatigue and overall feeling poorly. We have previously discussed adding Fostemsavir BID for unique CD4 reconstitution (though not sure insurance will approve this given it's mainly for drug resistant HIV cases, which is not present here).  Will continue bactrim 1 SS daily for prophylaxis.  Will check immunoglobulins as she was curious re: benefit of IVIG.  Will refer to hematology for second opinion of secondary causes to slow reconstitution, but low counts likely all related to advanced HIV.  Return in about 3 months (around 08/13/2020).

## 2020-05-20 NOTE — Discharge Instructions (Signed)
  Post Anesthesia Home Care Instructions  Activity: Get plenty of rest for the remainder of the day. A responsible adult should stay with you for 24 hours following the procedure.  For the next 24 hours, DO NOT: -Drive a car -Advertising copywriter -Drink alcoholic beverages -Take any medication unless instructed by your physician -Make any legal decisions or sign important papers.  Meals: Start with liquid foods such as gelatin or soup. Progress to regular foods as tolerated. Avoid greasy, spicy, heavy foods. If nausea and/or vomiting occur, drink only clear liquids until the nausea and/or vomiting subsides. Call your physician if vomiting continues.  Special Instructions/Symptoms: Your throat may feel dry or sore from the anesthesia or the breathing tube placed in your throat during surgery. If this causes discomfort, gargle with warm salt water. The discomfort should disappear within 24 hours.      DISCHARGE INSTRUCTIONS: D&C / D&E The following instructions have been prepared to help you care for yourself upon your return home.   Personal hygiene: Marland Kitchen Use sanitary pads for vaginal drainage, not tampons. . Shower the day after your procedure. . NO tub baths, pools or Jacuzzis for 2-3 weeks. . Wipe front to back after using the bathroom.  Activity and limitations: . Do NOT drive or operate any equipment for 24 hours. The effects of anesthesia are still present and drowsiness may result. . Do NOT rest in bed all day. . Walking is encouraged. . Walk up and down stairs slowly. . You may resume your normal activity in one to two days or as indicated by your physician.  Sexual activity: NO intercourse for at least 2 weeks after the procedure, or as indicated by your physician.  Diet: Eat a light meal as desired this evening. You may resume your usual diet tomorrow.  Return to work: You may resume your work activities in one to two days or as indicated by your doctor.  What to expect  after your surgery: Expect to have vaginal bleeding/discharge for 2-3 days and spotting for up to 10 days. It is not unusual to have soreness for up to 1-2 weeks. You may have a slight burning sensation when you urinate for the first day. Mild cramps may continue for a couple of days. You may have a regular period in 2-6 weeks.  Call your doctor for any of the following: . Excessive vaginal bleeding, saturating and changing one pad every hour. . Inability to urinate 6 hours after discharge from hospital. . Pain not relieved by pain medication. . Fever of 100.4 F or greater. . Unusual vaginal discharge or odor.   Call for an appointment:    Tylenol given at [phone]. May take next dose at 2 PM. Toradol (NSAID) given at 0740. May take next dose at 2 PM.

## 2020-05-23 ENCOUNTER — Encounter (HOSPITAL_BASED_OUTPATIENT_CLINIC_OR_DEPARTMENT_OTHER): Payer: Self-pay | Admitting: Obstetrics and Gynecology

## 2020-05-23 LAB — SURGICAL PATHOLOGY

## 2020-05-24 ENCOUNTER — Telehealth: Payer: Self-pay | Admitting: Hematology and Oncology

## 2020-05-24 ENCOUNTER — Telehealth: Payer: Self-pay | Admitting: Hematology

## 2020-05-24 ENCOUNTER — Ambulatory Visit: Payer: 59

## 2020-05-24 NOTE — Telephone Encounter (Signed)
Received a new hem referral from Rexene Alberts, NP for lymphopenia. Ms. Crabtree has been cld and scheduled to see Dr. Mosetta Putt on 5/25 at 3:10pm. Pt aware to arrive at 2:45pm to be checked in on time.

## 2020-05-24 NOTE — Telephone Encounter (Signed)
Created in error

## 2020-05-25 ENCOUNTER — Inpatient Hospital Stay: Payer: 59 | Attending: Hematology | Admitting: Hematology

## 2020-05-25 ENCOUNTER — Inpatient Hospital Stay: Payer: 59

## 2020-05-25 ENCOUNTER — Encounter: Payer: Self-pay | Admitting: Hematology

## 2020-05-25 ENCOUNTER — Other Ambulatory Visit: Payer: Self-pay

## 2020-05-25 VITALS — BP 131/72 | HR 83 | Temp 97.6°F | Resp 18 | Ht 65.0 in | Wt 154.2 lb

## 2020-05-25 DIAGNOSIS — B20 Human immunodeficiency virus [HIV] disease: Secondary | ICD-10-CM | POA: Diagnosis not present

## 2020-05-25 DIAGNOSIS — M2550 Pain in unspecified joint: Secondary | ICD-10-CM | POA: Diagnosis not present

## 2020-05-25 DIAGNOSIS — R5383 Other fatigue: Secondary | ICD-10-CM | POA: Insufficient documentation

## 2020-05-25 DIAGNOSIS — M79642 Pain in left hand: Secondary | ICD-10-CM | POA: Insufficient documentation

## 2020-05-25 DIAGNOSIS — I10 Essential (primary) hypertension: Secondary | ICD-10-CM | POA: Diagnosis not present

## 2020-05-25 DIAGNOSIS — Z9049 Acquired absence of other specified parts of digestive tract: Secondary | ICD-10-CM | POA: Insufficient documentation

## 2020-05-25 DIAGNOSIS — M7989 Other specified soft tissue disorders: Secondary | ICD-10-CM | POA: Insufficient documentation

## 2020-05-25 DIAGNOSIS — M79641 Pain in right hand: Secondary | ICD-10-CM | POA: Insufficient documentation

## 2020-05-25 DIAGNOSIS — F32A Depression, unspecified: Secondary | ICD-10-CM | POA: Insufficient documentation

## 2020-05-25 DIAGNOSIS — Z8042 Family history of malignant neoplasm of prostate: Secondary | ICD-10-CM | POA: Insufficient documentation

## 2020-05-25 DIAGNOSIS — F419 Anxiety disorder, unspecified: Secondary | ICD-10-CM | POA: Insufficient documentation

## 2020-05-25 DIAGNOSIS — Z800 Family history of malignant neoplasm of digestive organs: Secondary | ICD-10-CM | POA: Diagnosis not present

## 2020-05-25 DIAGNOSIS — Z8249 Family history of ischemic heart disease and other diseases of the circulatory system: Secondary | ICD-10-CM | POA: Insufficient documentation

## 2020-05-25 DIAGNOSIS — D696 Thrombocytopenia, unspecified: Secondary | ICD-10-CM

## 2020-05-25 DIAGNOSIS — D72810 Lymphocytopenia: Secondary | ICD-10-CM | POA: Insufficient documentation

## 2020-05-25 DIAGNOSIS — Z79899 Other long term (current) drug therapy: Secondary | ICD-10-CM | POA: Diagnosis not present

## 2020-05-25 DIAGNOSIS — E785 Hyperlipidemia, unspecified: Secondary | ICD-10-CM | POA: Insufficient documentation

## 2020-05-25 LAB — CBC WITH DIFFERENTIAL (CANCER CENTER ONLY)
Abs Immature Granulocytes: 0.02 10*3/uL (ref 0.00–0.07)
Basophils Absolute: 0 10*3/uL (ref 0.0–0.1)
Basophils Relative: 1 %
Eosinophils Absolute: 0.1 10*3/uL (ref 0.0–0.5)
Eosinophils Relative: 4 %
HCT: 42.1 % (ref 36.0–46.0)
Hemoglobin: 14.7 g/dL (ref 12.0–15.0)
Immature Granulocytes: 1 %
Lymphocytes Relative: 20 %
Lymphs Abs: 0.7 10*3/uL (ref 0.7–4.0)
MCH: 32.1 pg (ref 26.0–34.0)
MCHC: 34.9 g/dL (ref 30.0–36.0)
MCV: 91.9 fL (ref 80.0–100.0)
Monocytes Absolute: 0.2 10*3/uL (ref 0.1–1.0)
Monocytes Relative: 6 %
Neutro Abs: 2.5 10*3/uL (ref 1.7–7.7)
Neutrophils Relative %: 68 %
Platelet Count: 200 10*3/uL (ref 150–400)
RBC: 4.58 MIL/uL (ref 3.87–5.11)
RDW: 11.7 % (ref 11.5–15.5)
WBC Count: 3.6 10*3/uL — ABNORMAL LOW (ref 4.0–10.5)
nRBC: 0 % (ref 0.0–0.2)

## 2020-05-25 LAB — IMMATURE PLATELET FRACTION: Immature Platelet Fraction: 2.6 % (ref 1.2–8.6)

## 2020-05-25 LAB — SAVE SMEAR(SSMR), FOR PROVIDER SLIDE REVIEW

## 2020-05-25 LAB — FOLATE: Folate: >100 ng/mL (ref >5.9)

## 2020-05-25 LAB — VITAMIN B12: Vitamin B-12: 3692 pg/mL — ABNORMAL HIGH (ref 180–914)

## 2020-05-25 NOTE — Progress Notes (Signed)
[facility]   [phone] [phone]   Clinic New Consult Note   Patient Care Team: Merri Brunette, MD as PCP - General (Family Medicine) Jens Som Madolyn Frieze, MD as PCP - Cardiology (Cardiology) Blanchard Kelch, NP as Nurse Practitioner (Infectious Diseases) Blanchard Kelch, NP as Nurse Practitioner (Infectious Diseases)  Date of Service:  05/25/2020   CHIEF COMPLAINTS/PURPOSE OF CONSULTATION:  Lymphopenia   REFERRING PHYSICIAN:  ID NP Rexene Alberts  HISTORY OF PRESENTING ILLNESS:  Stacy Watkins 60 y.o. female is a here because of lymphopenia. The patient was referred by ID NP Rexene Alberts. The patient presents to the clinic today alone.  She was diagnosed with HIV in 06/2019. She suspected 8 years ago her then husband gave her HIV. 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 She found out about her HIV diagnosis when she tried to apply for insurance and was denied because of this.   She notes she was very active at baseline. With initial HIV diagnosis she maintained at baseline. In recent months she has become more exhausted and recurrent low grade fever. She notes her viral load of HIV is controlled but her T-cell is still elevated. She notes joint pain, but no night sweats, SOB, cough or GI issues. Doing certain activizes exacerbated her joint pain.   She has PMHx of HLD, on medication. She also has anxiety, depression, HTN. I reviewed her medication list with her. She notes she had D&C on 05/20/20. Her father had prostate cancer and colon cancer   Socially she is divorced/separated after 35 years of marriage. She has 5 adult children. She lives alone. She is a non-smoker, no recreational drug use and does not drink. She is a Clinical research associate.    REVIEW OF SYSTEMS:   Constitutional: Denies fevers, chills or abnormal night sweats Eyes: Denies blurriness of vision, double vision or watery eyes Ears, nose, mouth, throat, and face:  Denies mucositis or sore throat Respiratory: Denies cough, dyspnea or wheezes Cardiovascular: Denies palpitation, chest discomfort or lower extremity swelling Gastrointestinal:  Denies nausea, heartburn or change in bowel habits Skin: Denies abnormal skin rashes Lymphatics: Denies new lymphadenopathy or easy bruising Neurological:Denies numbness, tingling or new weaknesses Behavioral/Psych: Mood is stable, no new changes  All other systems were reviewed with the patient and are negative   MEDICAL HISTORY:  Past Medical History:  Diagnosis Date  . Anxiety   . Depression   . Depression with anxiety   . HIV disease (HCC) 06/08/2019  . Hyperlipidemia   . Hypertension   . Migraine   . PMB (postmenopausal bleeding)   . Seasonal allergies   . Situational stress   . Wears glasses     SURGICAL HISTORY: Past Surgical History:  Procedure Laterality Date  . CARPAL TUNNEL RELEASE Right 2016  . CARPAL TUNNEL RELEASE Left 10/11/2014   Procedure: LEFT  ENDOSCOPIC CARPAL TUNNEL RELEASE;  Surgeon: Mack Hook, MD;  Location: [facility];  Service: Orthopedics;  Laterality: Left;  . CESAREAN SECTION    . CHOLECYSTECTOMY  2017   LAPAROSCOPIC  . COLONSCOPY  LAST DONE 2019  . DILATATION & CURETTAGE/HYSTEROSCOPY WITH MYOSURE N/A 05/20/2020   Procedure: DILATATION & CURETTAGE/HYSTEROSCOPY;  Surgeon: Harold Hedge, MD;  Location: [facility];  Service: Gynecology;  Laterality: N/A;  . DILATION AND CURETTAGE OF UTERUS     X 1  . EYE SURGERY  AGE 70  . TRIGGER FINGER RELEASE Right 12/09/2017   Procedure:  RELEASE TRIGGER FINGER/A-1 PULLEY;  Surgeon: Mack Hook, MD;  Location: [facility];  Service: Orthopedics;  Laterality: Right;    SOCIAL HISTORY: Social History   Socioeconomic History  . Marital status: Legally Separated    Spouse name: Not on file  . Number of children: 5  . Years of education: 31  . Highest education level: Not on file   Occupational History  . Not on file  Tobacco Use  . Smoking status: Never Smoker  . Smokeless tobacco: Never Used  Vaping Use  . Vaping Use: Never used  Substance and Sexual Activity  . Alcohol use: No  . Drug use: No  . Sexual activity: Not Currently    Comment: declined condoms  Other Topics Concern  . Not on file  Social History Narrative   Lives with husband in a one story home.  Has 5 children (2 biological and 3 adopted).  Works as a Clinical research associate.  Education: college.    Social Determinants of Health   Financial Resource Strain: Not on file  Food Insecurity: Not on file  Transportation Needs: Not on file  Physical Activity: Not on file  Stress: Not on file  Social Connections: Not on file  Intimate Partner Violence: Not on file    FAMILY HISTORY: Family History  Problem Relation Age of Onset  . Heart failure Mother   . CAD Father        MI in his 63s  . Heart attack Father   . Prostate cancer Father 9  . Colon cancer Father 59  . CAD Sister        Died at age 25 of MI  . Heart attack Sister   . Hypertension Brother     ALLERGIES:  is allergic to dust mite extract, mold extract  [trichophyton], and trazodone hcl.  MEDICATIONS:  Current Outpatient Medications  Medication Sig Dispense Refill  . ALPRAZolam (XANAX) 0.5 MG tablet 2 (two) times daily as needed.    Marland Kitchen amLODipine (NORVASC) 2.5 MG tablet daily.    Marland Kitchen aspirin 81 MG tablet Take 81 mg by mouth daily.    Marland Kitchen atenolol (TENORMIN) 50 MG tablet Take 50 mg by mouth daily.     Marland Kitchen BIKTARVY 50-200-25 MG TABS tablet TAKE 1 TABLET BY MOUTH EVERY DAY 30 tablet 5  . CALCIUM PO Take by mouth every evening.    . Cholecalciferol (VITAMIN D PO) Take by mouth every evening.    . clonazePAM (KLONOPIN) 0.5 MG tablet Take 0.5 mg by mouth every evening.    . cyclobenzaprine (FLEXERIL) 10 MG tablet TAKE ONE TABLET BY MOUTH 3 TIMES A DAY AS NEEDED. (Patient not taking: No sig reported)    . EMGALITY 120 MG/ML SOAJ Inject into the  skin every 30 (thirty) days.    . NON FORMULARY 75 mg every evening. Pregnenolone    . nortriptyline (PAMELOR) 50 MG capsule Take 50 mg by mouth daily.    . Omega-3 Fatty Acids (FISH OIL PO) Take by mouth. (Patient not taking: No sig reported)    . Probiotic Product (ALIGN PO) Take 1 capsule by mouth 2 (two) times daily. PM AND QHS  PROBIOTIC 25 BILLION PER STRAIN    . rizatriptan (MAXALT) 10 MG tablet Take 10 mg by mouth as needed for migraine. May repeat in 2 hours if needed    . rosuvastatin (CRESTOR) 20 MG tablet TAKE 1 TABLET BY MOUTH EVERY DAY 90 tablet 0  . sulfamethoxazole-trimethoprim (BACTRIM) 400-80 MG  tablet TAKE 1 TABLET BY MOUTH EVERY DAY (Patient taking differently: Take by mouth daily.) 30 tablet 2  . valACYclovir (VALTREX) 1000 MG tablet Take 1 tablet by mouth daily. (Patient not taking: No sig reported)    . VITAMIN A PO Take by mouth every evening.    Marland Kitchen VITAMIN K PO Take by mouth every evening.    . zolpidem (AMBIEN) 10 MG tablet Take 5 mg by mouth.     No current facility-administered medications for this visit.    PHYSICAL EXAMINATION: ECOG PERFORMANCE STATUS: 1 - Symptomatic but completely ambulatory  Vitals:   05/25/20 1503  BP: 131/72  Pulse: 83  Resp: 18  Temp: 97.6 F (36.4 C)  SpO2: 98%   Filed Weights   05/25/20 1503  Weight: 154 lb 3.2 oz (69.9 kg)    GENERAL:alert, no distress and comfortable SKIN: skin color, texture, turgor are normal, no rashes or significant lesions EYES: normal, Conjunctiva are pink and non-injected, sclera clear  NECK: supple, thyroid normal size, non-tender, without nodularity LYMPH:  no palpable lymphadenopathy in the cervical, axillary  LUNGS: clear to auscultation and percussion with normal breathing effort HEART: regular rate & rhythm and no murmurs and no lower extremity edema ABDOMEN:abdomen soft, non-tender and normal bowel sounds. No hepatosplenomegaly Musculoskeletal:no cyanosis of digits and no clubbing  NEURO:  alert & oriented x 3 with fluent speech, no focal motor/sensory deficits  LABORATORY DATA:  I have reviewed the data as listed CBC Latest Ref Rng & Units 05/25/2020 05/18/2020 05/18/2020  WBC 4.0 - 10.5 K/uL 3.6(L) 3.4(L) 2.8(L)  Hemoglobin 12.0 - 15.0 g/dL 41.9 37.9 02.4  Hematocrit 36.0 - 46.0 % 42.1 41.2 41.8  Platelets 150 - 400 K/uL 200 170 160    CMP Latest Ref Rng & Units 05/18/2020 02/04/2020 06/11/2019  Glucose 70 - 99 mg/dL 097(D) - 93  BUN 6 - 20 mg/dL 18 - 22  Creatinine 5.32 - 1.00 mg/dL 9.92 - 4.26  Sodium 834 - 145 mmol/L 140 - 137  Potassium 3.5 - 5.1 mmol/L 4.2 - 4.4  Chloride 98 - 111 mmol/L 105 - 103  CO2 22 - 32 mmol/L 28 - 27  Calcium 8.9 - 10.3 mg/dL 8.9 - 9.4  Total Protein 6.1 - 8.1 g/dL - 6.6 6.6  Total Bilirubin 0.2 - 1.2 mg/dL - 0.4 0.3  AST 10 - 35 U/L - 25 25  ALT 6 - 29 U/L - 19 19     RADIOGRAPHIC STUDIES: I have personally reviewed the radiological images as listed and agreed with the findings in the report. DG Hand 2 View Right  Result Date: 05/15/2020 CLINICAL DATA:  Joint pain and swelling, initial encounter EXAM: RIGHT HAND - 2 VIEW COMPARISON:  None. FINDINGS: No acute fracture or dislocation is noted. No soft tissue abnormality is noted. No erosive changes are seen. IMPRESSION: No acute abnormality noted. Electronically Signed   By: Alcide Clever M.D.   On: 05/15/2020 11:06   DG Hand 2 View Left  Result Date: 05/15/2020 CLINICAL DATA:  Bilateral hand pain and swelling, initial encounter EXAM: LEFT HAND - 2 VIEW COMPARISON:  None. FINDINGS: No acute fracture or dislocation is noted. No erosive changes are seen. 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 No other focal abnormality is seen. IMPRESSION: Mild degenerative change without acute abnormality. Electronically Signed   By: Alcide Clever M.D.   On: 05/15/2020 11:06    ASSESSMENT & PLAN:  Stacy Coral  Watkins is a 60 y.o. Caucasian female with a history of  Anxiety/depression, HLD, HTN   1.  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 She was diagnosed with HIV in 06/2019, but is it likely she went undiagnosed for several years.  His CBC since 2018 has showed mild lymphopenia, no significant change since she started HIV treatment. -I discussed her low lymphocyte can be from HIV. I discussed other possible etiologies such as Lymphoma, EBV, Hep B and C infection (she is negative) or folic acid/B12 deficiency, other autoimmune disease related, liver disease or drug or alcohol use. I have reviewed her medication list and social habits. Her other blood counts are normal, she was negative for Hep B and C tests and no palpable LN on exam today (05/25/20). I have low suspicion for primary pulmonary disease, such as MDS or lymphoma. -We will repeat a CBC with differential, check folate and B12 level, and reviewed her peripheral smear. -The consequence of leukopenia, especially increased risk of viral infection.  I recommend her for flu, HBV vaccine and COVID-vaccine. -I also recommend Evusheld injection for COVID prophylaxis. -Her IgG level is adequate, I do not recommend prophylaxis IVIG infusion -will monitor   2. Fatigue, low grade fever  -She notes symptoms of exhausted and recurrent low grade fevers for 5 years. This resolved after start of HIV treatment but recurred in recent months. I discussed possibility of this being from immune reconstitution after being on HIV medication. She also has joint pain in recent 6 weeks, but rheumatological work up was negative.  -I recommend more work up with labs and CT CAP to rule out lymphoma    3. HIV (+), Epstein-Barr Virus and HPV (+)  -Diagnosed in 06/2019 with HIV. She was diagnosed with EBV before that.  -She is on BIKTARVY. Managed by ID Dr Durwin Nora.  -Given she still has uterus, she gets Pap Smear regularly.  -I recommend she keep up to  date on vaccinations, including Hep C and B. I discussed she may not respond as well as average population.   4. Comorbidities: Anxiety/depression, HLD, HTN -Managed on medication.  -I recommend she continues age appropriate   PLAN:  -refer for Evusheld injecction. Check about costs  -Lab today  -CT CAP wo contrast in 1-2 weeks  -Phone call in 2 weeks    Orders Placed This Encounter  Procedures  . CT CHEST ABDOMEN PELVIS WO CONTRAST    Standing Status:   Future    Standing Expiration Date:   05/25/2021    Order Specific Question:   If indicated for the ordered procedure, I authorize the administration of contrast media per Radiology protocol    Answer:   Yes    Order Specific Question:   Is patient pregnant?    Answer:   No    Order Specific Question:   Preferred imaging location?    Answer:   [facility]    Order Specific Question:   Release to patient    Answer:   Immediate    Order Specific Question:   Is Oral Contrast requested for this exam?    Answer:   Yes, Per Radiology protocol    Order Specific Question:   Reason for Exam (SYMPTOM  OR DIAGNOSIS REQUIRED)    Answer:   lymphopenia and low grade fever, rule out lymphona  . CBC with Differential (Cancer Center Only)    Standing  Status:   Future    Number of Occurrences:   1    Standing Expiration Date:   05/25/2021  . Immature Platelet Fraction    Standing Status:   Future    Number of Occurrences:   1    Standing Expiration Date:   05/25/2021  . Pathologist smear review    Standing Status:   Future    Number of Occurrences:   1    Standing Expiration Date:   05/25/2021  . Save Smear (SSMR)    Standing Status:   Future    Number of Occurrences:   1    Standing Expiration Date:   05/25/2021  . Folate, Serum    Standing Status:   Future    Number of Occurrences:   1    Standing Expiration Date:   05/25/2021  . Vitamin B12    Standing Status:   Future    Number of Occurrences:   1    Standing Expiration  Date:   05/25/2021  . Methylmalonic acid, serum    Standing Status:   Future    Number of Occurrences:   1    Standing Expiration Date:   05/25/2021  . Ambulatory Referral for Evusheld    Referral Priority:   Routine    Referral Type:   Consultation    Referral Reason:   Specialty Services Required    Number of Visits Requested:   1    All questions were answered. The patient knows to call the clinic with any problems, questions or concerns. The total time spent in the appointment was 45 minutes.     Malachy MoodYan Yamel Bale, MD 05/25/2020 4:46 PM  I, Delphina CahillAmoya Bennett, am acting as scribe for Malachy MoodYan Damesha Lawler, MD.   I have reviewed the above documentation for accuracy and completeness, and I agree with the above.

## 2020-05-26 LAB — PATHOLOGIST SMEAR REVIEW

## 2020-05-27 ENCOUNTER — Telehealth: Payer: Self-pay | Admitting: Pharmacy Technician

## 2020-05-27 NOTE — Telephone Encounter (Signed)
Auth Submission: PENDING  Payer: [employer] Medication & CPT/J Code(s) submitted:  ADMININSTRATION FEE ([id number]) Route of submission (phone, fax, portal): PHONE [phone] Stacy Watkins [phone] Auth type:  Units/visits requested:  Reference number:   Will update once we receive a response.

## 2020-05-29 LAB — METHYLMALONIC ACID, SERUM: Methylmalonic Acid, Quantitative: 104 nmol/L (ref 0–378)

## 2020-06-03 ENCOUNTER — Encounter (HOSPITAL_COMMUNITY): Payer: Self-pay

## 2020-06-03 ENCOUNTER — Other Ambulatory Visit: Payer: Self-pay

## 2020-06-03 ENCOUNTER — Ambulatory Visit (HOSPITAL_COMMUNITY)
Admission: RE | Admit: 2020-06-03 | Discharge: 2020-06-03 | Disposition: A | Discharge status: Home / Self Care | Payer: 59 | Source: Ambulatory Visit | Attending: Hematology | Admitting: Hematology

## 2020-06-03 DIAGNOSIS — B20 Human immunodeficiency virus [HIV] disease: Secondary | ICD-10-CM | POA: Diagnosis not present

## 2020-06-03 NOTE — Progress Notes (Signed)
[facility]   [phone] [phone]   Clinic Follow up Note   Patient Care Team: Merri Brunette, MD as PCP - General (Family Medicine) Jens Som Madolyn Frieze, MD as PCP - Cardiology (Cardiology) Blanchard Kelch, NP as Nurse Practitioner (Infectious Diseases) Blanchard Kelch, NP as Nurse Practitioner (Infectious Diseases)   I connected with Stacy Watkins on 06/08/2020 at 11:40 AM EDT by telephone visit and verified that I am speaking with the correct person using two identifiers.  I discussed the limitations, risks, security and privacy concerns of performing an evaluation and management service by telephone and the availability of in person appointments. I also discussed with the patient that there may be a patient responsible charge related to this service. The patient expressed understanding and agreed to proceed.   Other persons participating in the visit and their role in the encounter:  None  Patient's location:  Her home Provider's location:  My Office   CHIEF COMPLAINT: F/u of Lymphopenia    CURRENT THERAPY:  Observation   INTERVAL HISTORY:  Stacy Watkins presents for a virtual follow up of Lymphopenia. She notes she is doing well and no other concerning symptoms.    REVIEW OF SYSTEMS:   Constitutional: Denies fevers, chills or abnormal weight loss Eyes: Denies blurriness of vision Ears, nose, mouth, throat, and face: Denies mucositis or sore throat Respiratory: Denies cough, dyspnea or wheezes Cardiovascular: Denies palpitation, chest discomfort or lower extremity swelling Gastrointestinal:  Denies nausea, heartburn or change in bowel habits Skin: Denies abnormal skin rashes Lymphatics: Denies new lymphadenopathy or easy bruising Neurological:Denies numbness, tingling or new weaknesses Behavioral/Psych: Mood is stable, no new changes  All other systems were reviewed with the patient and are negative.  MEDICAL HISTORY:  Past  Medical History:  Diagnosis Date  . Anxiety   . Depression   . Depression with anxiety   . HIV disease (HCC) 06/08/2019  . Hyperlipidemia   . Hypertension   . Migraine   . PMB (postmenopausal bleeding)   . Seasonal allergies   . Situational stress   . Wears glasses     SURGICAL HISTORY: Past Surgical History:  Procedure Laterality Date  . CARPAL TUNNEL RELEASE Right 2016  . CARPAL TUNNEL RELEASE Left 10/11/2014   Procedure: LEFT  ENDOSCOPIC CARPAL TUNNEL RELEASE;  Surgeon: Mack Hook, MD;  Location: [facility];  Service: Orthopedics;  Laterality: Left;  . CESAREAN SECTION    . CHOLECYSTECTOMY  2017   LAPAROSCOPIC  . COLONSCOPY  LAST DONE 2019  . DILATATION & CURETTAGE/HYSTEROSCOPY WITH MYOSURE N/A 05/20/2020   Procedure: DILATATION & CURETTAGE/HYSTEROSCOPY;  Surgeon: Harold Hedge, MD;  Location: [facility];  Service: Gynecology;  Laterality: N/A;  . DILATION AND CURETTAGE OF UTERUS     X 1  . EYE SURGERY  AGE 29  . TRIGGER FINGER RELEASE Right 12/09/2017   Procedure: RELEASE TRIGGER FINGER/A-1 PULLEY;  Surgeon: Mack Hook, MD;  Location: [facility];  Service: Orthopedics;  Laterality: Right;    I have reviewed the social history and family history with the patient and they are unchanged from previous note.  ALLERGIES:  is allergic to dust mite extract, mold extract  [trichophyton], and trazodone hcl.  MEDICATIONS:  Current Outpatient Medications  Medication Sig Dispense Refill  . ALPRAZolam (XANAX) 0.5 MG tablet 2 (two) times daily as needed.    Marland Kitchen amLODipine (NORVASC) 2.5 MG tablet daily.    Marland Kitchen aspirin 81 MG  tablet Take 81 mg by mouth daily.    Marland Kitchen atenolol (TENORMIN) 50 MG tablet Take 50 mg by mouth daily.     Marland Kitchen BIKTARVY 50-200-25 MG TABS tablet TAKE 1 TABLET BY MOUTH EVERY DAY 30 tablet 5  . CALCIUM PO Take by mouth every evening.    . Cholecalciferol (VITAMIN D PO) Take by mouth every evening.    . clonazePAM  (KLONOPIN) 0.5 MG tablet Take 0.5 mg by mouth every evening.    . cyclobenzaprine (FLEXERIL) 10 MG tablet TAKE ONE TABLET BY MOUTH 3 TIMES A DAY AS NEEDED. (Patient not taking: No sig reported)    . EMGALITY 120 MG/ML SOAJ Inject into the skin every 30 (thirty) days.    . NON FORMULARY 75 mg every evening. Pregnenolone    . nortriptyline (PAMELOR) 50 MG capsule Take 50 mg by mouth daily.    . Omega-3 Fatty Acids (FISH OIL PO) Take by mouth. (Patient not taking: No sig reported)    . Probiotic Product (ALIGN PO) Take 1 capsule by mouth 2 (two) times daily. PM AND QHS  PROBIOTIC 25 BILLION PER STRAIN    . rizatriptan (MAXALT) 10 MG tablet Take 10 mg by mouth as needed for migraine. May repeat in 2 hours if needed    . rosuvastatin (CRESTOR) 20 MG tablet TAKE 1 TABLET BY MOUTH EVERY DAY 90 tablet 0  . sulfamethoxazole-trimethoprim (BACTRIM) 400-80 MG tablet TAKE 1 TABLET BY MOUTH EVERY DAY (Patient taking differently: Take by mouth daily.) 30 tablet 2  . valACYclovir (VALTREX) 1000 MG tablet Take 1 tablet by mouth daily. (Patient not taking: No sig reported)    . VITAMIN A PO Take by mouth every evening.    Marland Kitchen VITAMIN K PO Take by mouth every evening.    . zolpidem (AMBIEN) 10 MG tablet Take 5 mg by mouth.     No current facility-administered medications for this visit.    PHYSICAL EXAMINATION: ECOG PERFORMANCE STATUS: 1 - Symptomatic but completely ambulatory  No vitals taken today, Exam not performed today   LABORATORY DATA:  I have reviewed the data as listed CBC Latest Ref Rng & Units 05/25/2020 05/18/2020 05/18/2020  WBC 4.0 - 10.5 K/uL 3.6(L) 3.4(L) 2.8(L)  Hemoglobin 12.0 - 15.0 g/dL 16.1 09.6 04.5  Hematocrit 36.0 - 46.0 % 42.1 41.2 41.8  Platelets 150 - 400 K/uL 200 170 160     CMP Latest Ref Rng & Units 05/18/2020 02/04/2020 06/11/2019  Glucose 70 - 99 mg/dL 409(W) - 93  BUN 6 - 20 mg/dL 18 - 22  Creatinine 1.19 - 1.00 mg/dL 1.47 - 8.29  Sodium 562 - 145 mmol/L 140 - 137   Potassium 3.5 - 5.1 mmol/L 4.2 - 4.4  Chloride 98 - 111 mmol/L 105 - 103  CO2 22 - 32 mmol/L 28 - 27  Calcium 8.9 - 10.3 mg/dL 8.9 - 9.4  Total Protein 6.1 - 8.1 g/dL - 6.6 6.6  Total Bilirubin 0.2 - 1.2 mg/dL - 0.4 0.3  AST 10 - 35 U/L - 25 25  ALT 6 - 29 U/L - 19 19      RADIOGRAPHIC STUDIES: I have personally reviewed the radiological images as listed and agreed with the findings in the report. No results found.   ASSESSMENT & PLAN:  Stacy Watkins is a 60 y.o. female with   1.  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  She was diagnosed with HIV in 06/2019, but is it likely she went undiagnosed for several years.  His CBC since 2018 has showed mild lymphopenia, no significant change since she started HIV treatment. -I previously discussed her low lymphocyte can be from HIV. I discussed other possible etiologies such as Lymphoma, EBV, Hep B and C infection (she is negative) or folic acid/B12 deficiency, other autoimmune disease related, liver disease or drug or alcohol use. I have reviewed her medication list and social habits. Her other blood counts are normal, she was negative for Hep B and C tests and no palpable LN on initial 05/25/20 exam. I have low suspicion for primary pulmonary disease, such as MDS or lymphoma. -Her CT CAP from 06/03/20 was negative for lymphadenopathy. I personally reviewed the scan images and discussed with her. Her 05/25/20 peripheral Smear was negative, MMA and folate normal and B12 elevated. Her IgG level is adequate, I do not recommend prophylaxis IVIG infusion -I discussed given overall negative workup, her low lymphocyte is highly likely to be from her HIV. I recommend she continue to remain up to date on her vaccination. She is interested in [facility] , I will f/u on referral.  -She will continue to monitor her HIV and labs with her ID Doctor. She is agreeable. I will f/u with her  as needed in the future.   2. Fatigue, low grade fever  -She notes symptoms of exhausted and recurrent low grade fevers for 5 years. This resolved after start of HIV treatment but recurred in recent months. I discussed possibility of this being from immune reconstitution after being on HIV medication. She also has joint pain in recent 6 weeks, but rheumatological work up was negative.  -CT CAP from 06/03/20 was negative.   3. HIV (+), Epstein-Barr Virus and HPV (+)  -Diagnosed in 06/2019 with HIV. She was diagnosed with EBV before that.  -She is on BIKTARVY. Managed by ID Dr Durwin Nora.  -Given she still has uterus, she gets Pap Smear regularly.  -I recommend she keep up to date on vaccinations, including Hep C and B. I discussed she may not respond as well as average population.   4. Comorbidities: Anxiety/depression, HLD, HTN -Managed on medication.  -I recommend she continues age appropriate   PLAN:  -f/u on Evusheld referral  -Continue to f/u with ID doctor for management  -I will f/u with her as needed in the future.    No problem-specific Assessment & Plan notes found for this encounter.   Orders Placed This Encounter  Procedures  . Ambulatory Referral for Evusheld    Referral Priority:   Routine    Referral Type:   Consultation    Referral Reason:   Specialty Services Required    Number of Visits Requested:   1   I discussed the assessment and treatment plan with the patient. The patient was provided an opportunity to ask questions and all were answered. The patient agreed with the plan and demonstrated an understanding of the instructions.  The patient was advised to call back or seek an in-person evaluation if the symptoms worsen or if the condition fails to improve as anticipated.  The total time spent in the appointment was 21 minutes.    Malachy Mood, MD 06/08/2020   I, Delphina Cahill, am acting as scribe for Malachy Mood, MD.   I have reviewed the above documentation for  accuracy and completeness, and I agree with the above.

## 2020-06-03 NOTE — Telephone Encounter (Signed)
Auth Submission: APPROVED  Payer: [employer] Medication & CPT/J Code(s) submitted: F5953 Route of submission (phone, fax, portal): PHONE: [phone] FAX: [phone] Auth type: ADMIN FEE Reference number: [id number] DATE: 05/26/20 - 06/26/20

## 2020-06-07 DIAGNOSIS — D72810 Lymphocytopenia: Secondary | ICD-10-CM | POA: Insufficient documentation

## 2020-06-08 ENCOUNTER — Inpatient Hospital Stay: Payer: 59 | Attending: Hematology | Admitting: Hematology

## 2020-06-08 ENCOUNTER — Encounter: Payer: Self-pay | Admitting: Hematology

## 2020-06-08 ENCOUNTER — Other Ambulatory Visit: Payer: Self-pay

## 2020-06-08 DIAGNOSIS — D72810 Lymphocytopenia: Secondary | ICD-10-CM | POA: Diagnosis not present

## 2020-06-14 ENCOUNTER — Other Ambulatory Visit: Payer: Self-pay | Admitting: Infectious Diseases

## 2020-06-14 ENCOUNTER — Telehealth: Payer: Self-pay

## 2020-06-14 DIAGNOSIS — B20 Human immunodeficiency virus [HIV] disease: Secondary | ICD-10-CM

## 2020-06-14 NOTE — Telephone Encounter (Signed)
Directions for the COVID Infusion Appointment Come to [address] - you will see [facility] on Left and [facility] on Right - Drive between these 2 buildings past the first parking entrance on the left.  Come to the second parking entrance on the left (there will be a sign stating COVID19 Infusion Parking).  Park in this lot and call us at [phone] and someone will come out and bring you inside.     COVID Cost Codes  Evusheld Medication $0 [id number] Administration $450 [id number]

## 2020-06-15 ENCOUNTER — Other Ambulatory Visit: Payer: Self-pay

## 2020-06-15 ENCOUNTER — Ambulatory Visit (INDEPENDENT_AMBULATORY_CARE_PROVIDER_SITE_OTHER): Payer: 59 | Admitting: *Deleted

## 2020-06-15 DIAGNOSIS — Z298 Encounter for other specified prophylactic measures: Secondary | ICD-10-CM | POA: Diagnosis not present

## 2020-06-15 DIAGNOSIS — B20 Human immunodeficiency virus [HIV] disease: Secondary | ICD-10-CM

## 2020-06-15 MED ORDER — METHYLPREDNISOLONE SODIUM SUCC 125 MG IJ SOLR: 125.0000 mg | Freq: Once | INTRAMUSCULAR | Status: DC | PRN

## 2020-06-15 MED ORDER — TIXAGEVIMAB (PART OF EVUSHELD) INJECTION
300.0000 mg | Freq: Once | INTRAMUSCULAR | Status: AC
  Administered 2020-06-15: 300 mg via INTRAMUSCULAR

## 2020-06-15 MED ORDER — CILGAVIMAB (PART OF EVUSHELD) INJECTION
300.0000 mg | Freq: Once | INTRAMUSCULAR | Status: AC
Start: 2020-06-15 — End: 2020-06-15
  Administered 2020-06-15: 300 mg via INTRAMUSCULAR

## 2020-06-15 MED ORDER — DIPHENHYDRAMINE HCL 50 MG/ML IJ SOLN: 50.0000 mg | Freq: Once | INTRAMUSCULAR | Status: AC | PRN

## 2020-06-15 MED ORDER — EPINEPHRINE 0.3 MG/0.3ML IJ SOAJ: 0.3000 mg | Freq: Once | INTRAMUSCULAR | Status: AC | PRN

## 2020-06-15 MED ORDER — ALBUTEROL SULFATE HFA 108 (90 BASE) MCG/ACT IN AERS
2.0000 | INHALATION_SPRAY | Freq: Once | RESPIRATORY_TRACT | Status: AC | PRN
Start: 2020-06-15 — End: ?

## 2020-06-15 NOTE — Progress Notes (Signed)
Diagnosis: Pre-COVID Exposure Prophylaxis  Provider:  Chilton Greathouse, MD  Procedure: Injection  Evusheld, Dose: 300 mg, Site: intramuscular  Discharge: Condition: Good, Destination: Home . AVS provided to patient.   Performed by:  Forrest Moron, RN

## 2020-08-01 NOTE — Telephone Encounter (Signed)
I would suggest she calls her insurance provider to let them know that her doctor's office got a prior approval for this. Otherwise, I have never done one of these so I'm not sure. I would probably ask that she reach out to the infusion center so she can talk/discuss with Yatin/the person who completed the PA.  Also, just because there is a prior approval doesn't mean it is free. So, it could be that she has a fee for the medication or services provided. I've never done a PA for this, so I have no clue about the fees and what the PA covered specifically. The PA I found in the media tab is very nonspecific and does not say anything other than "it's approved".

## 2020-08-09 ENCOUNTER — Ambulatory Visit: Payer: 59 | Admitting: Cardiology

## 2020-08-12 ENCOUNTER — Other Ambulatory Visit: Payer: Self-pay

## 2020-08-12 ENCOUNTER — Other Ambulatory Visit: Payer: 59

## 2020-08-12 DIAGNOSIS — B20 Human immunodeficiency virus [HIV] disease: Secondary | ICD-10-CM

## 2020-08-12 NOTE — Addendum Note (Signed)
Addended by: Blanchard Kelch on: 08/12/2020 08:58 AM   Modules accepted: Orders

## 2020-08-15 ENCOUNTER — Other Ambulatory Visit: Payer: Self-pay | Admitting: Infectious Diseases

## 2020-08-15 DIAGNOSIS — B20 Human immunodeficiency virus [HIV] disease: Secondary | ICD-10-CM

## 2020-08-15 LAB — COMPREHENSIVE METABOLIC PANEL
AG Ratio: 1.9 (calc) (ref 1.0–2.5)
ALT: 17 U/L (ref 6–29)
AST: 17 U/L (ref 10–35)
Albumin: 4.2 g/dL (ref 3.6–5.1)
Alkaline phosphatase (APISO): 59 U/L (ref 37–153)
BUN: 12 mg/dL (ref 7–25)
CO2: 31 mmol/L (ref 20–32)
Calcium: 8.9 mg/dL (ref 8.6–10.4)
Chloride: 105 mmol/L (ref 98–110)
Creat: 0.92 mg/dL (ref 0.50–1.05)
Globulin: 2.2 g/dL (calc) (ref 1.9–3.7)
Glucose, Bld: 121 mg/dL — ABNORMAL HIGH (ref 65–99)
Potassium: 4.3 mmol/L (ref 3.5–5.3)
Sodium: 139 mmol/L (ref 135–146)
Total Bilirubin: 0.4 mg/dL (ref 0.2–1.2)
Total Protein: 6.4 g/dL (ref 6.1–8.1)

## 2020-08-15 LAB — HIV-1 RNA QUANT-NO REFLEX-BLD
HIV 1 RNA Quant: <20 Copies/mL — ABNORMAL HIGH
HIV-1 RNA Quant, Log: <1.3 Log cps/mL — ABNORMAL HIGH

## 2020-08-15 LAB — T-HELPER CELLS (CD4) COUNT (NOT AT ARMC)
Absolute CD4: 78 cells/uL — ABNORMAL LOW (ref 490–1740)
CD4 T Helper %: 11 % — ABNORMAL LOW (ref 30–61)
Total lymphocyte count: 739 cells/uL — ABNORMAL LOW (ref 850–3900)

## 2020-08-15 LAB — LIPID PANEL
Cholesterol: 81 mg/dL (ref <200)
HDL: 34 mg/dL — ABNORMAL LOW (ref >=50)
LDL Cholesterol (Calc): 30 mg/dL (calc)
Non-HDL Cholesterol (Calc): 47 mg/dL (calc) (ref <130)
Total CHOL/HDL Ratio: 2.4 (calc) (ref <5.0)
Triglycerides: 85 mg/dL (ref <150)

## 2020-08-19 NOTE — Progress Notes (Signed)
HPI: FU CAD. Stress echo 6/15 normal. Admitted March 2018 with syncope resulting in nasal fracture. Episode was felt likely to have been orthostatic mediated. Troponins were normal. Nuclear study 3/18 showed EF 73 and normal perfusion. Echocardiogram April 2018 showed normal LV function and grade 1 diastolic dysfunction. Monitor 3/18  showed sinus rhythm with no significant arrhythmia.  Cardiac CTA June 2021 showed calcium score of 0 and mild noncalcified plaque in the proximal LAD at 25 to 49%.  FFR was negative.  Since last seen, there is no dyspnea, chest pain, palpitations or syncope.  There is some fatigue.  Current Outpatient Medications  Medication Sig Dispense Refill   ALPRAZolam (XANAX) 0.5 MG tablet 2 (two) times daily as needed.     amLODipine (NORVASC) 2.5 MG tablet daily.     aspirin 81 MG tablet Take 81 mg by mouth daily.     atenolol (TENORMIN) 50 MG tablet Take 50 mg by mouth daily.      BIKTARVY 50-200-25 MG TABS tablet TAKE 1 TABLET BY MOUTH EVERY DAY 30 tablet 5   CALCIUM PO Take by mouth every evening.     Cholecalciferol (VITAMIN D PO) Take by mouth every evening.     clonazePAM (KLONOPIN) 0.5 MG tablet Take 0.5 mg by mouth every evening.     cyclobenzaprine (FLEXERIL) 10 MG tablet TAKE ONE TABLET BY MOUTH 3 TIMES A DAY AS NEEDED. (Patient not taking: No sig reported)     EMGALITY 120 MG/ML SOAJ Inject into the skin every 30 (thirty) days.     NON FORMULARY 75 mg every evening. Pregnenolone     nortriptyline (PAMELOR) 50 MG capsule Take 50 mg by mouth daily.     Omega-3 Fatty Acids (FISH OIL PO) Take by mouth. (Patient not taking: No sig reported)     Probiotic Product (ALIGN PO) Take 1 capsule by mouth 2 (two) times daily. PM AND QHS  PROBIOTIC 25 BILLION PER STRAIN     rizatriptan (MAXALT) 10 MG tablet Take 10 mg by mouth as needed for migraine. May repeat in 2 hours if needed     rosuvastatin (CRESTOR) 20 MG tablet TAKE 1 TABLET BY MOUTH EVERY DAY 90 tablet 0    sulfamethoxazole-trimethoprim (BACTRIM) 400-80 MG tablet TAKE 1 TABLET BY MOUTH EVERY DAY 30 tablet 0   valACYclovir (VALTREX) 1000 MG tablet Take 1 tablet by mouth daily. (Patient not taking: No sig reported)     VITAMIN A PO Take by mouth every evening.     VITAMIN K PO Take by mouth every evening.     zolpidem (AMBIEN) 10 MG tablet Take 5 mg by mouth.     Current Facility-Administered Medications  Medication Dose Route Frequency Provider Last Rate Last Admin   albuterol (VENTOLIN HFA) 108 (90 Base) MCG/ACT inhaler 2 puff  2 puff Inhalation Once PRN Blanchard Kelch, NP       diphenhydrAMINE (BENADRYL) injection 50 mg  50 mg Intramuscular Once PRN Blanchard Kelch, NP       EPINEPHrine (EPI-PEN) injection 0.3 mg  0.3 mg Intramuscular Once PRN Blanchard Kelch, NP       methylPREDNISolone sodium succinate (SOLU-MEDROL) 125 mg/2 mL injection 125 mg  125 mg Intramuscular Once PRN Blanchard Kelch, NP         Past Medical History:  Diagnosis Date   Anxiety    Depression    Depression with anxiety    HIV disease (HCC) 06/08/2019  Hyperlipidemia    Hypertension    Migraine    PMB (postmenopausal bleeding)    Seasonal allergies    Situational stress    Wears glasses     Past Surgical History:  Procedure Laterality Date   CARPAL TUNNEL RELEASE Right 2016   CARPAL TUNNEL RELEASE Left 10/11/2014   Procedure: LEFT  ENDOSCOPIC CARPAL TUNNEL RELEASE;  Surgeon: Mack Hook, MD;  Location: [facility];  Service: Orthopedics;  Laterality: Left;   CESAREAN SECTION     CHOLECYSTECTOMY  2017   LAPAROSCOPIC   COLONSCOPY  LAST DONE 2019   DILATATION & CURETTAGE/HYSTEROSCOPY WITH MYOSURE N/A 05/20/2020   Procedure: DILATATION & CURETTAGE/HYSTEROSCOPY;  Surgeon: Harold Hedge, MD;  Location: [facility];  Service: Gynecology;  Laterality: N/A;   DILATION AND CURETTAGE OF UTERUS     X 1   EYE SURGERY  AGE 38   TRIGGER FINGER RELEASE Right 12/09/2017    Procedure: RELEASE TRIGGER FINGER/A-1 PULLEY;  Surgeon: Mack Hook, MD;  Location: [facility];  Service: Orthopedics;  Laterality: Right;    Social History   Socioeconomic History   Marital status: Legally Separated    Spouse name: Not on file   Number of children: 5   Years of education: 16   Highest education level: Not on file  Occupational History   Not on file  Tobacco Use   Smoking status: Never   Smokeless tobacco: Never  Vaping Use   Vaping Use: Never used  Substance and Sexual Activity   Alcohol use: No   Drug use: No   Sexual activity: Not Currently    Comment: declined condoms  Other Topics Concern   Not on file  Social History Narrative   Lives with husband in a one story home.  Has 5 children (2 biological and 3 adopted).  Works as a Clinical research associate.  Education: college.    Social Determinants of Health   Financial Resource Strain: Not on file  Food Insecurity: Not on file  Transportation Needs: Not on file  Physical Activity: Not on file  Stress: Not on file  Social Connections: Not on file  Intimate Partner Violence: Not on file    Family History  Problem Relation Age of Onset   Heart failure Mother    CAD Father        MI in his 83s   Heart attack Father    Prostate cancer Father 36   Colon cancer Father 72   CAD Sister        Died at age 54 of MI   Heart attack Sister    Hypertension Brother     ROS: no fevers or chills, productive cough, hemoptysis, dysphasia, odynophagia, melena, hematochezia, dysuria, hematuria, rash, seizure activity, orthopnea, PND, pedal edema, claudication. Remaining systems are negative.  Physical Exam: Well-developed well-nourished in no acute distress.  Skin is warm and dry.  HEENT is normal.  Neck is supple.  Chest is clear to auscultation with normal expansion.  Cardiovascular exam is regular rate and rhythm.  Abdominal exam nontender or distended. No masses palpated. Extremities show no  edema. neuro grossly intact  ECG-normal sinus rhythm at a rate of 79, normal axis, no ST changes.  Personally reviewed  A/P  1 coronary artery disease-patient denies chest pain.  Continue medical therapy with aspirin and statin.  2 palpitations-continue beta-blocker at present dose.  Symptoms are controlled.  3 hypertension-patient's blood pressure is controlled today.  Continue present medical regimen.  4 hyperlipidemia-continue statin.  5 history of syncope-previous episode felt to be orthostatic mediated.  No recurrences.  Olga Millers, MD

## 2020-08-25 ENCOUNTER — Encounter: Payer: Self-pay | Admitting: Infectious Diseases

## 2020-08-25 ENCOUNTER — Other Ambulatory Visit: Payer: Self-pay | Admitting: Infectious Diseases

## 2020-08-25 ENCOUNTER — Other Ambulatory Visit: Payer: Self-pay

## 2020-08-25 ENCOUNTER — Ambulatory Visit (INDEPENDENT_AMBULATORY_CARE_PROVIDER_SITE_OTHER): Payer: 59 | Admitting: Infectious Diseases

## 2020-08-25 DIAGNOSIS — Z7185 Encounter for immunization safety counseling: Secondary | ICD-10-CM | POA: Diagnosis not present

## 2020-08-25 DIAGNOSIS — B20 Human immunodeficiency virus [HIV] disease: Secondary | ICD-10-CM

## 2020-08-25 DIAGNOSIS — R5383 Other fatigue: Secondary | ICD-10-CM

## 2020-08-25 MED ORDER — FOSTEMSAVIR TROMETHAMINE ER 600 MG PO TB12: 600.0000 mg | ORAL_TABLET | Freq: Two times a day (BID) | ORAL | 5 refills | Status: DC

## 2020-08-25 NOTE — Assessment & Plan Note (Signed)
Received Evusheld in August 2022 - current recommendations to repeat injection 6 months later. We discussed today - can help to arrange this for her. I would recommend it given CD4 stilll < 200.

## 2020-08-25 NOTE — Assessment & Plan Note (Signed)
No worse no better. Working with new supplements as directed by [employer]. I cannot guarantee she will feel better even if her CD4 came up back to normal range. Hopeful for improvement. Lots of reassuring things returning negative for previous work ups and CT scan.

## 2020-08-25 NOTE — Assessment & Plan Note (Signed)
CD4 count remains less than 100 after 1 year of treatment.  I think it would be worth trialing [place] twice daily for her to see if this moves her CD4 count recovery to a more favorable range for her.  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 She is willing to try and see how this trends.  Will have her return in 36m for a lab only visit to recheck CD4 and viral load and have her back in office in 6 months. Continue bactrim once daily.

## 2020-08-25 NOTE — Progress Notes (Signed)
lp     Patient: Stacy Watkins  DOB: [birth year] MRN: [id number] PCP: Carol Ada, MD     Subjective:  Brief Narrative: [name] is a 60 y.o. female with HIV, AIDS+ Dx 05/2019 CD4 nadir < 35 HIV Risk: sexual OI: oral thrush Intake Labs: Hep B sAg (-), sAb (-), cAb (-), Hep A Ab (-), Hep C Ab (-) Quantiferon (-) HLA B*[id number] (pending)  Previous Regimens: Biktarvy >> suppressed   Genotype:  06-2019 - wildtype   Chief Complaint  Patient presents with   Follow-up      HPI: [name] is here for follow up today. Feels about the same regarding energy level and fatigue. Joint pain seems a little better. She has been back to see her provider at [facility] and started on naltrexone L-lysine for EBV suppression as well as a few other supplements aimed to support her immune system. Pretty soon to tell if this has offered any improvement yet but hopeful.    She has a trip to [place] soon with her family and worried about fatigue that will cause but overall looking forward to her time with family. She is interested in taking a stronger role in community advocacy for women living with HIV through writing and speaking.      Review of Systems  Constitutional:  Positive for fatigue. Negative for activity change, appetite change, chills, fever and unexpected weight change.  HENT:  Negative for sore throat.   Respiratory:  Negative for cough and shortness of breath.   Cardiovascular: Negative.   Gastrointestinal:  Negative for abdominal pain, diarrhea and vomiting.  Musculoskeletal:  Positive for arthralgias (improved). Negative for myalgias and neck pain.  Skin:  Negative for rash.  Neurological:  Negative for headaches.     HPI: [name] is here for follow up today. Feels about the same regarding energy level and fatigue. Joint pain seems a little better. She has been back to see her provider at [facility] and started on naltrexone L-lysine for EBV suppression as well as a  few other supplements aimed to support her immune system. Pretty soon to tell if this has offered any improvement yet but hopeful.    She has a trip to [place] soon with her family and worried about fatigue that will cause but overall looking forward to her time with family. She is interested in taking a stronger role in community advocacy for women living with HIV through writing and speaking.      Review of Systems  Constitutional:  Positive for fatigue. Negative for activity change, appetite change, chills, fever and unexpected weight change.  HENT:  Negative for sore throat.   Respiratory:  Negative for cough and shortness of breath.   Cardiovascular: Negative.   Gastrointestinal:  Negative for abdominal pain, diarrhea and vomiting.  Musculoskeletal:  Positive for arthralgias (improved). Negative for myalgias and neck pain.  Skin:  Negative for rash.  Neurological:  Negative for headaches.       Past Medical History:  Diagnosis Date   Anxiety    Depression    Depression with anxiety    HIV disease ([place]) 06/08/2019   Hyperlipidemia    Hypertension    Migraine    PMB (postmenopausal bleeding)    Seasonal allergies    Situational stress    Wears glasses     Outpatient Medications Prior to Visit  Medication Sig Dispense Refill   amLODipine (NORVASC) 2.5 MG tablet daily.     aspirin 81  MG tablet Take 81 mg by mouth daily.     atenolol (TENORMIN) 50 MG tablet Take 50 mg by mouth daily.      BIKTARVY 50-200-25 MG TABS tablet TAKE 1 TABLET BY MOUTH EVERY DAY 30 tablet 5   CALCIUM PO Take by mouth every evening.     Cholecalciferol (VITAMIN D PO) Take by mouth every evening.     clonazePAM (KLONOPIN) 0.5 MG tablet Take 0.5 mg by mouth every evening.     cyclobenzaprine (FLEXERIL) 10 MG tablet TAKE ONE TABLET BY MOUTH 3 TIMES A DAY AS NEEDED.     EMGALITY 120 MG/ML SOAJ Inject into the skin every 30 (thirty) days.     NON FORMULARY 75 mg every evening. Pregnenolone      nortriptyline (PAMELOR) 50 MG capsule Take 50 mg by mouth daily.     Probiotic Product (ALIGN PO) Take 1 capsule by mouth 2 (two) times daily. PM AND QHS  PROBIOTIC 25 BILLION PER STRAIN     rosuvastatin (CRESTOR) 20 MG tablet TAKE 1 TABLET BY MOUTH EVERY DAY 90 tablet 0   sulfamethoxazole-trimethoprim (BACTRIM) 400-80 MG tablet TAKE 1 TABLET BY MOUTH EVERY DAY 30 tablet 0   ALPRAZolam (XANAX) 0.5 MG tablet 2 (two) times daily as needed.     Omega-3 Fatty Acids (FISH OIL PO) Take by mouth. (Patient not taking: No sig reported)     rizatriptan (MAXALT) 10 MG tablet Take 10 mg by mouth as needed for migraine. May repeat in 2 hours if needed     valACYclovir (VALTREX) 1000 MG tablet Take 1 tablet by mouth daily. (Patient not taking: No sig reported)     VITAMIN A PO Take by mouth every evening.     VITAMIN K PO Take by mouth every evening.     zolpidem (AMBIEN) 10 MG tablet Take 5 mg by mouth.     Facility-Administered Medications Prior to Visit  Medication Dose Route Frequency Provider Last Rate Last Admin   albuterol (VENTOLIN HFA) 108 (90 Base) MCG/ACT inhaler 2 puff  2 puff Inhalation Once PRN Sutton Callas, NP       diphenhydrAMINE (BENADRYL) injection 50 mg  50 mg Intramuscular Once PRN Winfall Callas, NP       EPINEPHrine (EPI-PEN) injection 0.3 mg  0.3 mg Intramuscular Once PRN Kerens Callas, NP       methylPREDNISolone sodium succinate (SOLU-MEDROL) 125 mg/2 mL injection 125 mg  125 mg Intramuscular Once PRN Spearman Callas, NP         Allergies  Allergen Reactions   Dust Mite Extract Other (See Comments)   Mold Extract  [Trichophyton] Other (See Comments)   Trazodone Hcl Other (See Comments)    NOT SURE REACTION    Social History   Tobacco Use   Smoking status: Never   Smokeless tobacco: Never  Vaping Use   Vaping Use: Never used  Substance Use Topics   Alcohol use: No   Drug use: No     Objective:   Vitals:   08/25/20 0931  BP: 131/77  Pulse: 88   Temp: 98.3 F (36.8 C)  TempSrc: Oral  SpO2: 99%  Weight: 154 lb (69.9 kg)   Body mass index is 25.63 kg/m.   Physical Exam Constitutional:      Appearance: Normal appearance. She is not ill-appearing.     Comments: Well-appearing today.   HENT:     Mouth/Throat:     Mouth: Mucous membranes are  moist. No oral lesions.     Dentition: No dental abscesses.     Pharynx: Oropharynx is clear.  Eyes:     General: No scleral icterus. Cardiovascular:     Rate and Rhythm: Normal rate and regular rhythm.     Heart sounds: Normal heart sounds.  Pulmonary:     Effort: Pulmonary effort is normal.     Breath sounds: Normal breath sounds.  Abdominal:     General: There is no distension.     Palpations: Abdomen is soft.     Tenderness: There is no abdominal tenderness.  Musculoskeletal:        General: No tenderness. Normal range of motion.  Lymphadenopathy:     Cervical: No cervical adenopathy.  Skin:    General: Skin is warm and dry.     Findings: No rash.  Neurological:     Mental Status: She is alert and oriented to person, place, and time.  Psychiatric:        Mood and Affect: Mood normal.        Thought Content: Thought content normal.        Judgment: Judgment normal.     Lab Results: Lab Results  Component Value Date   WBC 3.6 (L) 05/25/2020   HGB 14.7 05/25/2020   HCT 42.1 05/25/2020   MCV 91.9 05/25/2020   PLT 200 05/25/2020    Lab Results  Component Value Date   CREATININE 0.92 08/12/2020   BUN 12 08/12/2020   NA 139 08/12/2020   K 4.3 08/12/2020   CL 105 08/12/2020   CO2 31 08/12/2020    Lab Results  Component Value Date   ALT 17 08/12/2020   AST 17 08/12/2020   BILITOT 0.4 08/12/2020     Assessment & Plan:   Problem List Items Addressed This Visit       Unprioritized   Vaccine counseling    Received Evusheld in August 2022 - current recommendations to repeat injection 6 months later. We discussed today - can help to arrange this for her. I  would recommend it given CD4 stilll < 200.       HIV disease ([place])    Doing well on once daily Biktarvy with suppressed viral loads. Continue this with addition of Rukobia as outlined below.       Relevant Medications   fostemsavir tromethamine (RUKOBIA) 600 MG TB12 ER tablet   Other Relevant Orders   HIV-1 RNA quant-no reflex-bld   T-helper cell (CD4)- ([facility] clinic only)   Fatigue    No worse no better. Working with new supplements as directed by [facility]. I cannot guarantee she will feel better even if her CD4 came up back to normal range. Hopeful for improvement. Lots of reassuring things returning negative for previous work ups and CT scan.       AIDS (acquired immune deficiency syndrome) (HCC)    CD4 count remains less than 100 after 1 year of treatment.  I think it would be worth trialing [place] twice daily for her to see if this moves her CD4 count recovery to a more favorable range for her.  In reference to the [facility] trial there was significant improvement in CD4 count recovery over the 96 weeks of data capture with ~75% of the enrolled patients with AIDS defining CD4 counts. She is willing to try and see how this trends.  Will have her return in 36mfor a lab only visit to recheck CD4 and viral load  and have her back in office in 6 months. Continue bactrim once daily.       Relevant Medications   fostemsavir tromethamine (RUKOBIA) 600 MG TB12 ER tablet   Return in about 6 months (around 02/25/2021).   Janene Madeira, MSN, NP-C [facility] [place] Pager: [phone] Office: [phone]

## 2020-08-25 NOTE — Patient Instructions (Addendum)
Wonderful to see you!  Enjoy [place] - I hope it's not too hot down there!  CONTINUE your biktarvy once a day   Will START Rukobia ONE pill TWICE a day for you. We are adding this to see if it may help your immune system recover a bit more. There was a unique observation in the trials that the people who took this medication had an increase in their total CD4 count over 24-96 weeks of data.       Please schedule a lab only visit in 3 months then a office visit with me in 6 months - we can do labs prior to the 82m visit or same day, whichever you prefer.   Would consider repeating the Evusheld injection 6 months.

## 2020-08-25 NOTE — Assessment & Plan Note (Addendum)
Doing well on once daily Biktarvy with suppressed viral loads. Continue this with addition of Rukobia as outlined below.

## 2020-08-30 ENCOUNTER — Encounter: Payer: Self-pay | Admitting: Cardiology

## 2020-08-30 ENCOUNTER — Ambulatory Visit (INDEPENDENT_AMBULATORY_CARE_PROVIDER_SITE_OTHER): Payer: 59 | Admitting: Cardiology

## 2020-08-30 ENCOUNTER — Other Ambulatory Visit: Payer: Self-pay

## 2020-08-30 ENCOUNTER — Other Ambulatory Visit (HOSPITAL_COMMUNITY): Payer: Self-pay

## 2020-08-30 VITALS — BP 120/74 | HR 79 | Ht 65.0 in | Wt 155.2 lb

## 2020-08-30 DIAGNOSIS — R002 Palpitations: Secondary | ICD-10-CM | POA: Diagnosis not present

## 2020-08-30 DIAGNOSIS — I2510 Atherosclerotic heart disease of native coronary artery without angina pectoris: Secondary | ICD-10-CM

## 2020-08-30 DIAGNOSIS — E7800 Pure hypercholesterolemia, unspecified: Secondary | ICD-10-CM | POA: Diagnosis not present

## 2020-08-30 NOTE — Patient Instructions (Signed)

## 2020-09-06 ENCOUNTER — Other Ambulatory Visit (HOSPITAL_COMMUNITY): Payer: Self-pay

## 2020-09-06 ENCOUNTER — Telehealth: Payer: Self-pay

## 2020-09-06 NOTE — Telephone Encounter (Signed)
FYI

## 2020-09-06 NOTE — Telephone Encounter (Signed)
[facility] Patient Advocate Encounter  Prior Authorization for Stacy Watkins has been approved.    PA# [id number] Effective dates: 09/03/20 through 09/02/21  Patients co-pay is $0.00.   [facility] will continue to follow.  Stacy Watkins, CPhT [facility] Phone: [phone] Fax:  [phone]

## 2020-09-09 NOTE — Telephone Encounter (Signed)
Received fax from [facility] requesting PA information. RN relayed info to [facility] via voicemail from Donna's note. Andree Coss, RN

## 2020-09-12 NOTE — Telephone Encounter (Signed)
Great job, Arts administrator!

## 2020-09-17 ENCOUNTER — Other Ambulatory Visit: Payer: Self-pay | Admitting: Infectious Diseases

## 2020-09-17 DIAGNOSIS — B20 Human immunodeficiency virus [HIV] disease: Secondary | ICD-10-CM

## 2020-09-19 ENCOUNTER — Other Ambulatory Visit: Payer: Self-pay | Admitting: Cardiology

## 2020-09-19 DIAGNOSIS — E7800 Pure hypercholesterolemia, unspecified: Secondary | ICD-10-CM

## 2020-10-06 ENCOUNTER — Encounter: Payer: Self-pay | Admitting: Infectious Diseases

## 2020-11-09 ENCOUNTER — Other Ambulatory Visit: Payer: Self-pay | Admitting: Family Medicine

## 2020-11-09 ENCOUNTER — Ambulatory Visit
Admission: RE | Admit: 2020-11-09 | Discharge: 2020-11-09 | Disposition: A | Discharge status: Home / Self Care | Payer: 59 | Source: Ambulatory Visit | Attending: Family Medicine | Admitting: Family Medicine

## 2020-11-09 ENCOUNTER — Other Ambulatory Visit: Payer: Self-pay

## 2020-11-09 DIAGNOSIS — E041 Nontoxic single thyroid nodule: Secondary | ICD-10-CM

## 2020-11-09 DIAGNOSIS — Q740 Other congenital malformations of upper limb(s), including shoulder girdle: Secondary | ICD-10-CM

## 2020-11-12 ENCOUNTER — Other Ambulatory Visit: Payer: Self-pay | Admitting: Infectious Diseases

## 2020-11-12 DIAGNOSIS — B20 Human immunodeficiency virus [HIV] disease: Secondary | ICD-10-CM

## 2020-11-17 ENCOUNTER — Ambulatory Visit
Admission: RE | Admit: 2020-11-17 | Discharge: 2020-11-17 | Disposition: A | Discharge status: Home / Self Care | Payer: 59 | Source: Ambulatory Visit | Attending: Family Medicine | Admitting: Family Medicine

## 2020-11-17 ENCOUNTER — Other Ambulatory Visit: Payer: Self-pay | Admitting: Infectious Diseases

## 2020-11-17 DIAGNOSIS — B20 Human immunodeficiency virus [HIV] disease: Secondary | ICD-10-CM

## 2020-11-17 DIAGNOSIS — E041 Nontoxic single thyroid nodule: Secondary | ICD-10-CM

## 2020-11-18 ENCOUNTER — Other Ambulatory Visit: Payer: 59

## 2020-11-18 ENCOUNTER — Other Ambulatory Visit: Payer: Self-pay

## 2020-11-18 DIAGNOSIS — B20 Human immunodeficiency virus [HIV] disease: Secondary | ICD-10-CM

## 2020-11-21 LAB — T-HELPER CELLS (CD4) COUNT (NOT AT ARMC)
Absolute CD4: 105 cells/uL — ABNORMAL LOW (ref 490–1740)
CD4 T Helper %: 14 % — ABNORMAL LOW (ref 30–61)
Total lymphocyte count: 765 cells/uL — ABNORMAL LOW (ref 850–3900)

## 2020-11-21 LAB — HIV-1 RNA QUANT-NO REFLEX-BLD
HIV 1 RNA Quant: NOT DETECTED Copies/mL
HIV-1 RNA Quant, Log: NOT DETECTED Log cps/mL

## 2020-11-22 ENCOUNTER — Other Ambulatory Visit: Payer: 59

## 2020-12-29 ENCOUNTER — Other Ambulatory Visit: Payer: Self-pay | Admitting: Infectious Diseases

## 2020-12-29 DIAGNOSIS — B20 Human immunodeficiency virus [HIV] disease: Secondary | ICD-10-CM

## 2021-01-31 ENCOUNTER — Other Ambulatory Visit: Payer: Self-pay

## 2021-02-01 ENCOUNTER — Other Ambulatory Visit: Payer: Self-pay

## 2021-02-01 ENCOUNTER — Other Ambulatory Visit (HOSPITAL_COMMUNITY)
Admission: RE | Admit: 2021-02-01 | Discharge: 2021-02-01 | Disposition: A | Discharge status: Home / Self Care | Payer: 59 | Source: Ambulatory Visit | Attending: Obstetrics and Gynecology | Admitting: Obstetrics and Gynecology

## 2021-02-01 ENCOUNTER — Encounter: Payer: Self-pay | Admitting: Obstetrics and Gynecology

## 2021-02-01 ENCOUNTER — Ambulatory Visit (INDEPENDENT_AMBULATORY_CARE_PROVIDER_SITE_OTHER): Payer: 59 | Admitting: Obstetrics and Gynecology

## 2021-02-01 DIAGNOSIS — B20 Human immunodeficiency virus [HIV] disease: Secondary | ICD-10-CM

## 2021-02-01 DIAGNOSIS — Z01419 Encounter for gynecological examination (general) (routine) without abnormal findings: Secondary | ICD-10-CM | POA: Diagnosis not present

## 2021-02-01 NOTE — Progress Notes (Signed)
Patient ID: Stacy Watkins, female   DOB: 1960-09-09, 61 y.o.   MRN: [id number]  Stacy Watkins is a 61 y.o. [id number] female here for a routine annual gynecologic exam.  Current complaints: None.   Denies abnormal vaginal bleeding, discharge, pelvic pain, problems with intercourse or other gynecologic concerns.    Gynecologic History Patient's last menstrual period was 09/24/2012. Contraception: abstinence Last Pap: 6/21. Results were: ASCUS ,HPV negative Last mammogram: 4/22. Results were: normal  Obstetric History OB History  Gravida Para Term Preterm AB Living  3 2     1 2   SAB IAB Ectopic Multiple Live Births               # Outcome Date GA Lbr Len/2nd Weight Sex Delivery Anes PTL Lv  3 Para           2 Para           1 AB             Past Medical History:  Diagnosis Date   Anxiety    Depression    Depression with anxiety    HIV disease (HCC) 06/08/2019   Hyperlipidemia    Hypertension    Migraine    PMB (postmenopausal bleeding)    Seasonal allergies    Situational stress    Vaginal Pap smear, abnormal    Wears glasses     Past Surgical History:  Procedure Laterality Date   CARPAL TUNNEL RELEASE Right 2016   CARPAL TUNNEL RELEASE Left 10/11/2014   Procedure: LEFT  ENDOSCOPIC CARPAL TUNNEL RELEASE;  Surgeon: 12/11/2014, MD;  Location: [facility];  Service: Orthopedics;  Laterality: Left;   CESAREAN SECTION     CHOLECYSTECTOMY  2017   LAPAROSCOPIC   COLONSCOPY  LAST DONE 2019   DILATATION & CURETTAGE/HYSTEROSCOPY WITH MYOSURE N/A 05/20/2020   Procedure: DILATATION & CURETTAGE/HYSTEROSCOPY;  Surgeon: 05/22/2020, MD;  Location: [facility];  Service: Gynecology;  Laterality: N/A;   DILATION AND CURETTAGE OF UTERUS     X 1   EYE SURGERY  AGE 51   TRIGGER FINGER RELEASE Right 12/09/2017   Procedure: RELEASE TRIGGER FINGER/A-1 PULLEY;  Surgeon: 14/09/2017, MD;  Location: [facility];  Service: Orthopedics;   Laterality: Right;    Current Outpatient Medications on File Prior to Visit  Medication Sig Dispense Refill   rosuvastatin (CRESTOR) 20 MG tablet TAKE 1 TABLET BY MOUTH EVERY DAY 90 tablet 3   amLODipine (NORVASC) 2.5 MG tablet daily.     aspirin 81 MG tablet Take 81 mg by mouth daily.     atenolol (TENORMIN) 50 MG tablet Take 50 mg by mouth daily.      BIKTARVY 50-200-25 MG TABS tablet TAKE 1 TABLET BY MOUTH EVERY DAY 30 tablet 2   CALCIUM PO Take by mouth every evening.     Cholecalciferol (VITAMIN D PO) Take by mouth every evening.     clonazePAM (KLONOPIN) 0.5 MG tablet Take 0.5 mg by mouth every evening.     EMGALITY 120 MG/ML SOAJ Inject into the skin every 30 (thirty) days.     estradiol (ESTRACE) 2 MG tablet Take 2 mg by mouth daily.     naproxen (NAPROSYN) 500 MG tablet Take 500 mg by mouth 2 (two) times daily as needed.     NON FORMULARY 75 mg every evening. Pregnenolone     nortriptyline (PAMELOR) 50 MG capsule Take 50 mg by mouth daily.  Probiotic Product (ALIGN PO) Take 1 capsule by mouth 2 (two) times daily. PM AND QHS  PROBIOTIC 25 BILLION PER STRAIN     progesterone (PROMETRIUM) 200 MG capsule Take 200 mg by mouth at bedtime.     rizatriptan (MAXALT) 10 MG tablet Take 10 mg by mouth as needed for migraine. May repeat in 2 hours if needed     sulfamethoxazole-trimethoprim (BACTRIM) 400-80 MG tablet TAKE 1 TABLET BY MOUTH EVERY DAY 30 tablet 3   Current Facility-Administered Medications on File Prior to Visit  Medication Dose Route Frequency Provider Last Rate Last Admin   albuterol (VENTOLIN HFA) 108 (90 Base) MCG/ACT inhaler 2 puff  2 puff Inhalation Once PRN Blanchard Kelchixon, Stephanie N, NP       diphenhydrAMINE (BENADRYL) injection 50 mg  50 mg Intramuscular Once PRN Blanchard Kelchixon, Stephanie N, NP       EPINEPHrine (EPI-PEN) injection 0.3 mg  0.3 mg Intramuscular Once PRN Blanchard Kelchixon, Stephanie N, NP       methylPREDNISolone sodium succinate (SOLU-MEDROL) 125 mg/2 mL injection 125 mg  125 mg  Intramuscular Once PRN Blanchard Kelchixon, Stephanie N, NP        Allergies  Allergen Reactions   Dust Mite Extract Other (See Comments)   Mold Extract  [Trichophyton] Other (See Comments)   Trazodone Hcl Other (See Comments)    NOT SURE REACTION    Social History   Socioeconomic History   Marital status: Divorced    Spouse name: Not on file   Number of children: 5   Years of education: 16   Highest education level: Not on file  Occupational History   Not on file  Tobacco Use   Smoking status: Never   Smokeless tobacco: Never  Vaping Use   Vaping Use: Never used  Substance and Sexual Activity   Alcohol use: No   Drug use: No   Sexual activity: Not Currently    Comment: declined condoms  Other Topics Concern   Not on file  Social History Narrative   Lives with husband in a one story home.  Has 5 children (2 biological and 3 adopted).  Works as a Clinical research associatewriter.  Education: college.    Social Determinants of Health   Financial Resource Strain: Not on file  Food Insecurity: Not on file  Transportation Needs: Not on file  Physical Activity: Not on file  Stress: Not on file  Social Connections: Not on file  Intimate Partner Violence: Not on file    Family History  Problem Relation Age of Onset   Heart failure Mother    Cancer Father    CAD Father        MI in his 5750s   Heart attack Father    Prostate cancer Father [phone]   Colon cancer Father [id number]   CAD Sister        Died at age 61 of MI   Heart attack Sister    Hypertension Brother     The following portions of the patient's history were reviewed and updated as appropriate: allergies, current medications, past family history, past medical history, past social history, past surgical history and problem list.  Review of Systems Pertinent items noted in HPI and remainder of comprehensive ROS otherwise negative.   Objective:  BP 130/79    Pulse 81    Ht 5\' 5"  (1.651 m)    Wt 159 lb (72.1 kg)    LMP 09/24/2012    BMI 26.46 kg/m   CONSTITUTIONAL: Well-developed, well-nourished  female in no acute distress.  HENT:  Normocephalic, atraumatic, External right and left ear normal. Oropharynx is clear and moist EYES: Conjunctivae and EOM are normal. Pupils are equal, round, and reactive to light. No scleral icterus.  NECK: Normal range of motion, supple, no masses.  Normal thyroid.  SKIN: Skin is warm and dry. No rash noted. Not diaphoretic. No erythema. No pallor. NEUROLGIC: Alert and oriented to person, place, and time. Normal reflexes, muscle tone coordination. No cranial nerve deficit noted. PSYCHIATRIC: Normal mood and affect. Normal behavior. Normal judgment and thought content. CARDIOVASCULAR: Normal heart rate noted, regular rhythm RESPIRATORY: Clear to auscultation bilaterally. Effort and breath sounds normal, no problems with respiration noted. BREASTS: Symmetric in size. No masses, skin changes, nipple drainage, or lymphadenopathy. ABDOMEN: Soft, normal bowel sounds, no distention noted.  No tenderness, rebound or guarding.  PELVIC: Normal appearing external genitalia; atrophic appearing vaginal mucosa and cervix.  No abnormal discharge noted.  Pap smear obtained.  Normal uterine size, no other palpable masses, no uterine or adnexal tenderness. MUSCULOSKELETAL: Normal range of motion. No tenderness.  No cyanosis, clubbing, or edema.  2+ distal pulses.   Assessment:  Annual gynecologic examination with pap smear  Plan:  Will follow up results of pap smear and manage accordingly. Mammogram scheduled Routine preventative health maintenance measures emphasized. Please refer to After Visit Summary for other counseling recommendations.    Hermina Staggers, MD, FACOG Attending Obstetrician & Gynecologist Center for [facility], [facility]

## 2021-02-01 NOTE — Progress Notes (Signed)
NEW GYN presents for AEX/PAP.   Last PAP 07/18/2020 and gets her PAP every 6 months. Last Mammogram 04/04/2020.

## 2021-02-01 NOTE — Patient Instructions (Signed)

## 2021-02-02 ENCOUNTER — Other Ambulatory Visit: Payer: Self-pay

## 2021-02-02 ENCOUNTER — Other Ambulatory Visit: Payer: 59

## 2021-02-02 DIAGNOSIS — B20 Human immunodeficiency virus [HIV] disease: Secondary | ICD-10-CM

## 2021-02-03 LAB — CYTOLOGY - PAP
Comment: NEGATIVE
Diagnosis: UNDETERMINED — AB
High risk HPV: NEGATIVE

## 2021-02-04 ENCOUNTER — Encounter: Payer: Self-pay | Admitting: Cardiology

## 2021-02-04 DIAGNOSIS — E7800 Pure hypercholesterolemia, unspecified: Secondary | ICD-10-CM

## 2021-02-05 DIAGNOSIS — B20 Human immunodeficiency virus [HIV] disease: Secondary | ICD-10-CM

## 2021-02-06 ENCOUNTER — Other Ambulatory Visit (HOSPITAL_COMMUNITY): Payer: Self-pay

## 2021-02-06 ENCOUNTER — Other Ambulatory Visit: Payer: Self-pay

## 2021-02-06 DIAGNOSIS — B20 Human immunodeficiency virus [HIV] disease: Secondary | ICD-10-CM

## 2021-02-06 LAB — COMPLETE METABOLIC PANEL WITH GFR
AG Ratio: 2 (calc) (ref 1.0–2.5)
ALT: 18 U/L (ref 6–29)
AST: 19 U/L (ref 10–35)
Albumin: 4.3 g/dL (ref 3.6–5.1)
Alkaline phosphatase (APISO): 52 U/L (ref 37–153)
BUN: 18 mg/dL (ref 7–25)
CO2: 29 mmol/L (ref 20–32)
Calcium: 9.3 mg/dL (ref 8.6–10.4)
Chloride: 104 mmol/L (ref 98–110)
Creat: 1.01 mg/dL (ref 0.50–1.05)
Globulin: 2.2 g/dL (calc) (ref 1.9–3.7)
Glucose, Bld: 96 mg/dL (ref 65–99)
Potassium: 4.3 mmol/L (ref 3.5–5.3)
Sodium: 139 mmol/L (ref 135–146)
Total Bilirubin: 0.3 mg/dL (ref 0.2–1.2)
Total Protein: 6.5 g/dL (ref 6.1–8.1)
eGFR: 64 mL/min/{1.73_m2} (ref >=60)

## 2021-02-06 LAB — T-HELPER CELLS (CD4) COUNT (NOT AT ARMC)
Absolute CD4: 122 cells/uL — ABNORMAL LOW (ref 490–1740)
CD4 T Helper %: 14 % — ABNORMAL LOW (ref 30–61)
Total lymphocyte count: 885 cells/uL (ref 850–3900)

## 2021-02-06 LAB — HIV-1 RNA QUANT-NO REFLEX-BLD
HIV 1 RNA Quant: 25 Copies/mL — ABNORMAL HIGH
HIV-1 RNA Quant, Log: 1.39 Log cps/mL — ABNORMAL HIGH

## 2021-02-06 MED ORDER — FOSTEMSAVIR TROMETHAMINE ER 600 MG PO TB12
600.0000 mg | ORAL_TABLET | Freq: Two times a day (BID) | ORAL | 11 refills | Status: DC
  Filled 2021-02-06 (×2): qty 60, 30d supply, fill #0
  Filled ????-??-??: fill #1

## 2021-02-06 MED ORDER — ROSUVASTATIN CALCIUM 10 MG PO TABS: 10.0000 mg | ORAL_TABLET | Freq: Every day | ORAL | 1 refills | Status: DC

## 2021-02-06 MED ORDER — BIKTARVY 50-200-25 MG PO TABS: 1.0000 | ORAL_TABLET | Freq: Every day | ORAL | 2 refills | Status: DC

## 2021-02-06 NOTE — Addendum Note (Signed)
Addended by: Blanchard Kelch on: 02/06/2021 11:40 AM   Modules accepted: Orders

## 2021-02-10 ENCOUNTER — Encounter: Payer: Self-pay | Admitting: Obstetrics and Gynecology

## 2021-02-10 DIAGNOSIS — R87610 Atypical squamous cells of undetermined significance on cytologic smear of cervix (ASC-US): Secondary | ICD-10-CM | POA: Insufficient documentation

## 2021-02-13 ENCOUNTER — Ambulatory Visit: Payer: 59 | Admitting: Infectious Diseases

## 2021-02-13 ENCOUNTER — Other Ambulatory Visit: Payer: Self-pay

## 2021-02-13 ENCOUNTER — Encounter: Payer: Self-pay | Admitting: Infectious Diseases

## 2021-02-13 DIAGNOSIS — R5383 Other fatigue: Secondary | ICD-10-CM | POA: Diagnosis not present

## 2021-02-13 DIAGNOSIS — B20 Human immunodeficiency virus [HIV] disease: Secondary | ICD-10-CM | POA: Diagnosis not present

## 2021-02-13 NOTE — Patient Instructions (Addendum)
Lovely to see you as always!  Continue your bactrim once a day, biktarvy once a day and rukobia twice a day.   Will plan a lab only visit in 3 months and in person again in 6.   Hard to Shannan Harper - book by Jasper Riling on me to read list!  PARTNER and PARTNER 2 study for U=U

## 2021-02-13 NOTE — Progress Notes (Signed)
lp     Patient: Stacy Watkins  DOB: 11-19-1960 MRN: [id number] PCP: Carol Ada, MD     Subjective:  Brief Narrative: Stacy Watkins is a 61 y.o. female with HIV, AIDS+ Dx 05/2019 CD4 nadir < 35 HIV Risk: sexual OI: oral thrush Intake Labs: Hep B sAg (-), sAb (-), cAb (-), Hep A Ab (-), Hep C Ab (-) Quantiferon (-) HLA B*5701 (pending)  Previous Regimens: Biktarvy >> suppressed  Rukobia added 09/2020 to try to get CD4 up  Genotype:  06-2019 - wildtype   Chief Complaint  Patient presents with   Follow-up      HPI: More good days than bad - started on Wellbutrin from her PCP. Stopped the naltrexone and several other supplements as it was getting quite expensive and no benefit from them. She does see them for hormone support which is important to her. Feels she has had more energy on the Wellbutrin and has noticed if she does too much this will trigger a bad day for her (specifically working too much, too many appts, etc).   Doing well with the [place] added twice a day to help increase her immune system. No side effects from what she has noticed. Continues biktarvy everyday as well.   GYN follow up yesterday with pap smear completed --> ASCUS with negative HPV     Review of Systems  Constitutional:  Positive for fatigue. Negative for chills, fever and unexpected weight change.  HENT:  Negative for mouth sores.   Respiratory:  Negative for shortness of breath.   Cardiovascular:  Negative for chest pain.  Gastrointestinal:  Negative for abdominal pain, constipation and diarrhea.  Genitourinary:  Negative for menstrual problem and pelvic pain.  Musculoskeletal:  Negative for joint swelling.  Skin:  Negative for color change and rash.  Neurological:  Negative for dizziness.  Psychiatric/Behavioral:  The patient is not nervous/anxious.    Past Medical History:  Diagnosis Date   Anxiety    Depression    Depression with anxiety    HIV disease ([place]) 06/08/2019    Hyperlipidemia    Hypertension    Migraine    PMB (postmenopausal bleeding)    Seasonal allergies    Situational stress    Vaginal Pap smear, abnormal    Wears glasses     Outpatient Medications Prior to Visit  Medication Sig Dispense Refill   amLODipine (NORVASC) 2.5 MG tablet daily.     aspirin 81 MG tablet Take 81 mg by mouth daily.     atenolol (TENORMIN) 50 MG tablet Take 50 mg by mouth daily.      bictegravir-emtricitabine-tenofovir AF (BIKTARVY) 50-200-25 MG TABS tablet Take 1 tablet by mouth daily. 30 tablet 2   buPROPion ER (WELLBUTRIN SR) 100 MG 12 hr tablet 1 tablet in the morning     CALCIUM PO Take by mouth every evening.     Cholecalciferol (VITAMIN D PO) Take by mouth every evening.     clonazePAM (KLONOPIN) 0.5 MG tablet Take 0.5 mg by mouth every evening.     EMGALITY 120 MG/ML SOAJ Inject into the skin every 30 (thirty) days.     estradiol (ESTRACE) 2 MG tablet Take 2 mg by mouth daily.     fostemsavir tromethamine (RUKOBIA) 600 MG TB12 ER tablet Take 1 tablet (600 mg total) by mouth 2 (two) times daily. 60 tablet 11   naproxen (NAPROSYN) 500 MG tablet Take 500 mg by mouth 2 (two) times daily as needed.  NON FORMULARY 75 mg every evening. Pregnenolone     nortriptyline (PAMELOR) 50 MG capsule Take 50 mg by mouth daily.     Probiotic Product (ALIGN PO) Take 1 capsule by mouth 2 (two) times daily. PM AND QHS  PROBIOTIC 25 BILLION PER STRAIN     progesterone (PROMETRIUM) 200 MG capsule Take 200 mg by mouth at bedtime.     rizatriptan (MAXALT) 10 MG tablet Take 10 mg by mouth as needed for migraine. May repeat in 2 hours if needed     rosuvastatin (CRESTOR) 10 MG tablet Take 1 tablet (10 mg total) by mouth daily. 90 tablet 1   sulfamethoxazole-trimethoprim (BACTRIM) 400-80 MG tablet TAKE 1 TABLET BY MOUTH EVERY DAY 30 tablet 3   Facility-Administered Medications Prior to Visit  Medication Dose Route Frequency Provider Last Rate Last Admin   albuterol (VENTOLIN HFA)  108 (90 Base) MCG/ACT inhaler 2 puff  2 puff Inhalation Once PRN [place] Callas, NP       diphenhydrAMINE (BENADRYL) injection 50 mg  50 mg Intramuscular Once PRN [place] Callas, NP       EPINEPHrine (EPI-PEN) injection 0.3 mg  0.3 mg Intramuscular Once PRN [place]de Callas, NP       methylPREDNISolone sodium succinate (SOLU-MEDROL) 125 mg/2 mL injection 125 mg  125 mg Intramuscular Once PRN [place] Callas, NP         Allergies  Allergen Reactions   Dust Mite Extract Other (See Comments)   Mold Extract  [Trichophyton] Other (See Comments)   Trazodone Hcl Other (See Comments)    NOT SURE REACTION    Social History   Tobacco Use   Smoking status: Never   Smokeless tobacco: Never  Vaping Use   Vaping Use: Never used  Substance Use Topics   Alcohol use: No   Drug use: No     Objective:   Vitals:   02/13/21 0956  BP: 112/73  Pulse: 86  Temp: 98.4 F (36.9 C)  TempSrc: Temporal  Weight: 159 lb (72.1 kg)   Body mass index is 26.46 kg/m.   Physical Exam Vitals reviewed.  Constitutional:      Appearance: Normal appearance. She is not ill-appearing.  HENT:     Mouth/Throat:     Mouth: Mucous membranes are moist.     Pharynx: Oropharynx is clear.  Eyes:     General: No scleral icterus. Cardiovascular:     Rate and Rhythm: Normal rate.  Pulmonary:     Effort: Pulmonary effort is normal.  Neurological:     Mental Status: She is oriented to person, place, and time.  Psychiatric:        Mood and Affect: Mood normal.        Thought Content: Thought content normal.     Lab Results: Lab Results  Component Value Date   WBC 3.6 (L) 05/25/2020   HGB 14.7 05/25/2020   HCT 42.1 05/25/2020   MCV 91.9 05/25/2020   PLT 200 05/25/2020    Lab Results  Component Value Date   CREATININE 1.01 02/02/2021   BUN 18 02/02/2021   NA 139 02/02/2021   K 4.3 02/02/2021   CL 104 02/02/2021   CO2 29 02/02/2021    Lab Results  Component Value Date   ALT 18  02/02/2021   AST 19 02/02/2021   BILITOT 0.3 02/02/2021     Assessment & Plan:   Problem List Items Addressed This Visit  Unprioritized   HIV disease ([place])    Doing well on once daily BIKTARVY and twice daily RUKOBIA for managmenet. Viral load remains in the undetectable range at only 25 copies. Discussed U=U concept in addition to safe sex counseling and prevention of other STIs.  She is considering dating again. Planning on going to [facility] sponsored women's support group and considering working towards some advocacy work for [employer] and sharing her story.  RTC for labs in 3 months and a visit again in 6.  Would like to give her Prevnar 20 for pneumococcal vaccine at upcoming visit.       Relevant Orders   T-helper cells (CD4) count (not at [facility])   HIV-1 RNA quant-no reflex-bld   AIDS (acquired immune deficiency syndrome) ([place])    Continue bactrim once daily for now. Hopeful immune system will continue to show recovery!      Fatigue    Seems to be slowly improving due to time, addition of Rukobia to improve CD4 or addition of Wellbutrin. I am happy to see this is moving in the right direction for her.      Return in about 6 months (around 08/13/2021).    Janene Madeira, MSN, NP-C [facility] [place] Pager: [phone] Office: [phone]

## 2021-02-13 NOTE — Assessment & Plan Note (Signed)
Doing well on once daily BIKTARVY and twice daily RUKOBIA for managmenet. Viral load remains in the undetectable range at only 25 copies. Discussed U=U concept in addition to safe sex counseling and prevention of other STIs.  She is considering dating again. Planning on going to [facility] sponsored women's support group and considering working towards some advocacy work for [employer] and sharing her story.  RTC for labs in 3 months and a visit again in 6.  Would like to give her Prevnar 20 for pneumococcal vaccine at upcoming visit.

## 2021-02-13 NOTE — Assessment & Plan Note (Signed)
Seems to be slowly improving due to time, addition of Rukobia to improve CD4 or addition of Wellbutrin. I am happy to see this is moving in the right direction for her.

## 2021-02-13 NOTE — Assessment & Plan Note (Signed)
Continue bactrim once daily for now. Hopeful immune system will continue to show recovery!

## 2021-02-16 ENCOUNTER — Other Ambulatory Visit (HOSPITAL_COMMUNITY): Payer: Self-pay

## 2021-02-16 ENCOUNTER — Other Ambulatory Visit: Payer: Self-pay

## 2021-02-16 ENCOUNTER — Other Ambulatory Visit: Payer: Self-pay | Admitting: Infectious Diseases

## 2021-02-16 DIAGNOSIS — B20 Human immunodeficiency virus [HIV] disease: Secondary | ICD-10-CM

## 2021-02-16 MED ORDER — BIKTARVY 50-200-25 MG PO TABS
1.0000 | ORAL_TABLET | Freq: Every day | ORAL | 4 refills | Status: DC
  Filled 2021-02-16: qty 30, 30d supply, fill #0

## 2021-02-27 ENCOUNTER — Other Ambulatory Visit (HOSPITAL_COMMUNITY): Payer: Self-pay

## 2021-02-28 ENCOUNTER — Other Ambulatory Visit (HOSPITAL_COMMUNITY): Payer: Self-pay

## 2021-02-28 ENCOUNTER — Other Ambulatory Visit: Payer: Self-pay | Admitting: Infectious Diseases

## 2021-02-28 DIAGNOSIS — B20 Human immunodeficiency virus [HIV] disease: Secondary | ICD-10-CM

## 2021-02-28 MED ORDER — BIKTARVY 50-200-25 MG PO TABS: 1.0000 | ORAL_TABLET | Freq: Every day | ORAL | 4 refills | Status: DC

## 2021-02-28 NOTE — Telephone Encounter (Signed)
Canceled Biktarvy at [facility] and sent to [facility] per patient request.   Sandie Ano, RN

## 2021-03-01 ENCOUNTER — Other Ambulatory Visit (HOSPITAL_COMMUNITY): Payer: Self-pay

## 2021-03-01 MED ORDER — FOSTEMSAVIR TROMETHAMINE ER 600 MG PO TB12: 600.0000 mg | ORAL_TABLET | Freq: Two times a day (BID) | ORAL | 4 refills | Status: DC

## 2021-03-01 NOTE — Addendum Note (Signed)
Addended by: Linna Hoff D on: 03/01/2021 08:45 AM ? ? Modules accepted: Orders ? ?

## 2021-03-01 NOTE — Telephone Encounter (Signed)
It should be the same. As long as [facility] is in her network.Marland KitchenMarland Kitchen

## 2021-03-09 ENCOUNTER — Other Ambulatory Visit: Payer: Self-pay | Admitting: Obstetrics and Gynecology

## 2021-03-09 DIAGNOSIS — Z1231 Encounter for screening mammogram for malignant neoplasm of breast: Secondary | ICD-10-CM

## 2021-03-13 DIAGNOSIS — R1310 Dysphagia, unspecified: Secondary | ICD-10-CM

## 2021-03-13 DIAGNOSIS — R5383 Other fatigue: Secondary | ICD-10-CM

## 2021-03-13 DIAGNOSIS — R09A2 Foreign body sensation, throat: Secondary | ICD-10-CM

## 2021-03-13 DIAGNOSIS — R198 Other specified symptoms and signs involving the digestive system and abdomen: Secondary | ICD-10-CM

## 2021-03-13 DIAGNOSIS — Z800 Family history of malignant neoplasm of digestive organs: Secondary | ICD-10-CM

## 2021-03-13 DIAGNOSIS — R0989 Other specified symptoms and signs involving the circulatory and respiratory systems: Secondary | ICD-10-CM

## 2021-03-14 MED ORDER — MODAFINIL 100 MG PO TABS: ORAL_TABLET | ORAL | 0 refills | Status: DC

## 2021-03-23 ENCOUNTER — Other Ambulatory Visit: Payer: Self-pay | Admitting: Infectious Diseases

## 2021-03-23 ENCOUNTER — Encounter: Payer: Self-pay | Admitting: Gastroenterology

## 2021-03-23 MED ORDER — DOVATO 50-300 MG PO TABS: 1.0000 | ORAL_TABLET | Freq: Every day | ORAL | 11 refills | Status: DC

## 2021-03-23 NOTE — Progress Notes (Signed)
Switching from biktarvy to dovato d/t possible weight gain side effect.  ? ?Counseled to continue taking Rukobia BID with Dovato once a day.  ?

## 2021-04-18 ENCOUNTER — Encounter: Payer: Self-pay | Admitting: Obstetrics and Gynecology

## 2021-04-25 ENCOUNTER — Other Ambulatory Visit: Payer: Self-pay

## 2021-04-25 DIAGNOSIS — B20 Human immunodeficiency virus [HIV] disease: Secondary | ICD-10-CM

## 2021-05-01 ENCOUNTER — Encounter: Payer: Self-pay | Admitting: Obstetrics and Gynecology

## 2021-05-02 ENCOUNTER — Other Ambulatory Visit: Payer: 59

## 2021-05-03 ENCOUNTER — Ambulatory Visit (INDEPENDENT_AMBULATORY_CARE_PROVIDER_SITE_OTHER): Payer: 59 | Admitting: Obstetrics and Gynecology

## 2021-05-03 ENCOUNTER — Encounter: Payer: Self-pay | Admitting: Obstetrics and Gynecology

## 2021-05-03 DIAGNOSIS — A630 Anogenital (venereal) warts: Secondary | ICD-10-CM | POA: Insufficient documentation

## 2021-05-03 NOTE — Progress Notes (Signed)
Pt is in the office reporting possible warts that appeared not long after last visit. Pt states that she is scheduled to see a surgeon to freeze them off. ?

## 2021-05-03 NOTE — Progress Notes (Signed)
Ms Kontz presents with c/o genital warts. Noted a few weeks ago. ?Has some rectal as well and has seen general surgery for cryotherapy next week. ?Reports her CD4 counts have decreased. Continues to see ID ? ? ?PE AF VSS ?Chaperone present during exam ? ?Lungs clear Heart RRR ?Abd soft + BS ?GU small genital warts note on right labial fold. Treated with TCA. Pt tolerated well. ? ? ?A/P Genital warts ? ?S/P TCA application. F/U in 4 weeks  ?

## 2021-05-04 ENCOUNTER — Encounter: Payer: Self-pay | Admitting: Infectious Diseases

## 2021-05-04 ENCOUNTER — Ambulatory Visit (INDEPENDENT_AMBULATORY_CARE_PROVIDER_SITE_OTHER): Payer: 59 | Admitting: Infectious Diseases

## 2021-05-04 ENCOUNTER — Other Ambulatory Visit: Payer: Self-pay

## 2021-05-04 VITALS — BP 130/80 | HR 86 | Temp 98.2°F | Wt 160.0 lb

## 2021-05-04 DIAGNOSIS — R87619 Unspecified abnormal cytological findings in specimens from cervix uteri: Secondary | ICD-10-CM

## 2021-05-04 DIAGNOSIS — G44059 Short lasting unilateral neuralgiform headache with conjunctival injection and tearing (SUNCT), not intractable: Secondary | ICD-10-CM

## 2021-05-04 DIAGNOSIS — B20 Human immunodeficiency virus [HIV] disease: Secondary | ICD-10-CM

## 2021-05-04 DIAGNOSIS — R413 Other amnesia: Secondary | ICD-10-CM | POA: Diagnosis not present

## 2021-05-04 DIAGNOSIS — R5383 Other fatigue: Secondary | ICD-10-CM | POA: Diagnosis not present

## 2021-05-04 DIAGNOSIS — K1379 Other lesions of oral mucosa: Secondary | ICD-10-CM | POA: Insufficient documentation

## 2021-05-04 MED ORDER — DIAZEPAM 5 MG PO TABS: 5.0000 mg | ORAL_TABLET | Freq: Four times a day (QID) | ORAL | 0 refills | Status: DC | PRN

## 2021-05-04 NOTE — Patient Instructions (Addendum)
Can stop the [place] to see if this has anything to do with some of your symptoms.  ? ?Would also consider holding your statin for a 2 week period to see if this makes any impact. Even though it is a baby dose, some people don't tolerate the statin well and wind up having to take it every other day to combat the muscle aches / fatigue side effects that can come with them.  ? ?Would be reasonable to retrial the modafanil again  ? ?Will be curious to see what the GI doctor finds - I think you need an EGD to try to figure out what's going on with the ulcers and stomach symptoms.  ? ?Will try to find you an open MRI to see if we can get that arranged with your insurance.  ? ?Plan to come back to see me anytime between 3-6 months  ? ? ? ?

## 2021-05-04 NOTE — Assessment & Plan Note (Addendum)
Continue Dovato and bactrim - she would like to stop the [place] out of consideration potentially some symptoms could be related to s/e from this (though would not explain the fevers...). I think given it is an adjuvant to see if we can recruit back some immune function for her she can trial a stopping period of this. Cell lines from WBC done recently do appear a little improved though absolute CD4 remains < 200.  ? ?Low grade fevers concerning paired with new headache features, and possible neuromuscular weakness. Will arrange MRI of brain.  ? ?RTC in 3-69m ?

## 2021-05-04 NOTE — Assessment & Plan Note (Signed)
FU with GYN with cryotherapy intervention on external vulva recently.  ?

## 2021-05-04 NOTE — Progress Notes (Signed)
lp  ? ?  ?Patient: Stacy Watkins  ?DOB: 1960/03/10 ?MRN: [id number] ?PCP: Carol Ada, MD  ? ? ? ?Subjective:  ?Brief Narrative: ?CAMI DELAWDER is a 60 y.o. female with HIV, AIDS+ Dx 05/2019 ?CD4 nadir < 35 ?HIV Risk: sexual ?OI: oral thrush ?Intake Labs: ?Hep B sAg (-), sAb (-), cAb (-), Hep A Ab (-), Hep C Ab (-) ?Quantiferon (-) ?HLA B*5701 (pending) ? ?Previous Regimens: ?Biktarvy >> suppressed  ?Rukobia added 09/2020 to try to get CD4 up ?Dovato  ? ?Genotype:  ?06-2019 - wildtype ? ? ? ? ?CC: ?New symptoms she has noticed with her extreme/exhaustion.  ? ? ? ?HPI: ?Stacy Watkins is here for follow up -  ?Continues on [place] for HIV treatment. Had repeat CD4 recently at [facility] and dropped a little to 110 with VL < 20.  ? ?New symptoms - has had some weakness in the lower extremities; describes a similar episode a few years back that prompted Neurology work up which was all negative at that time. Feels more muscle weakness vs nerve related. Occasionally with upper body heaviness/weakness. She has not been exercising as much as she had in the past due to the extreme exhaustion.  ?Intermittent low grade fevers at night usually but feels like a warmth sensation, 99.5-100.5. Comes randomly and goes without any intervention.  ?Body aches - feels she has more joint pains. With the slightest effort she has physical exhaustion. Stomach discomfort since mid March off and on. No affiliation with food intake or not. At first felt that it was due to modafinil s/e but this has persisted after the short time she tried this medication.  Feels like a fullness but not necessarily pain. No changes in bowel movements but has added some fiber once a day. Still with recurrent oral ulcers.  ?Mild headaches that are different than her typical migraines- she continues on Mgality injections for migraines.  ? ?GYN and anorectal surgery follow up with cryotherapy interventions.  ?  ? ?Review of Systems  ?Constitutional:  Positive  for activity change, fatigue and fever. Negative for appetite change, chills and unexpected weight change.  ?HENT:  Positive for mouth sores and trouble swallowing. Negative for voice change.   ?Eyes:  Negative for visual disturbance.  ?Respiratory:  Negative for cough and shortness of breath.   ?Cardiovascular:  Negative for chest pain.  ?Gastrointestinal:  Positive for abdominal pain. Negative for constipation, diarrhea, nausea and vomiting.  ?Genitourinary:  Negative for difficulty urinating, menstrual problem and pelvic pain.  ?Musculoskeletal:  Positive for myalgias. Negative for joint swelling and neck pain.  ?Skin:  Negative for color change and rash.  ?Neurological:  Positive for weakness and headaches. Negative for dizziness.  ?Hematological:  Negative for adenopathy.  ?Psychiatric/Behavioral:  The patient is not nervous/anxious.   ? ?Past Medical History:  ?Diagnosis Date  ? Anxiety   ? Depression   ? Depression with anxiety   ? HIV disease ([place]) 06/08/2019  ? Hyperlipidemia   ? Hypertension   ? Migraine   ? PMB (postmenopausal bleeding)   ? Seasonal allergies   ? Situational stress   ? Vaginal Pap smear, abnormal   ? Wears glasses   ? ? ?Outpatient Medications Prior to Visit  ?Medication Sig Dispense Refill  ? amLODipine (NORVASC) 2.5 MG tablet daily.    ? aspirin 81 MG tablet Take 81 mg by mouth daily.    ? atenolol (TENORMIN) 50 MG tablet Take 50 mg by mouth daily.     ?  buPROPion ER (WELLBUTRIN SR) 100 MG 12 hr tablet 1 tablet in the morning    ? CALCIUM PO Take by mouth every evening.    ? Cholecalciferol (VITAMIN D PO) Take by mouth every evening.    ? clonazePAM (KLONOPIN) 0.5 MG tablet Take 0.5 mg by mouth every evening.    ? dolutegravir-lamiVUDine (DOVATO) 50-300 MG tablet Take 1 tablet by mouth daily. 30 tablet 11  ? EMGALITY 120 MG/ML SOAJ Inject into the skin every 30 (thirty) days.    ? estradiol (ESTRACE) 2 MG tablet Take 2 mg by mouth daily.    ? fostemsavir tromethamine (RUKOBIA) 600 MG  TB12 ER tablet Take 1 tablet (600 mg total) by mouth 2 (two) times daily. 60 tablet 4  ? naproxen (NAPROSYN) 500 MG tablet Take 500 mg by mouth 2 (two) times daily as needed.    ? NON FORMULARY 75 mg every evening. Pregnenolone    ? nortriptyline (PAMELOR) 50 MG capsule Take 50 mg by mouth daily.    ? Probiotic Product (ALIGN PO) Take 1 capsule by mouth 2 (two) times daily. PM AND QHS  PROBIOTIC 25 BILLION PER STRAIN    ? progesterone (PROMETRIUM) 200 MG capsule Take 200 mg by mouth at bedtime.    ? rizatriptan (MAXALT) 10 MG tablet Take 10 mg by mouth as needed for migraine. May repeat in 2 hours if needed    ? rosuvastatin (CRESTOR) 10 MG tablet Take 1 tablet (10 mg total) by mouth daily. 90 tablet 1  ? sulfamethoxazole-trimethoprim (BACTRIM) 400-80 MG tablet TAKE 1 TABLET BY MOUTH EVERY DAY 30 tablet 3  ? modafinil (PROVIGIL) 100 MG tablet Take 1 tablet (100 mg total) by mouth daily for 3 days, THEN 2 tablets (200 mg total) daily for 27 days. 57 tablet 0  ? ?Facility-Administered Medications Prior to Visit  ?Medication Dose Route Frequency Provider Last Rate Last Admin  ? albuterol (VENTOLIN HFA) 108 (90 Base) MCG/ACT inhaler 2 puff  2 puff Inhalation Once PRN [place] Callas, NP      ? diphenhydrAMINE (BENADRYL) injection 50 mg  50 mg Intramuscular Once PRN [place] Callas, NP      ? EPINEPHrine (EPI-PEN) injection 0.3 mg  0.3 mg Intramuscular Once PRN [place] Callas, NP      ? methylPREDNISolone sodium succinate (SOLU-MEDROL) 125 mg/2 mL injection 125 mg  125 mg Intramuscular Once PRN  Callas, NP      ?  ? ?Allergies  ?Allergen Reactions  ? Dust Mite Extract Other (See Comments)  ? Mold Extract  [Trichophyton] Other (See Comments)  ? Trazodone Hcl Other (See Comments)  ?  NOT SURE REACTION  ? ? ?Social History  ? ?Tobacco Use  ? Smoking status: Never  ? Smokeless tobacco: Never  ?Vaping Use  ? Vaping Use: Never used  ?Substance Use Topics  ? Alcohol use: No  ? Drug use: No   ? ? ? ?Objective:  ? ?Vitals:  ? 05/04/21 1108  ?BP: 130/80  ?Pulse: 86  ?Temp: 98.2 ?F (36.8 ?C)  ?TempSrc: Oral  ?Weight: 160 lb (72.6 kg)  ? ?Body mass index is 26.63 kg/m?. ? ? ?Physical Exam ?Vitals reviewed.  ?Constitutional:   ?   Appearance: Normal appearance. She is not ill-appearing.  ?HENT:  ?   Mouth/Throat:  ?   Mouth: Mucous membranes are moist.  ?   Pharynx: Oropharynx is clear.  ?Eyes:  ?   General: No scleral icterus. ?Cardiovascular:  ?  Rate and Rhythm: Normal rate.  ?Pulmonary:  ?   Effort: Pulmonary effort is normal.  ?Neurological:  ?   Mental Status: She is oriented to person, place, and time.  ?Psychiatric:     ?   Mood and Affect: Mood normal.     ?   Thought Content: Thought content normal.  ? ? ? ?Lab Results: ?Lab Results  ?Component Value Date  ? WBC 3.6 (L) 05/25/2020  ? HGB 14.7 05/25/2020  ? HCT 42.1 05/25/2020  ? MCV 91.9 05/25/2020  ? PLT 200 05/25/2020  ?  ?Lab Results  ?Component Value Date  ? CREATININE 1.01 02/02/2021  ? BUN 18 02/02/2021  ? NA 139 02/02/2021  ? K 4.3 02/02/2021  ? CL 104 02/02/2021  ? CO2 29 02/02/2021  ?  ?Lab Results  ?Component Value Date  ? ALT 18 02/02/2021  ? AST 19 02/02/2021  ? BILITOT 0.3 02/02/2021  ?  ? ?Assessment & Plan:  ? ?Problem List Items Addressed This Visit   ? ?  ? Unprioritized  ? HIV disease ([place])  ? Relevant Orders  ? 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 Abnormal Pap smear of cervix  ?  FU with GYN with cryotherapy intervention on external vulva recently.  ? ?  ?  ? AIDS (acquired immune deficiency syndrome) ([place]) - Primary  ?  Continue Dovato and bactrim - she would like to stop the [place] out of consideration potentially some symptoms could be related to s/e from this (though would not explain the fevers...). I think given it is an adjuvant to see if we can recruit back some immune function for her she can trial a stopping period of this. Cell lines from WBC done recently do appear a little improved though absolute CD4 remains < 200.   ? ?Low grade fevers concerning paired with new headache features, and possible neuromuscular weakness. Will arrange MRI of brain.  ? ?RTC in 3-33m? ?  ?  ? Exhaustion  ?  Will stage some stopping of medications - Rukobia

## 2021-05-04 NOTE — Assessment & Plan Note (Signed)
Now paired with some new abdominal symptoms for 2 months now. Describes some upper abdominal fullness/alterations in appetite. Initially though due to modafinil s/e but this has persisted. With low grade fevers and recurrent ulcers would consider possible CMV esophagitis here. She has a visit with GI at the end of the week - anticipate EGD and biopsies if indicated to explore further.  ?

## 2021-05-04 NOTE — Assessment & Plan Note (Signed)
Will stage some stopping of medications - Rukobia to stop first x 2 weeks then would suggest a trial hold of statin to see if this helps given concern over muscle aches and fatigue. Med s/e however would not explain fevers unless they are unrelated to each other.  ? ?Reasonable to re-trial the modafinil to see if this helps as I suspect her stomach symptoms could be related to undiagnosed problem currently (H pylori infection, viral esophagitis/gastritis or maybe SIBO).  ? ?Out of curiosity will check EBV PCR and CMV PCR at local [facility] to see if this gives any meaningful information.  ?

## 2021-05-04 NOTE — Assessment & Plan Note (Signed)
New onset headaches out of character from previous migraines, word searching / memory changes. Potentially relapsing neuro/muscular symptoms a/w lower extremities. I think given immunocompromised state reasonable to proceed with brain MRI to ensure no lesions / plaques noted - I do have a bit of a worry for evolving MS. May need to proceed with spinal MRI +/- LP to help arrive at further diagnosis.  ?

## 2021-05-07 ENCOUNTER — Other Ambulatory Visit: Payer: Self-pay | Admitting: Infectious Diseases

## 2021-05-07 DIAGNOSIS — B20 Human immunodeficiency virus [HIV] disease: Secondary | ICD-10-CM

## 2021-05-08 ENCOUNTER — Ambulatory Visit: Payer: 59 | Admitting: Infectious Diseases

## 2021-05-12 ENCOUNTER — Ambulatory Visit: Payer: 59 | Admitting: Gastroenterology

## 2021-05-12 ENCOUNTER — Encounter: Payer: Self-pay | Admitting: Gastroenterology

## 2021-05-12 VITALS — BP 100/72 | HR 90 | Ht 65.0 in | Wt 159.0 lb

## 2021-05-12 DIAGNOSIS — R1310 Dysphagia, unspecified: Secondary | ICD-10-CM | POA: Diagnosis not present

## 2021-05-12 MED ORDER — FLUCONAZOLE 100 MG PO TABS: 100.0000 mg | ORAL_TABLET | Freq: Every day | ORAL | 0 refills | Status: AC

## 2021-05-12 NOTE — Patient Instructions (Signed)
We have sent the following medications to your pharmacy for you to pick up at your convenience:Diflucan once daily x 14 days ? ? ?You have been scheduled for a Barium Esophogram at [facility] (1st floor of the hospital) on 05/22/21 at 11:00 am. Please arrive 30 minutes prior to your appointment for registration. Make certain not to have anything to eat or drink 3 hours prior to your test. If you need to reschedule for any reason, please contact radiology at [phone] to do so. ?___________________________________________________________ ?A barium swallow is an examination that concentrates on views of the esophagus. This tends to be a double contrast exam (barium and two liquids which, when combined, create a gas to distend the wall of the oesophagus) or single contrast (non-ionic iodine based). The study is usually tailored to your symptoms so a good history is essential. Attention is paid during the study to the form, structure and configuration of the esophagus, looking for functional disorders (such as aspiration, dysphagia, achalasia, motility and reflux) ?EXAMINATION ?You may be asked to change into a gown, depending on the type of swallow being performed. A radiologist and radiographer will perform the procedure. The radiologist will advise you of the ?type of contrast selected for your procedure and direct you during the exam. You will be asked to stand, sit or lie in several different positions and to hold a small amount of fluid in your mouth before being asked to swallow while the imaging is performed .In some instances you may be asked to swallow barium coated marshmallows to assess the motility of a solid food bolus. ?The exam can be recorded as a digital or video fluoroscopy procedure. ?POST PROCEDURE ?It will take 1-2 days for the barium to pass through your system. To facilitate this, it is important, unless otherwise directed, to increase your fluids for the next 24-48hrs and to resume  your normal diet.  ?This test typically takes about 30 minutes to perform. ?________________________________________________________________ ?The [place] [facility] providers would like to encourage you to use [facility] to communicate with providers for non-urgent requests or questions.  Due to long hold times on the telephone, sending your provider a message by [facility] may be a faster and more efficient way to get a response.  Please allow 48 business hours for a response.  Please remember that this is for non-urgent requests.  ? ?Due to recent changes in healthcare laws, you may see the results of your imaging and laboratory studies on MyChart before your provider has had a chance to review them.  We understand that in some cases there may be results that are confusing or concerning to you. Not all laboratory results come back in the same time frame and the provider may be waiting for multiple results in order to interpret others.  Please give [name] 48 hours in order for your provider to thoroughly review all the results before contacting the office for clarification of your results.  ? ?

## 2021-05-12 NOTE — Progress Notes (Signed)
? ?       ? ?SAMA ARAUZ    [id number]    December 19, 1960 ? ?Primary Care Physician:Smith, Candace, MD ? ?Referring Physician: Blanchard Kelch, NP ?[address] ?[place],  [place] [address] ? ? ?Chief complaint: Dysphagia, odynophagia ? ?HPI: ? ?61 year old female pleasant female diagnosed with HIV, AIDS in 2021 currently on treatment here for new patient visit with complaints of dysphagia and odynophagia. ?She has history of intermittent oral thrush.  CD4 nadir was below 35 undetectable on diagnosis of HIV/AIDS in May 2021 ?Current CD4 count is 120 with detectable viral load ? ?Denies any food impactions. ?Family history positive for colon cancer in her father ? 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 Maisie Fus ?Complains of intermittent epigastric abdominal pain.  No diarrhea, melena or rectal bleeding. ? ?Colonoscopy in October 2019 by Dr. Kinnie Scales, full report is not available to review but on review of chart in Care Everywhere no polyps were removed and recommended recall colonoscopy in October 2024 ? s/p hemorrhoidal band ligation X3 by Dr. Kinnie Scales in 2020 ? ? ? ? ?Outpatient Encounter Medications as of 05/12/2021  ?Medication Sig  ? amLODipine (NORVASC) 2.5 MG tablet daily.  ? aspirin 81 MG tablet Take 81 mg by mouth daily.  ? atenolol (TENORMIN) 50 MG tablet Take 50 mg by mouth daily.   ? buPROPion ER (WELLBUTRIN SR) 100 MG 12 hr tablet 1 tablet in the morning  ? CALCIUM PO Take by mouth every evening.  ? Cholecalciferol (VITAMIN D PO) Take by mouth every evening.  ? clonazePAM (KLONOPIN) 0.5 MG tablet Take 0.5 mg by mouth every evening.  ? diazepam (VALIUM) 5 MG tablet Take 1 tablet (5 mg total) by mouth every 6 (six) hours as needed for anxiety.  ? dolutegravir-lamiVUDine (DOVATO) 50-300 MG tablet Take 1 tablet by mouth daily.  ? EMGALITY 120 MG/ML SOAJ Inject into the skin every 30 (thirty) days.  ? estradiol (ESTRACE) 2 MG tablet Take 2 mg by mouth daily.  ? naproxen (NAPROSYN) 500 MG tablet Take 500  mg by mouth 2 (two) times daily as needed.  ? nortriptyline (PAMELOR) 50 MG capsule Take 50 mg by mouth daily.  ? Probiotic Product (ALIGN PO) Take 1 capsule by mouth 2 (two) times daily. PM AND QHS  PROBIOTIC 25 BILLION PER STRAIN  ? progesterone (PROMETRIUM) 200 MG capsule Take 200 mg by mouth at bedtime.  ? rizatriptan (MAXALT) 10 MG tablet Take 10 mg by mouth as needed for migraine. May repeat in 2 hours if needed  ? rosuvastatin (CRESTOR) 10 MG tablet Take 1 tablet (10 mg total) by mouth daily.  ? sulfamethoxazole-trimethoprim (BACTRIM) 400-80 MG tablet TAKE 1 TABLET BY MOUTH EVERY DAY  ? modafinil (PROVIGIL) 100 MG tablet Take 1 tablet (100 mg total) by mouth daily for 3 days, THEN 2 tablets (200 mg total) daily for 27 days.  ? [DISCONTINUED] fostemsavir tromethamine (RUKOBIA) 600 MG TB12 ER tablet Take 1 tablet (600 mg total) by mouth 2 (two) times daily.  ? [DISCONTINUED] NON FORMULARY 75 mg every evening. Pregnenolone  ? ?Facility-Administered Encounter Medications as of 05/12/2021  ?Medication  ? albuterol (VENTOLIN HFA) 108 (90 Base) MCG/ACT inhaler 2 puff  ? diphenhydrAMINE (BENADRYL) injection 50 mg  ? EPINEPHrine (EPI-PEN) injection 0.3 mg  ? methylPREDNISolone sodium succinate (SOLU-MEDROL) 125 mg/2 mL injection 125 mg  ? ? ?Allergies as of 05/12/2021 - Review Complete 05/12/2021  ?Allergen Reaction Noted  ? Dust mite extract  Other (See Comments) 03/26/2017  ? Mold extract  [trichophyton] Other (See Comments) 03/26/2017  ? Trazodone hcl Other (See Comments) 09/21/2019  ? ? ?Past Medical History:  ?Diagnosis Date  ? Anxiety   ? Depression   ? Depression with anxiety   ? HIV disease (HCC) 06/08/2019  ? Hyperlipidemia   ? Hypertension   ? Migraine   ? PMB (postmenopausal bleeding)   ? Seasonal allergies   ? Situational stress   ? Vaginal Pap smear, abnormal   ? Wears glasses   ? ? ?Past Surgical History:  ?Procedure Laterality Date  ? CARPAL TUNNEL RELEASE Right 2016  ? CARPAL TUNNEL RELEASE Left  10/11/2014  ? Procedure: LEFT  ENDOSCOPIC CARPAL TUNNEL RELEASE;  Surgeon: Mack Hook, MD;  Location: [facility];  Service: Orthopedics;  Laterality: Left;  ? CESAREAN SECTION    ? CHOLECYSTECTOMY  2017  ? LAPAROSCOPIC  ? COLONSCOPY  LAST DONE 2019  ? DILATATION & CURETTAGE/HYSTEROSCOPY WITH MYOSURE N/A 05/20/2020  ? Procedure: DILATATION & CURETTAGE/HYSTEROSCOPY;  Surgeon: Harold Hedge, MD;  Location: [facility];  Service: Gynecology;  Laterality: N/A;  ? DILATION AND CURETTAGE OF UTERUS    ? X 1  ? EYE SURGERY  AGE 48  ? TRIGGER FINGER RELEASE Right 12/09/2017  ? Procedure: RELEASE TRIGGER FINGER/A-1 PULLEY;  Surgeon: Mack Hook, MD;  Location: [facility];  Service: Orthopedics;  Laterality: Right;  ? ? ?Family History  ?Problem Relation Age of Onset  ? Heart failure Mother   ? Cancer Father   ? CAD Father   ?     MI in his 71s  ? Heart attack Father   ? Prostate cancer Father 25  ? Colon cancer Father 64  ? CAD Sister   ?     Died at age 27 of MI  ? Heart attack Sister   ? Hypertension Brother   ? ? ?Social History  ? ?Socioeconomic History  ? Marital status: Divorced  ?  Spouse name: Not on file  ? Number of children: 5  ? Years of education: 5  ? Highest education level: Not on file  ?Occupational History  ? Not on file  ?Tobacco Use  ? Smoking status: Never  ? Smokeless tobacco: Never  ?Vaping Use  ? Vaping Use: Never used  ?Substance and Sexual Activity  ? Alcohol use: No  ? Drug use: No  ? Sexual activity: Not Currently  ?  Comment: declined condoms  ?Other Topics Concern  ? Not on file  ?Social History Narrative  ? Lives with husband in a one story home.  Has 5 children (2 biological and 3 adopted).  Works as a Clinical research associate.  Education: college.   ? ?Social Determinants of Health  ? ?Financial Resource Strain: Not on file  ?Food Insecurity: Not on file  ?Transportation Needs: Not on file  ?Physical Activity: Not on file  ?Stress: Not on file  ?Social  Connections: Not on file  ?Intimate Partner Violence: Not on file  ? ? ? ? ?Review of systems: ?All other review of systems negative except as mentioned in the HPI. ? ? ?Physical Exam: ?Vitals:  ? 05/12/21 0950  ?BP: 100/72  ?Pulse: 90  ? ?Body mass index is 26.46 kg/m?. ?Gen:      No acute distress ?HEENT:  sclera anicteric ?CV: S1-S2 regular ?Lungs: Clear ?Abd:      soft, non-tender; no palpable masses, no distension ?Ext:    No edema ?  Neuro: alert and oriented x 3 ?Psych: normal mood and affect ? ?Data Reviewed: ? ?Reviewed labs, radiology imaging, old records and pertinent past GI work up ? ? ?Assessment and Plan/Recommendations: ? ?61 year old very pleasant female with diagnosis of HIV/AIDS 2021 on treatment with detectable viral load and CD4 count 120 with complaints of dysphagia, odynophagia and epigastric abdominal discomfort ?We will empirically treat with fluconazole 100 mg daily for 14 days for Candida esophagitis.  If has persistent symptoms will plan to extend treatment course, may need to stay on prophylactic dose to prevent recurrence given low CD4 count ? ?Obtain barium esophagram with tablet to exclude peptic stricture or any mucosal lesions ?We will plan to proceed EGD based on findings of esophagram if needed for further evaluation and management if continues to have persistent symptoms ? ?Due for recall colonoscopy in October 2024 due to family history of colon cancer ? ?Return in 3 to 4 weeks after barium esophagram ? ? The patient was provided an opportunity to ask questions and all were answered. The patient agreed with the plan and demonstrated an understanding of the instructions. ? ?K. Scherry RanVeena Augustina Braddock , MD ?  ? ?CC: Blanchard Kelchixon, Stephanie N, NP ? ? ?

## 2021-05-15 ENCOUNTER — Other Ambulatory Visit: Payer: 59

## 2021-05-16 ENCOUNTER — Ambulatory Visit (INDEPENDENT_AMBULATORY_CARE_PROVIDER_SITE_OTHER): Payer: 59

## 2021-05-16 DIAGNOSIS — R413 Other amnesia: Secondary | ICD-10-CM | POA: Diagnosis not present

## 2021-05-16 DIAGNOSIS — R5383 Other fatigue: Secondary | ICD-10-CM | POA: Diagnosis not present

## 2021-05-16 MED ORDER — GADOBUTROL 1 MMOL/ML IV SOLN
7.5000 mL | Freq: Once | INTRAVENOUS | Status: AC | PRN
  Administered 2021-05-16: 7.5 mL via INTRAVENOUS

## 2021-05-17 DIAGNOSIS — M6281 Muscle weakness (generalized): Secondary | ICD-10-CM

## 2021-05-17 DIAGNOSIS — G43819 Other migraine, intractable, without status migrainosus: Secondary | ICD-10-CM

## 2021-05-17 DIAGNOSIS — R9082 White matter disease, unspecified: Secondary | ICD-10-CM

## 2021-05-17 NOTE — Progress Notes (Signed)
MRI with some abnormalities in white matter - nonspecific but consideration of chronic headaches, prior infection (unclear if HIV can cause these changes) or possible demyelinating process as MS. Given her relapsing symptoms I recommended Neurology referral for further work up and evaluation to ensure we get the right diagnosis here if we can to explain her symptoms.

## 2021-05-22 ENCOUNTER — Encounter: Payer: Self-pay | Admitting: Gastroenterology

## 2021-05-22 ENCOUNTER — Ambulatory Visit (HOSPITAL_COMMUNITY)
Admission: RE | Admit: 2021-05-22 | Discharge: 2021-05-22 | Disposition: A | Discharge status: Home / Self Care | Payer: 59 | Source: Ambulatory Visit | Attending: Gastroenterology | Admitting: Gastroenterology

## 2021-05-22 DIAGNOSIS — R1310 Dysphagia, unspecified: Secondary | ICD-10-CM | POA: Diagnosis present

## 2021-05-26 ENCOUNTER — Other Ambulatory Visit: Payer: Self-pay

## 2021-05-26 ENCOUNTER — Other Ambulatory Visit: Payer: Self-pay | Admitting: Gastroenterology

## 2021-05-26 DIAGNOSIS — R1310 Dysphagia, unspecified: Secondary | ICD-10-CM

## 2021-05-26 MED ORDER — PANTOPRAZOLE SODIUM 40 MG PO TBEC: 40.0000 mg | DELAYED_RELEASE_TABLET | Freq: Two times a day (BID) | ORAL | 3 refills | Status: DC

## 2021-05-31 ENCOUNTER — Other Ambulatory Visit: Payer: Self-pay

## 2021-05-31 ENCOUNTER — Encounter: Payer: Self-pay | Admitting: Obstetrics and Gynecology

## 2021-05-31 ENCOUNTER — Ambulatory Visit: Payer: 59 | Admitting: Obstetrics and Gynecology

## 2021-05-31 ENCOUNTER — Telehealth: Payer: Self-pay

## 2021-05-31 VITALS — BP 118/76 | HR 86 | Ht 65.0 in | Wt 159.7 lb

## 2021-05-31 DIAGNOSIS — A630 Anogenital (venereal) warts: Secondary | ICD-10-CM | POA: Diagnosis not present

## 2021-05-31 MED ORDER — IMIQUIMOD 5 % EX CREA: TOPICAL_CREAM | CUTANEOUS | 5 refills | Status: AC

## 2021-05-31 MED ORDER — OMEPRAZOLE 40 MG PO CPDR: DELAYED_RELEASE_CAPSULE | ORAL | 3 refills | Status: DC

## 2021-05-31 NOTE — Telephone Encounter (Signed)
DOD Patient of Dr Woodward Ku who is being treated empirically for Candida esophagitis with fluconazole. She is being treated for "significant GERD" with pantoprazole BID. Her insurance is not covering pantoprazole. Patient is currently on antivirals for treatment of HIV, AIDS. Please advise on change of PPI. Thank you

## 2021-05-31 NOTE — Telephone Encounter (Signed)
If she has no allergy to or medication interactions that come up, can switch and give omeprazole 40mg  BID in its place. Thanks

## 2021-05-31 NOTE — Progress Notes (Signed)
Stacy Watkins returns for follow up [facility] treatment for genital warts.   PE AF VSS Chaperone present during exam  Lungs clear Heart RRR Abd soft + BS GU Nl EGBUS, warts have resolved  A/P Genital Warts  Aldara PRN. F/U PRN

## 2021-05-31 NOTE — Progress Notes (Signed)
Patient presents for 4 week follow up for genital warts. Treated with TCA. Patient states that she has seen some improvement with treatment. No other concerns.

## 2021-06-14 ENCOUNTER — Ambulatory Visit: Payer: 59 | Admitting: Physician Assistant

## 2021-06-15 ENCOUNTER — Ambulatory Visit (INDEPENDENT_AMBULATORY_CARE_PROVIDER_SITE_OTHER): Payer: 59

## 2021-06-15 DIAGNOSIS — Z1231 Encounter for screening mammogram for malignant neoplasm of breast: Secondary | ICD-10-CM | POA: Diagnosis not present

## 2021-06-21 ENCOUNTER — Ambulatory Visit: Payer: 59 | Admitting: Gastroenterology

## 2021-06-23 ENCOUNTER — Ambulatory Visit (INDEPENDENT_AMBULATORY_CARE_PROVIDER_SITE_OTHER): Payer: 59 | Admitting: Diagnostic Neuroimaging

## 2021-06-23 ENCOUNTER — Encounter: Payer: Self-pay | Admitting: Diagnostic Neuroimaging

## 2021-06-23 VITALS — BP 109/73 | HR 93 | Ht 65.0 in | Wt 160.0 lb

## 2021-06-23 DIAGNOSIS — G43009 Migraine without aura, not intractable, without status migrainosus: Secondary | ICD-10-CM

## 2021-06-23 DIAGNOSIS — R5382 Chronic fatigue, unspecified: Secondary | ICD-10-CM

## 2021-06-23 MED ORDER — ATENOLOL 25 MG PO TABS: 25.0000 mg | ORAL_TABLET | Freq: Every day | ORAL | 0 refills | Status: DC

## 2021-06-23 MED ORDER — RIZATRIPTAN BENZOATE 10 MG PO TABS: 10.0000 mg | ORAL_TABLET | ORAL | 6 refills | Status: DC | PRN

## 2021-06-23 MED ORDER — EMGALITY 120 MG/ML ~~LOC~~ SOAJ: 120.0000 mg | SUBCUTANEOUS | 6 refills | Status: DC

## 2021-06-27 ENCOUNTER — Encounter: Payer: Self-pay | Admitting: Gastroenterology

## 2021-06-28 ENCOUNTER — Encounter: Payer: Self-pay | Admitting: Gastroenterology

## 2021-07-11 ENCOUNTER — Ambulatory Visit (AMBULATORY_SURGERY_CENTER): Payer: 59 | Admitting: Gastroenterology

## 2021-07-11 ENCOUNTER — Encounter: Payer: Self-pay | Admitting: Gastroenterology

## 2021-07-11 VITALS — BP 132/72 | HR 91 | Temp 97.5°F | Resp 14 | Ht 65.0 in | Wt 159.0 lb

## 2021-07-11 DIAGNOSIS — K2970 Gastritis, unspecified, without bleeding: Secondary | ICD-10-CM

## 2021-07-11 DIAGNOSIS — K449 Diaphragmatic hernia without obstruction or gangrene: Secondary | ICD-10-CM | POA: Diagnosis not present

## 2021-07-11 DIAGNOSIS — R1310 Dysphagia, unspecified: Secondary | ICD-10-CM

## 2021-07-11 MED ORDER — SODIUM CHLORIDE 0.9 % IV SOLN: 500.0000 mL | Freq: Once | INTRAVENOUS | Status: DC

## 2021-07-11 NOTE — Progress Notes (Signed)
[facility] History and Physical   Primary Care Physician:  Merri Brunette, MD   Reason for Procedure:  Dysphagia  Plan:    EGD with possible interventions as needed     HPI: Stacy Watkins is a very pleasant 61 y.o. female here for EGD for evaluation of dysphagia. Denies any nausea, vomiting, abdominal pain, melena or bright red blood per rectum  The risks and benefits as well as alternatives of endoscopic procedure(s) have been discussed and reviewed. All questions answered. The patient agrees to proceed.    Past Medical History:  Diagnosis Date   Anxiety    Depression    Depression with anxiety    HIV disease (HCC) 06/08/2019   Hyperlipidemia    Hypertension    Migraine    PMB (postmenopausal bleeding)    Seasonal allergies    Situational stress    Vaginal Pap smear, abnormal    Wears glasses     Past Surgical History:  Procedure Laterality Date   CARPAL TUNNEL RELEASE Right 2016   CARPAL TUNNEL RELEASE Left 10/11/2014   Procedure: LEFT  ENDOSCOPIC CARPAL TUNNEL RELEASE;  Surgeon: Mack Hook, MD;  Location: [facility];  Service: Orthopedics;  Laterality: Left;   CESAREAN SECTION     CHOLECYSTECTOMY  2017   LAPAROSCOPIC   COLONSCOPY  LAST DONE 2019   DILATATION & CURETTAGE/HYSTEROSCOPY WITH MYOSURE N/A 05/20/2020   Procedure: DILATATION & CURETTAGE/HYSTEROSCOPY;  Surgeon: Harold Hedge, MD;  Location: [facility];  Service: Gynecology;  Laterality: N/A;   DILATION AND CURETTAGE OF UTERUS     X 1   EYE SURGERY  AGE 75   TRIGGER FINGER RELEASE Right 12/09/2017   Procedure: RELEASE TRIGGER FINGER/A-1 PULLEY;  Surgeon: Mack Hook, MD;  Location: [facility];  Service: Orthopedics;  Laterality: Right;    Prior to Admission medications   Medication Sig Start Date End Date Taking? Authorizing Provider  ALPRAZolam (XANAX) 0.5 MG tablet TAKE 1/2   1 TABLET TWICE A DAY AS NEEDED   Yes [provider]  amLODipine (NORVASC) 2.5 MG tablet daily. 04/05/20  Yes [provider]  aspirin 81 MG tablet Take 81 mg by mouth daily.   Yes [provider]  atenolol (TENORMIN) 25 MG tablet Take 1 tablet (25 mg total) by mouth daily. 06/23/21  Yes Penumalli, Glenford Bayley, MD  buPROPion ER (WELLBUTRIN SR) 100 MG 12 hr tablet 1 tablet in the morning   Yes [provider]  CALCIUM PO Take by mouth every evening.   Yes [provider]  Cholecalciferol (VITAMIN D PO) Take by mouth every evening.   Yes [provider]  clonazePAM (KLONOPIN) 0.5 MG tablet Take 0.5 mg by mouth every evening. 05/10/20  Yes [provider]  dolutegravir-lamiVUDine (DOVATO) 50-300 MG tablet Take 1 tablet by mouth daily. 03/23/21  Yes Blanchard Kelch, NP  imiquimod (ALDARA) 5 % cream Apply topically 3 (three) times a week. Apply until total clearance or maximum of 16 weeks 05/31/21  Yes Hermina Staggers, MD  Melatonin 10 MG CAPS See admin instructions.   Yes [provider]  naproxen (NAPROSYN) 500 MG tablet Take 500 mg by mouth 2 (two) times daily as needed. 08/21/20  Yes [provider]  nortriptyline (PAMELOR) 50 MG capsule Take 50 mg by mouth daily.   Yes [provider]  omeprazole (PRILOSEC) 40 MG capsule 1 capsule twice daily on an empty stomach 30 minutes before a  meal 05/31/21  Yes Cesareo Vickrey, Eleonore Chiquito, MD  Probiotic Product (ALIGN PO) Take 1 capsule by mouth 2 (two) times daily. PM AND QHS  PROBIOTIC 25 BILLION PER STRAIN   Yes [provider]  progesterone (PROMETRIUM) 200 MG capsule Take 200 mg by mouth at bedtime. 08/05/20  Yes [provider]  rizatriptan (MAXALT) 10 MG tablet Take 1 tablet (10 mg total) by mouth as needed for migraine. May repeat in 2 hours if needed 06/23/21  Yes Penumalli, Glenford Bayley, MD  rosuvastatin (CRESTOR) 10 MG tablet Take 1 tablet (10 mg total) by mouth daily. 02/06/21  Yes Lewayne Bunting, MD   sulfamethoxazole-trimethoprim (BACTRIM) 400-80 MG tablet TAKE 1 TABLET BY MOUTH EVERY DAY 05/08/21  Yes Blanchard Kelch, NP  cyclobenzaprine (FLEXERIL) 10 MG tablet TAKE ONE TABLET BY MOUTH 3 TIMES A DAY AS NEEDED.    [provider]  EMGALITY 120 MG/ML SOAJ Inject 120 mg into the skin every 30 (thirty) days. 06/23/21   Penumalli, Glenford Bayley, MD  estradiol (ESTRACE) 2 MG tablet Take 2 mg by mouth daily. 08/05/20   [provider]  modafinil (PROVIGIL) 100 MG tablet Take 1 tablet (100 mg total) by mouth daily for 3 days, THEN 2 tablets (200 mg total) daily for 27 days. 03/14/21 04/13/21  Blanchard Kelch, NP  zolpidem (AMBIEN) 10 MG tablet Take 5-10 mg by mouth at bedtime as needed. 06/19/21   [provider]    Current Outpatient Medications  Medication Sig Dispense Refill   ALPRAZolam (XANAX) 0.5 MG tablet TAKE 1/2   1 TABLET TWICE A DAY AS NEEDED     amLODipine (NORVASC) 2.5 MG tablet daily.     aspirin 81 MG tablet Take 81 mg by mouth daily.     atenolol (TENORMIN) 25 MG tablet Take 1 tablet (25 mg total) by mouth daily. 14 tablet 0   buPROPion ER (WELLBUTRIN SR) 100 MG 12 hr tablet 1 tablet in the morning     CALCIUM PO Take by mouth every evening.     Cholecalciferol (VITAMIN D PO) Take by mouth every evening.     clonazePAM (KLONOPIN) 0.5 MG tablet Take 0.5 mg by mouth every evening.     dolutegravir-lamiVUDine (DOVATO) 50-300 MG tablet Take 1 tablet by mouth daily. 30 tablet 11   imiquimod (ALDARA) 5 % cream Apply topically 3 (three) times a week. Apply until total clearance or maximum of 16 weeks 24 each 5   Melatonin 10 MG CAPS See admin instructions.     naproxen (NAPROSYN) 500 MG tablet Take 500 mg by mouth 2 (two) times daily as needed.     nortriptyline (PAMELOR) 50 MG capsule Take 50 mg by mouth daily.     omeprazole (PRILOSEC) 40 MG capsule 1 capsule twice daily on an empty stomach 30 minutes before a meal 60 capsule 3   Probiotic Product (ALIGN PO) Take  1 capsule by mouth 2 (two) times daily. PM AND QHS  PROBIOTIC 25 BILLION PER STRAIN     progesterone (PROMETRIUM) 200 MG capsule Take 200 mg by mouth at bedtime.     rizatriptan (MAXALT) 10 MG tablet Take 1 tablet (10 mg total) by mouth as needed for migraine. May repeat in 2 hours if needed 10 tablet 6   rosuvastatin (CRESTOR) 10 MG tablet Take 1 tablet (10 mg total) by mouth daily. 90 tablet 1   sulfamethoxazole-trimethoprim (BACTRIM) 400-80 MG tablet TAKE 1 TABLET BY MOUTH EVERY DAY 30 tablet  3   cyclobenzaprine (FLEXERIL) 10 MG tablet TAKE ONE TABLET BY MOUTH 3 TIMES A DAY AS NEEDED.     EMGALITY 120 MG/ML SOAJ Inject 120 mg into the skin every 30 (thirty) days. 1.12 mL 6   estradiol (ESTRACE) 2 MG tablet Take 2 mg by mouth daily.     modafinil (PROVIGIL) 100 MG tablet Take 1 tablet (100 mg total) by mouth daily for 3 days, THEN 2 tablets (200 mg total) daily for 27 days. 57 tablet 0   zolpidem (AMBIEN) 10 MG tablet Take 5-10 mg by mouth at bedtime as needed.     Current Facility-Administered Medications  Medication Dose Route Frequency Provider Last Rate Last Admin   0.9 %  sodium chloride infusion  500 mL Intravenous Once Brainard Highfill V, MD       albuterol (VENTOLIN HFA) 108 (90 Base) MCG/ACT inhaler 2 puff  2 puff Inhalation Once PRN Yoakum Callas, NP       diphenhydrAMINE (BENADRYL) injection 50 mg  50 mg Intramuscular Once PRN Woodbine Callas, NP       EPINEPHrine (EPI-PEN) injection 0.3 mg  0.3 mg Intramuscular Once PRN Uriah Callas, NP        Allergies as of 07/11/2021 - Review Complete 07/11/2021  Allergen Reaction Noted   Dust mite extract Other (See Comments) 03/26/2017   Mold extract  [trichophyton] Other (See Comments) 03/26/2017   Trazodone hcl Other (See Comments) 09/21/2019    Family History  Problem Relation Age of Onset   Heart failure Mother    Cancer Father    CAD Father        MI in his 64s   Heart attack Father    Prostate cancer Father 41    Colon cancer Father 90   CAD Sister        Died at age 84 of MI   Heart attack Sister    Hypertension Brother    Migraines Neg Hx    Esophageal cancer Neg Hx    Rectal cancer Neg Hx    Stomach cancer Neg Hx     Social History   Socioeconomic History   Marital status: Divorced    Spouse name: Not on file   Number of children: 5   Years of education: 16   Highest education level: Not on file  Occupational History   Not on file  Tobacco Use   Smoking status: Never   Smokeless tobacco: Never  Vaping Use   Vaping Use: Never used  Substance and Sexual Activity   Alcohol use: No   Drug use: No   Sexual activity: Not Currently    Comment: declined condoms  Other Topics Concern   Not on file  Social History Narrative   Lives with husband in a one story home.  Has 5 children (2 biological and 3 adopted).  Works as a Probation officer.  Education: college.    Social Determinants of Health   Financial Resource Strain: Not on file  Food Insecurity: Not on file  Transportation Needs: Not on file  Physical Activity: Not on file  Stress: Not on file  Social Connections: Not on file  Intimate Partner Violence: Not on file    Review of Systems:  All other review of systems negative except as mentioned in the HPI.  Physical Exam: Vital signs in last 24 hours: BP (!) 156/81   Pulse 94   Temp (!) 97.5 F (36.4 C)   Resp 11  Ht 5\' 5"  (1.651 m)   Wt 159 lb (72.1 kg)   LMP 09/24/2012   SpO2 97%   BMI 26.46 kg/m  General:   Alert, NAD Lungs:  Clear .   Heart:  Regular rate and rhythm Abdomen:  Soft, nontender and nondistended. Neuro/Psych:  Alert and cooperative. Normal mood and affect. A and O x 3  Reviewed labs, radiology imaging, old records and pertinent past GI work up  Patient is appropriate for planned procedure(s) and anesthesia in an ambulatory setting   K. Denzil Magnuson , MD [phone]

## 2021-07-11 NOTE — Op Note (Signed)
[facility] Patient Name: Stacy Watkins Procedure Date: 07/11/2021 10:13 AM MRN: [id number] Endoscopist: Mauri Pole , MD Age: 61 Referring MD:  Date of Birth: 03-17-1960 Gender: Female Account #: [id number] Procedure:                Upper GI endoscopy Indications:              Dysphagia, Odynophagia Medicines:                Monitored Anesthesia Care Procedure:                Pre-Anesthesia Assessment:                           - Prior to the procedure, a History and Physical                            was performed, and patient medications and                            allergies were reviewed. The patient's tolerance of                            previous anesthesia was also reviewed. The risks                            and benefits of the procedure and the sedation                            options and risks were discussed with the patient.                            All questions were answered, and informed consent                            was obtained. Prior Anticoagulants: The patient has                            taken no previous anticoagulant or antiplatelet                            agents. ASA Grade Assessment: III - A patient with                            severe systemic disease. After reviewing the risks                            and benefits, the patient was deemed in                            satisfactory condition to undergo the procedure.                           After obtaining informed consent, the endoscope was  passed under direct vision. Throughout the                            procedure, the patient's blood pressure, pulse, and                            oxygen saturations were monitored continuously. The                            GIF [id number] #[id number] was introduced through the                            mouth, and advanced to the second part of duodenum.                            The upper GI endoscopy  was accomplished without                            difficulty. The patient tolerated the procedure                            well. Scope In: Scope Out: Findings:                 No gross lesions were noted in the entire                            esophagus. Biopsies were taken with a cold forceps                            for histology.                           A small hiatal hernia was present.                           Patchy mild inflammation characterized by                            congestion (edema), erosions and erythema was found                            in the entire examined stomach. Biopsies were taken                            with a cold forceps for histology. Biopsies were                            taken with a cold forceps for Helicobacter pylori                            testing.                           The cardia and gastric fundus were normal on  retroflexion other than mild gastritis.                           The examined duodenum was normal. Complications:            No immediate complications. Estimated Blood Loss:     Estimated blood loss was minimal. Impression:               - No gross lesions in esophagus. Biopsied.                           - Small hiatal hernia.                           - Gastritis. Biopsied.                           - Normal examined duodenum. Recommendation:           - Patient has a contact number available for                            emergencies. The signs and symptoms of potential                            delayed complications were discussed with the                            patient. Return to normal activities tomorrow.                            Written discharge instructions were provided to the                            patient.                           - Resume previous diet.                           - Continue present medications.                           - Await pathology  results. Napoleon Form, MD 07/11/2021 10:37:23 AM This report has been signed electronically.

## 2021-07-11 NOTE — Progress Notes (Signed)
VS by CW  Pt's states no medical or surgical changes since previsit or office visit.  

## 2021-07-11 NOTE — Patient Instructions (Signed)
Await pathology results.  Handout on Gastritis given.  YOU HAD AN ENDOSCOPIC PROCEDURE TODAY AT THE [facility]:   Refer to the procedure report that was given to you for any specific questions about what was found during the examination.  If the procedure report does not answer your questions, please call your gastroenterologist to clarify.  If you requested that your care partner not be given the details of your procedure findings, then the procedure report has been included in a sealed envelope for you to review at your convenience later.  YOU SHOULD EXPECT: Some feelings of bloating in the abdomen. Passage of more gas than usual.  Walking can help get rid of the air that was put into your GI tract during the procedure and reduce the bloating. If you had a lower endoscopy (such as a colonoscopy or flexible sigmoidoscopy) you may notice spotting of blood in your stool or on the toilet paper. If you underwent a bowel prep for your procedure, you may not have a normal bowel movement for a few days.  Please Note:  You might notice some irritation and congestion in your nose or some drainage.  This is from the oxygen used during your procedure.  There is no need for concern and it should clear up in a day or so.  SYMPTOMS TO REPORT IMMEDIATELY:  Following upper endoscopy (EGD)  Vomiting of blood or coffee ground material  New chest pain or pain under the shoulder blades  Painful or persistently difficult swallowing  New shortness of breath  Fever of 100F or higher  Black, tarry-looking stools  For urgent or emergent issues, a gastroenterologist can be reached at any hour by calling [phone]. Do not use MyChart messaging for urgent concerns.    DIET:  We do recommend a small meal at first, but then you may proceed to your regular diet.  Drink plenty of fluids but you should avoid alcoholic beverages for 24 hours.  ACTIVITY:  You should plan to take it easy for the rest of  today and you should NOT DRIVE or use heavy machinery until tomorrow (because of the sedation medicines used during the test).    FOLLOW UP: Our staff will call the number listed on your records the next business day following your procedure.  We will call around 7:15- 8:00 am to check on you and address any questions or concerns that you may have regarding the information given to you following your procedure. If we do not reach you, we will leave a message.  If you develop any symptoms (ie: fever, flu-like symptoms, shortness of breath, cough etc.) before then, please call [phone].  If you test positive for Covid 19 in the 2 weeks post procedure, please call and report this information to [name].    If any biopsies were taken you will be contacted by phone or by letter within the next 1-3 weeks.  Please call us at [phone] if you have not heard about the biopsies in 3 weeks.    SIGNATURES/CONFIDENTIALITY: You and/or your care partner have signed paperwork which will be entered into your electronic medical record.  These signatures attest to the fact that that the information above on your After Visit Summary has been reviewed and is understood.  Full responsibility of the confidentiality of this discharge information lies with you and/or your care-partner.

## 2021-07-11 NOTE — Progress Notes (Signed)
Report to PACU, RN, vss, BBS= Clear.  

## 2021-07-11 NOTE — Progress Notes (Signed)
Called to room to assist during endoscopic procedure.  Patient ID and intended procedure confirmed with present staff. Received instructions for my participation in the procedure from the performing physician.  

## 2021-07-12 ENCOUNTER — Telehealth: Payer: Self-pay

## 2021-07-12 NOTE — Telephone Encounter (Signed)
  Follow up Call-     07/11/2021    9:41 AM  Call back number  Post procedure Call Back phone  # [phone]  Permission to leave phone message Yes     Patient questions:  Do you have a fever, pain , or abdominal swelling? No. Pain Score  0 *  Have you tolerated food without any problems? Yes.    Have you been able to return to your normal activities? Yes.    Do you have any questions about your discharge instructions: Diet   No. Medications  No. Follow up visit  No.  Do you have questions or concerns about your Care? No.  Actions: * If pain score is 4 or above: No action needed, pain <4.

## 2021-07-18 ENCOUNTER — Encounter: Payer: Self-pay | Admitting: Gastroenterology

## 2021-07-18 ENCOUNTER — Encounter: Payer: Self-pay | Admitting: Diagnostic Neuroimaging

## 2021-07-23 DIAGNOSIS — E7800 Pure hypercholesterolemia, unspecified: Secondary | ICD-10-CM

## 2021-07-25 ENCOUNTER — Other Ambulatory Visit: Payer: Self-pay | Admitting: Infectious Diseases

## 2021-07-25 ENCOUNTER — Encounter: Payer: Self-pay | Admitting: Gastroenterology

## 2021-07-25 DIAGNOSIS — R5383 Other fatigue: Secondary | ICD-10-CM

## 2021-07-25 MED ORDER — MODAFINIL 200 MG PO TABS
200.0000 mg | ORAL_TABLET | Freq: Every day | ORAL | 2 refills | Status: DC
Start: 2021-07-25 — End: 2022-03-12

## 2021-07-25 NOTE — Telephone Encounter (Signed)
Please advise on refill.

## 2021-07-25 NOTE — Telephone Encounter (Signed)
Left voicemail for patient requesting call back to discuss treatment.  Will need to confirm which dose was effective 100 mg or 200 mg Juanita Laster, RMA

## 2021-07-29 ENCOUNTER — Other Ambulatory Visit: Payer: Self-pay | Admitting: Cardiology

## 2021-07-29 DIAGNOSIS — E7800 Pure hypercholesterolemia, unspecified: Secondary | ICD-10-CM

## 2021-07-31 ENCOUNTER — Encounter: Payer: Self-pay | Admitting: Diagnostic Neuroimaging

## 2021-08-04 ENCOUNTER — Other Ambulatory Visit: Payer: 59

## 2021-08-04 ENCOUNTER — Encounter: Payer: Self-pay | Admitting: Infectious Diseases

## 2021-08-04 ENCOUNTER — Ambulatory Visit: Payer: 59

## 2021-08-04 ENCOUNTER — Other Ambulatory Visit: Payer: Self-pay

## 2021-08-10 ENCOUNTER — Encounter: Payer: Self-pay | Admitting: Diagnostic Neuroimaging

## 2021-08-10 NOTE — Telephone Encounter (Signed)
One Emgality sample in drug frig labeled with patient's name, DOB.

## 2021-08-15 ENCOUNTER — Encounter: Payer: Self-pay | Admitting: Infectious Diseases

## 2021-08-15 ENCOUNTER — Other Ambulatory Visit: Payer: Self-pay

## 2021-08-15 ENCOUNTER — Ambulatory Visit: Payer: 59 | Admitting: Infectious Diseases

## 2021-08-15 ENCOUNTER — Ambulatory Visit (INDEPENDENT_AMBULATORY_CARE_PROVIDER_SITE_OTHER): Payer: 59 | Admitting: Infectious Diseases

## 2021-08-15 DIAGNOSIS — B20 Human immunodeficiency virus [HIV] disease: Secondary | ICD-10-CM | POA: Diagnosis not present

## 2021-08-15 DIAGNOSIS — D8989 Other specified disorders involving the immune mechanism, not elsewhere classified: Secondary | ICD-10-CM | POA: Diagnosis not present

## 2021-08-15 DIAGNOSIS — G9332 Myalgic encephalomyelitis/chronic fatigue syndrome: Secondary | ICD-10-CM | POA: Diagnosis not present

## 2021-08-15 NOTE — Assessment & Plan Note (Signed)
Have not been able to find any occult cause of many of her symptoms. 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  After discussion I think a sleep study is a good idea to complete thorough work up. I encouraged her to look at support groups for community support and ideas to help with symptoms. I am happy to hear she is doing some weight lifting - would suggest to keep with short sets and light intensity. Dumbbells or even exercise bands would be good here.  Continue to follow for her triggers and monitor for what works for her body.

## 2021-08-15 NOTE — Progress Notes (Signed)
Patient: Stacy Watkins  DOB: [birth year] MRN: [id number] PCP: Carol Ada, MD     Subjective:  Brief Narrative: Stacy Watkins is a 61 y.o. female with HIV, AIDS+ Dx 05/2019 CD4 nadir < 35 HIV Risk: sexual OI: oral thrush Intake Labs: Hep B sAg (-), sAb (-), cAb (-), Hep A Ab (-), Hep C Ab (-) Quantiferon (-) HLA B*5701 (pending)  Previous Regimens: Biktarvy >> suppressed  Rukobia added 09/2020 to try to get CD4 up Dovato   Genotype:  06-2019 - wildtype    Chief Complaint  Patient presents with   Follow-up    Chronic fatigue, is interested in a sleep study      HPI: Stacy Watkins is here for follow up. We have been working on figuring out her fatigue/exhaustion. Interested in sleep study - her daughter has severe sleep apnea and now she wonders if she may benefit from a repeat study. Had one about 18 years ago that was normal then.   No changes in periods of exertion -  she has been working on identifying triggers and trying to pace herself. Has started weight lifting which is OK. Plans to resume some walking program with friends once they are available again.    Review of Systems  Constitutional:  Positive for activity change and fatigue. Negative for unexpected weight change.  Neurological:  Positive for headaches.    Past Medical History:  Diagnosis Date   Anxiety    Depression    Depression with anxiety    HIV disease ([place]) 06/08/2019   Hyperlipidemia    Hypertension    Migraine    PMB (postmenopausal bleeding)    Seasonal allergies    Situational stress    Vaginal Pap smear, abnormal    Wears glasses     Outpatient Medications Prior to Visit  Medication Sig Dispense Refill   ALPRAZolam (XANAX) 0.5 MG tablet TAKE 1/2   1 TABLET TWICE A DAY AS NEEDED     amLODipine (NORVASC) 2.5 MG tablet daily.     aspirin 81 MG tablet Take 81 mg by mouth daily.     atenolol (TENORMIN) 25 MG tablet Take 1 tablet (25 mg total) by mouth daily. 14 tablet 0    CALCIUM PO Take by mouth every evening.     Cholecalciferol (VITAMIN D PO) Take by mouth every evening.     clonazePAM (KLONOPIN) 0.5 MG tablet Take 0.5 mg by mouth every evening.     cyclobenzaprine (FLEXERIL) 10 MG tablet TAKE ONE TABLET BY MOUTH 3 TIMES A DAY AS NEEDED.     dolutegravir-lamiVUDine (DOVATO) 50-300 MG tablet Take 1 tablet by mouth daily. 30 tablet 11   EMGALITY 120 MG/ML SOAJ Inject 120 mg into the skin every 30 (thirty) days. 1.12 mL 6   imiquimod (ALDARA) 5 % cream Apply topically 3 (three) times a week. Apply until total clearance or maximum of 16 weeks 24 each 5   Melatonin 10 MG CAPS See admin instructions.     nortriptyline (PAMELOR) 50 MG capsule Take 50 mg by mouth daily.     Probiotic Product (ALIGN PO) Take 1 capsule by mouth 2 (two) times daily. PM AND QHS  PROBIOTIC 25 BILLION PER STRAIN     progesterone (PROMETRIUM) 200 MG capsule Take 200 mg by mouth at bedtime.     rizatriptan (MAXALT) 10 MG tablet Take 1 tablet (10 mg total) by mouth as needed for migraine. May repeat in 2 hours if needed  10 tablet 6   rosuvastatin (CRESTOR) 10 MG tablet TAKE 1 TABLET BY MOUTH EVERY DAY 90 tablet 1   sulfamethoxazole-trimethoprim (BACTRIM) 400-80 MG tablet TAKE 1 TABLET BY MOUTH EVERY DAY 30 tablet 3   buPROPion ER (WELLBUTRIN SR) 100 MG 12 hr tablet 1 tablet in the morning     estradiol (ESTRACE) 2 MG tablet Take 2 mg by mouth daily.     modafinil (PROVIGIL) 100 MG tablet Take 1 tablet (100 mg total) by mouth daily for 3 days, THEN 2 tablets (200 mg total) daily for 27 days. 57 tablet 0   modafinil (PROVIGIL) 200 MG tablet Take 1 tablet (200 mg total) by mouth daily. (Patient not taking: Reported on 08/15/2021) 30 tablet 2   naproxen (NAPROSYN) 500 MG tablet Take 500 mg by mouth 2 (two) times daily as needed. (Patient not taking: Reported on 08/15/2021)     omeprazole (PRILOSEC) 40 MG capsule 1 capsule twice daily on an empty stomach 30 minutes before a meal (Patient not taking:  Reported on 08/15/2021) 60 capsule 3   zolpidem (AMBIEN) 10 MG tablet Take 5-10 mg by mouth at bedtime as needed.     Facility-Administered Medications Prior to Visit  Medication Dose Route Frequency Provider Last Rate Last Admin   albuterol (VENTOLIN HFA) 108 (90 Base) MCG/ACT inhaler 2 puff  2 puff Inhalation Once PRN Milam Callas, NP       diphenhydrAMINE (BENADRYL) injection 50 mg  50 mg Intramuscular Once PRN Doyle Callas, NP       EPINEPHrine (EPI-PEN) injection 0.3 mg  0.3 mg Intramuscular Once PRN Worland Callas, NP         Allergies  Allergen Reactions   Dust Mite Extract Other (See Comments)   Mold Extract  [Trichophyton] Other (See Comments)   Trazodone Hcl Other (See Comments)    NOT SURE REACTION    Social History   Tobacco Use   Smoking status: Never   Smokeless tobacco: Never  Vaping Use   Vaping Use: Never used  Substance Use Topics   Alcohol use: No   Drug use: No     Objective:   Vitals:   08/15/21 1056  BP: 114/73  Pulse: 81  Temp: 97.7 F (36.5 C)  TempSrc: Oral  SpO2: 98%  Weight: 155 lb (70.3 kg)  Height: 5' 5" (1.651 m)   Body mass index is 25.79 kg/m.   Physical Exam Vitals reviewed.  Constitutional:      Appearance: Normal appearance. She is not ill-appearing.  HENT:     Mouth/Throat:     Mouth: Mucous membranes are moist.     Pharynx: Oropharynx is clear.  Eyes:     General: No scleral icterus. Cardiovascular:     Rate and Rhythm: Normal rate.  Pulmonary:     Effort: Pulmonary effort is normal.  Neurological:     Mental Status: She is oriented to person, place, and time.  Psychiatric:        Mood and Affect: Mood normal.        Thought Content: Thought content normal.      Lab Results: Lab Results  Component Value Date   WBC 3.6 (L) 05/25/2020   HGB 14.7 05/25/2020   HCT 42.1 05/25/2020   MCV 91.9 05/25/2020   PLT 200 05/25/2020    Lab Results  Component Value Date   CREATININE 1.01 02/02/2021    BUN 18 02/02/2021   NA 139 02/02/2021   K  4.3 02/02/2021   CL 104 02/02/2021   CO2 29 02/02/2021    Lab Results  Component Value Date   ALT 18 02/02/2021   AST 19 02/02/2021   BILITOT 0.3 02/02/2021     Assessment & Plan:   Problem List Items Addressed This Visit       Unprioritized   HIV disease ([place])    CD4 most recently 125 cells, slow upward trend. VL undetecatble range at 30 copies. No changes to the plan of care for [facility] at this time. Continue Dovato + Bactrim daily.  Following with GYN for abnormal pap smears   She has met with financial team to discuss upcoming insurance changes.   Will plan to have her back in 6 months for routine care - labs traditionally done at [facility] but pending new insurance as to where these are to be done with new policy.       Chronic fatigue and immune dysfunction syndrome ([facility])    Have not been able to find any occult cause of many of her symptoms. 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  After discussion I think a sleep study is a good idea to complete thorough work up. I encouraged her to look at support groups for community support and ideas to help with symptoms. I am happy to hear she is doing some weight lifting - would suggest to keep with short sets and light intensity. Dumbbells or even exercise bands would be good here.  Continue to follow for her triggers and monitor for what works for her body.        Janene Madeira, MSN, NP-C [facility] for Infectious [place] Pager: [phone] Office: [phone]

## 2021-08-15 NOTE — Telephone Encounter (Signed)
Patient came and picked up Emgality sample.

## 2021-08-15 NOTE — Assessment & Plan Note (Signed)
CD4 most recently 125 cells, slow upward trend. VL undetecatble range at 30 copies. No changes to the plan of care for [facility] at this time. Continue Dovato + Bactrim daily.  Following with GYN for abnormal pap smears   She has met with financial team to discuss upcoming insurance changes.   Will plan to have her back in 6 months for routine care - labs traditionally done at [facility] but pending new insurance as to where these are to be done with new policy.

## 2021-08-21 ENCOUNTER — Ambulatory Visit: Payer: 59 | Admitting: Infectious Diseases

## 2021-08-28 ENCOUNTER — Other Ambulatory Visit: Payer: Self-pay | Admitting: Infectious Diseases

## 2021-08-28 DIAGNOSIS — B20 Human immunodeficiency virus [HIV] disease: Secondary | ICD-10-CM

## 2021-09-05 ENCOUNTER — Telehealth: Payer: Self-pay | Admitting: Pharmacy Technician

## 2021-09-05 ENCOUNTER — Encounter: Payer: Self-pay | Admitting: Gastroenterology

## 2021-09-05 ENCOUNTER — Telehealth: Payer: Self-pay | Admitting: *Deleted

## 2021-09-05 ENCOUNTER — Other Ambulatory Visit (HOSPITAL_COMMUNITY): Payer: Self-pay

## 2021-09-05 NOTE — Telephone Encounter (Signed)
Patient Advocate Encounter  Received notification that prior authorization for OMEPRAZOLE 40MG  is required.   PA submitted on 9.5.23 Key [id number] Status is pending    [id number], CPhT Patient Advocate Phone: [phone]

## 2021-09-05 NOTE — Telephone Encounter (Signed)
Called [facility] for emgality PA,  spoke with Shanda Bumps, patient has discount card but not pharmacy coverage through [place]. Called [facility] spoke with pharmacist who stated she has BCBS, ID [id number], group [id number], BIN [id number].  CMM Key: [id number], [id number] Your information has been submitted to [facility] [place]. [employer] [place] will review the request and notify you of the determination decision directly, typically within 72 hours of receiving all information. If [employer] has not responded within the specified timeframe or if you have any questions about your PA submission, contact [employer] [place] directly at [phone].

## 2021-09-06 ENCOUNTER — Other Ambulatory Visit (HOSPITAL_COMMUNITY): Payer: Self-pay

## 2021-09-06 NOTE — Telephone Encounter (Signed)
Received fax from [facility] with additional clinical questions.  Placed on MD desk for review, signature.

## 2021-09-06 NOTE — Telephone Encounter (Signed)
Received a fax regarding Prior Authorization from [facility] for OMEPRAZOLE 40MG . Authorization has been DENIED because OTC ALTERNATIVE IS AVAILABLE.

## 2021-09-11 NOTE — Telephone Encounter (Signed)
BCBS clinical questions completed, signed, faxed with last office note to [facility]. Received confirmation.

## 2021-09-11 NOTE — Telephone Encounter (Signed)
Re: Emgality PA- This request has received a Unfavorable outcome. Please see letter faxed to your office for details on this adverse benefit determination. Have not received denial letter yet.

## 2021-09-13 ENCOUNTER — Other Ambulatory Visit: Payer: Self-pay

## 2021-09-13 ENCOUNTER — Encounter: Payer: Self-pay | Admitting: *Deleted

## 2021-09-13 MED ORDER — PANTOPRAZOLE SODIUM 40 MG PO TBEC: 40.0000 mg | DELAYED_RELEASE_TABLET | Freq: Every day | ORAL | 3 refills | Status: AC

## 2021-09-13 NOTE — Telephone Encounter (Signed)
Appeal letter signed, contacted patient via my chart to advise her of appeal ; she has to sign a consent for MD to represent her in appeal.

## 2021-09-13 NOTE — Telephone Encounter (Signed)
Yes we can try Pantoprazole 40mg . Thanks

## 2021-09-13 NOTE — Telephone Encounter (Addendum)
Have not received emgality denial letter, called [employer], spoke with Drusella who transferred me to plan [phone], opt 3, opt 3. Spoke with Katrina, pharmacy who stated letter was faxed on 09/11/21; she will fax again. Denied d/t not being used to treat chronic (Headache occurring on 15 or more days/month for more than 3 months) or episodic migraines ( migraine who have 0 to 14 headache days per month),  not known if criteria has been met. This is not considered a continuation of therapy because her policy was just effective 09/01/21.  Appeal letter written, on MD desk for review, signature.

## 2021-09-20 ENCOUNTER — Telehealth: Payer: Self-pay | Admitting: Pharmacy Technician

## 2021-09-20 NOTE — Telephone Encounter (Signed)
Patient Advocate Encounter  Prior Authorization for PANTOPRAZOLE has been approved.    PA# --- Effective dates: 9.20.23 through 9.18.24  Marycarmen Hagey B. CPhT P: [phone] F: [phone]   Received notification that prior authorization for PANTOPRAZOLE is required.   PA submitted on 9.20.23 [facility] Status is pending    Luciano Cutter, CPhT Patient Advocate Phone: [phone]

## 2021-09-21 ENCOUNTER — Telehealth: Payer: Self-pay

## 2021-09-21 ENCOUNTER — Other Ambulatory Visit (HOSPITAL_COMMUNITY): Payer: Self-pay

## 2021-09-21 NOTE — Telephone Encounter (Signed)
Yes, please see previous telephone encounter.

## 2021-09-21 NOTE — Telephone Encounter (Signed)
Pantoprazole needs PA. Has the pharmacy notified you about this yet?

## 2021-09-21 NOTE — Telephone Encounter (Signed)
Thank you. I appreciate your work. I will notify the patient.

## 2021-09-25 NOTE — Telephone Encounter (Signed)
E mailed consent form to patient; she signed, sent back. Emgality appeal letter, clinical notes and consent form faxed to [facility].Fax didn't go through with multiple attempts.

## 2021-09-28 NOTE — Telephone Encounter (Signed)
Fax to [employer] has failed multiple times daily since 09/25/21. I have placed appeal in the outgoing mail.

## 2021-10-12 NOTE — Telephone Encounter (Signed)
Emgality sample in drug frig for patient pick up on 10/16/21.

## 2021-10-12 NOTE — Telephone Encounter (Signed)
Called [employer]  to check status of Emgality PA, spoke with Tawylla B who stated they received appeal on 10/07/21, decision due 11/05/21. It will be received via letter to member and provider. Sent patient my chart to update her.

## 2021-10-16 NOTE — Telephone Encounter (Signed)
Patient picked up Emgality sample.  

## 2021-10-26 ENCOUNTER — Encounter (INDEPENDENT_AMBULATORY_CARE_PROVIDER_SITE_OTHER): Payer: BC Managed Care – PPO

## 2021-10-26 DIAGNOSIS — U071 COVID-19: Secondary | ICD-10-CM | POA: Diagnosis not present

## 2021-10-26 MED ORDER — PAXLOVID (300/100) 20 X 150 MG & 10 X 100MG PO TBPK: 3.0000 | ORAL_TABLET | Freq: Two times a day (BID) | ORAL | 0 refills | Status: AC

## 2021-10-26 NOTE — Addendum Note (Signed)
Addended by: Palmer Callas on: 10/26/2021 02:05 PM   Modules accepted: Orders

## 2021-10-26 NOTE — Telephone Encounter (Signed)
Please see the MyChart message reply(ies) for my assessment and plan.    This patient gave consent for this Medical Advice Message and is aware that it may result in a bill to [employer], as well as the possibility of receiving a bill for a co-payment or deductible. They are an established patient, but are not seeking medical advice exclusively about a problem treated during an in person or video visit in the last seven days. I did not recommend an in person or video visit within seven days of my reply.    I spent a total of 10 minutes cumulative time within 7 days through [employer].  Janene Madeira, NP

## 2021-11-06 NOTE — Telephone Encounter (Addendum)
Called [employer] to check emgality appeal, spoke with Mariann Laster who stated decision mailed out 11/03/21, denied due to no documentation of baseline of more than 4 migraines a month. We should receive letter soon; patient was mailed a letter.  Sent to MD.

## 2021-11-07 NOTE — Telephone Encounter (Signed)
Per Dr Leta Baptist,  Patient used to have 8 HA per month, then started emgality, now only 1 headache per month.   Called [employer], spoke with [place] intake who transferred me to appeals analyst, ph [phone]. She transferred call, advised if no answer LVM. No answer, so I left VM with information, requested call back.

## 2021-11-14 ENCOUNTER — Encounter: Payer: Self-pay | Admitting: Diagnostic Neuroimaging

## 2021-11-14 NOTE — Telephone Encounter (Signed)
Given the PA that was already submitted the PA was cancelled for the patient.  Attempted to call [employer] and ask if another PA could be initiated since I believe the answers were incorrectly answered from the beginning. They stated we couldn't and forwarded me to Sharilyn Sites the appeals analyst.  I LVM for her to call back providing the information of the patient and appeal id. Will await return call.

## 2021-11-14 NOTE — Telephone Encounter (Signed)
I have resubmitted a new PA on CMM/BCBS VIF:[id number] Will await determination

## 2021-11-15 ENCOUNTER — Telehealth: Payer: Self-pay | Admitting: Neurology

## 2021-11-15 NOTE — Telephone Encounter (Signed)
PA completed on CMM/BCBS [place] KEY:[id number] Will await determination

## 2021-11-16 ENCOUNTER — Other Ambulatory Visit: Payer: Self-pay | Admitting: Neurology

## 2021-11-16 MED ORDER — AJOVY 225 MG/1.5ML ~~LOC~~ SOAJ: 1.5000 mL | SUBCUTANEOUS | 5 refills | Status: DC

## 2021-11-20 NOTE — Telephone Encounter (Signed)
PA approved for the patient Effective from 11/15/2021 through 02/06/2022.

## 2021-12-03 ENCOUNTER — Other Ambulatory Visit: Payer: Self-pay | Admitting: Diagnostic Neuroimaging

## 2021-12-05 ENCOUNTER — Encounter: Payer: Self-pay | Admitting: Diagnostic Neuroimaging

## 2021-12-06 ENCOUNTER — Other Ambulatory Visit: Payer: Self-pay

## 2021-12-06 DIAGNOSIS — Z113 Encounter for screening for infections with a predominantly sexual mode of transmission: Secondary | ICD-10-CM

## 2021-12-06 DIAGNOSIS — B20 Human immunodeficiency virus [HIV] disease: Secondary | ICD-10-CM

## 2021-12-06 DIAGNOSIS — Z79899 Other long term (current) drug therapy: Secondary | ICD-10-CM

## 2021-12-08 ENCOUNTER — Other Ambulatory Visit: Payer: Self-pay

## 2021-12-08 ENCOUNTER — Other Ambulatory Visit: Payer: BC Managed Care – PPO

## 2021-12-08 DIAGNOSIS — Z79899 Other long term (current) drug therapy: Secondary | ICD-10-CM | POA: Diagnosis not present

## 2021-12-08 DIAGNOSIS — B20 Human immunodeficiency virus [HIV] disease: Secondary | ICD-10-CM

## 2021-12-08 DIAGNOSIS — Z113 Encounter for screening for infections with a predominantly sexual mode of transmission: Secondary | ICD-10-CM

## 2021-12-10 LAB — LIPID PANEL
Cholesterol: 98 mg/dL (ref <200)
HDL: 48 mg/dL — ABNORMAL LOW (ref >=50)
LDL Cholesterol (Calc): 33 mg/dL (calc)
Non-HDL Cholesterol (Calc): 50 mg/dL (calc) (ref <130)
Total CHOL/HDL Ratio: 2 (calc) (ref <5.0)
Triglycerides: 91 mg/dL (ref <150)

## 2021-12-10 LAB — COMPLETE METABOLIC PANEL WITH GFR
AG Ratio: 2.1 (calc) (ref 1.0–2.5)
ALT: 23 U/L (ref 6–29)
AST: 27 U/L (ref 10–35)
Albumin: 4.7 g/dL (ref 3.6–5.1)
Alkaline phosphatase (APISO): 52 U/L (ref 37–153)
BUN: 15 mg/dL (ref 7–25)
CO2: 30 mmol/L (ref 20–32)
Calcium: 9.7 mg/dL (ref 8.6–10.4)
Chloride: 103 mmol/L (ref 98–110)
Creat: 0.99 mg/dL (ref 0.50–1.05)
Globulin: 2.2 g/dL (calc) (ref 1.9–3.7)
Glucose, Bld: 122 mg/dL — ABNORMAL HIGH (ref 65–99)
Potassium: 4 mmol/L (ref 3.5–5.3)
Sodium: 141 mmol/L (ref 135–146)
Total Bilirubin: 0.4 mg/dL (ref 0.2–1.2)
Total Protein: 6.9 g/dL (ref 6.1–8.1)
eGFR: 65 mL/min/{1.73_m2} (ref >=60)

## 2021-12-10 LAB — CBC WITH DIFFERENTIAL/PLATELET
Absolute Monocytes: 170 cells/uL — ABNORMAL LOW (ref 200–950)
Basophils Absolute: 30 cells/uL (ref 0–200)
Basophils Relative: 0.6 %
Eosinophils Absolute: 140 cells/uL (ref 15–500)
Eosinophils Relative: 2.8 %
HCT: 44.8 % (ref 35.0–45.0)
Hemoglobin: 15.3 g/dL (ref 11.7–15.5)
Lymphs Abs: 545 cells/uL — ABNORMAL LOW (ref 850–3900)
MCH: 31.4 pg (ref 27.0–33.0)
MCHC: 34.2 g/dL (ref 32.0–36.0)
MCV: 92 fL (ref 80.0–100.0)
MPV: 9.8 fL (ref 7.5–12.5)
Monocytes Relative: 3.4 %
Neutro Abs: 4115 cells/uL (ref 1500–7800)
Neutrophils Relative %: 82.3 %
Platelets: 236 10*3/uL (ref 140–400)
RBC: 4.87 10*6/uL (ref 3.80–5.10)
RDW: 12.5 % (ref 11.0–15.0)
Total Lymphocyte: 10.9 %
WBC: 5 10*3/uL (ref 3.8–10.8)

## 2021-12-10 LAB — HIV-1 RNA QUANT-NO REFLEX-BLD
HIV 1 RNA Quant: NOT DETECTED Copies/mL
HIV-1 RNA Quant, Log: NOT DETECTED Log cps/mL

## 2021-12-10 LAB — RPR: RPR Ser Ql: NONREACTIVE

## 2021-12-10 LAB — T-HELPER CELLS (CD4) COUNT (NOT AT ARMC)
Absolute CD4: 86 cells/uL — ABNORMAL LOW (ref 490–1740)
CD4 T Helper %: 14 % — ABNORMAL LOW (ref 30–61)
Total lymphocyte count: 598 cells/uL — ABNORMAL LOW (ref 850–3900)

## 2021-12-11 ENCOUNTER — Encounter (INDEPENDENT_AMBULATORY_CARE_PROVIDER_SITE_OTHER): Payer: BC Managed Care – PPO

## 2021-12-11 DIAGNOSIS — Z712 Person consulting for explanation of examination or test findings: Secondary | ICD-10-CM | POA: Diagnosis not present

## 2021-12-11 NOTE — Telephone Encounter (Signed)
Please see the MyChart message reply(ies) for my assessment and plan.    This patient gave consent for this Medical Advice Message and is aware that it may result in a bill to their insurance company, as well as the possibility of receiving a bill for a co-payment or deductible. They are an established patient, but are not seeking medical advice exclusively about a problem treated during an in person or video visit in the last seven days. I did not recommend an in person or video visit within seven days of my reply.    I spent a total of 8 minutes cumulative time within 7 days through MyChart messaging.  Valdez Brannan, NP   

## 2022-01-08 ENCOUNTER — Ambulatory Visit: Payer: 59 | Admitting: Infectious Diseases

## 2022-01-08 ENCOUNTER — Encounter: Payer: Self-pay | Admitting: Infectious Diseases

## 2022-01-12 ENCOUNTER — Ambulatory Visit (INDEPENDENT_AMBULATORY_CARE_PROVIDER_SITE_OTHER): Payer: BC Managed Care – PPO | Admitting: Infectious Diseases

## 2022-01-12 ENCOUNTER — Encounter: Payer: Self-pay | Admitting: Infectious Diseases

## 2022-01-12 ENCOUNTER — Other Ambulatory Visit: Payer: Self-pay

## 2022-01-12 VITALS — BP 131/78 | HR 74 | Ht 65.0 in | Wt 152.6 lb

## 2022-01-12 DIAGNOSIS — R5383 Other fatigue: Secondary | ICD-10-CM

## 2022-01-12 DIAGNOSIS — B977 Papillomavirus as the cause of diseases classified elsewhere: Secondary | ICD-10-CM | POA: Diagnosis not present

## 2022-01-12 DIAGNOSIS — R87610 Atypical squamous cells of undetermined significance on cytologic smear of cervix (ASC-US): Secondary | ICD-10-CM | POA: Diagnosis not present

## 2022-01-12 DIAGNOSIS — G9332 Myalgic encephalomyelitis/chronic fatigue syndrome: Secondary | ICD-10-CM | POA: Diagnosis not present

## 2022-01-12 DIAGNOSIS — Z21 Asymptomatic human immunodeficiency virus [HIV] infection status: Secondary | ICD-10-CM

## 2022-01-12 DIAGNOSIS — D8989 Other specified disorders involving the immune mechanism, not elsewhere classified: Secondary | ICD-10-CM

## 2022-01-12 NOTE — Progress Notes (Signed)
Patient: Stacy Watkins  DOB: 1960/07/28 MRN: [id number] PCP: Carol Ada, MD     Subjective:  Brief Narrative: Stacy Watkins is a 62 y.o. female with HIV, AIDS+ Dx 05/2019 CD4 nadir < 35 HIV Risk: sexual OI: oral thrush Intake Labs: Hep B sAg (-), sAb (-), cAb (-), Hep A Ab (-), Hep C Ab (-) Quantiferon (-) HLA B*5701 (pending)  Previous Regimens: Biktarvy >> suppressed  Rukobia added 09/2020 to try to get CD4 up Dovato   Genotype:  06-2019 - wildtype    CC: HIV follow up care      HPI:   Stacy Watkins is here for follow up on HIV care.  She has her partner with her today and permission to discuss all aspects of her health.  We have been working on fatigue/exhaustion. Interested in sleep study - her daughter has severe sleep apnea and now she wonders if she may benefit from a repeat study. Had one about 18 years ago that was normal then.  Other than that no major changes to fill me in on.  Modafinil was not incredibly helpful and she is no longer taking this.  Is CD4 drop related to HPV that is still lingering? Or something else (down from 150 >> 86). Still with 3 or 4 little bumps and due for follow up with Dr. Marcello Moores soon.  Sexual health questions as it pertains to HIV transmission and HPV transmission.   Review of Systems  Constitutional:  Positive for activity change and fatigue. Negative for unexpected weight change.  Neurological:  Positive for headaches.    Past Medical History:  Diagnosis Date   Anxiety    Depression    Depression with anxiety    HIV disease ([place]) 06/08/2019   Hyperlipidemia    Hypertension    Migraine    PMB (postmenopausal bleeding)    Seasonal allergies    Situational stress    Vaginal Pap smear, abnormal    Wears glasses     Outpatient Medications Prior to Visit  Medication Sig Dispense Refill   ALPRAZolam (XANAX) 0.5 MG tablet TAKE 1/2   1 TABLET TWICE A DAY AS NEEDED     amLODipine (NORVASC) 2.5 MG tablet daily.      aspirin 81 MG tablet Take 81 mg by mouth daily.     atenolol (TENORMIN) 25 MG tablet Take 1 tablet (25 mg total) by mouth daily. 14 tablet 0   atenolol (TENORMIN) 50 MG tablet TAKE ONE TABLET BY MOUTH DAILY. 90 tablet 1   CALCIUM PO Take by mouth every evening.     Cholecalciferol (VITAMIN D PO) Take by mouth every evening.     clonazePAM (KLONOPIN) 0.5 MG tablet Take 0.5 mg by mouth every evening.     cyclobenzaprine (FLEXERIL) 10 MG tablet TAKE ONE TABLET BY MOUTH 3 TIMES A DAY AS NEEDED.     dolutegravir-lamiVUDine (DOVATO) 50-300 MG tablet Take 1 tablet by mouth daily. 30 tablet 11   estradiol (ESTRACE) 2 MG tablet Take 2 mg by mouth daily.     Fremanezumab-vfrm (AJOVY) 225 MG/1.5ML SOAJ Inject 1.5 mLs into the skin every 30 (thirty) days. 1.5 mL 5   imiquimod (ALDARA) 5 % cream Apply topically 3 (three) times a week. Apply until total clearance or maximum of 16 weeks 24 each 5   Melatonin 10 MG CAPS See admin instructions.     modafinil (PROVIGIL) 200 MG tablet Take 1 tablet (200 mg total) by mouth daily.  30 tablet 2   naproxen (NAPROSYN) 500 MG tablet Take 500 mg by mouth 2 (two) times daily as needed.     nortriptyline (PAMELOR) 50 MG capsule TAKE 1 CAPSULE BY MOUTH EVERYDAY AT BEDTIME 90 capsule 1   pantoprazole (PROTONIX) 40 MG tablet Take 1 tablet (40 mg total) by mouth daily before breakfast. 90 tablet 3   Probiotic Product (ALIGN PO) Take 1 capsule by mouth 2 (two) times daily. PM AND QHS  PROBIOTIC 25 BILLION PER STRAIN     progesterone (PROMETRIUM) 200 MG capsule Take 200 mg by mouth at bedtime.     rizatriptan (MAXALT) 10 MG tablet Take 1 tablet (10 mg total) by mouth as needed for migraine. May repeat in 2 hours if needed 10 tablet 6   zolpidem (AMBIEN) 10 MG tablet Take 5-10 mg by mouth at bedtime as needed.     rosuvastatin (CRESTOR) 10 MG tablet TAKE 1 TABLET BY MOUTH EVERY DAY 90 tablet 1   sulfamethoxazole-trimethoprim (BACTRIM) 400-80 MG tablet TAKE 1 TABLET BY MOUTH EVERY  DAY 30 tablet 3   buPROPion ER (WELLBUTRIN SR) 100 MG 12 hr tablet 1 tablet in the morning     modafinil (PROVIGIL) 100 MG tablet Take 1 tablet (100 mg total) by mouth daily for 3 days, THEN 2 tablets (200 mg total) daily for 27 days. 57 tablet 0   Facility-Administered Medications Prior to Visit  Medication Dose Route Frequency Provider Last Rate Last Admin   albuterol (VENTOLIN HFA) 108 (90 Base) MCG/ACT inhaler 2 puff  2 puff Inhalation Once PRN [place] Callas, NP       diphenhydrAMINE (BENADRYL) injection 50 mg  50 mg Intramuscular Once PRN [place] Callas, NP       EPINEPHrine (EPI-PEN) injection 0.3 mg  0.3 mg Intramuscular Once PRN [place] Callas, NP         Allergies  Allergen Reactions   Dust Mite Extract Other (See Comments)   Mold Extract  [Trichophyton] Other (See Comments)   Trazodone Hcl Other (See Comments)    NOT SURE REACTION    Social History   Tobacco Use   Smoking status: Never   Smokeless tobacco: Never  Vaping Use   Vaping Use: Never used  Substance Use Topics   Alcohol use: No   Drug use: No     Objective:   Vitals:   01/12/22 1127  BP: 131/78  Pulse: 74  Weight: 152 lb 9.6 oz (69.2 kg)  Height: 5' 5"$  (1.651 m)   Body mass index is 25.39 kg/m.   Physical Exam Vitals reviewed.  Constitutional:      Appearance: Normal appearance. She is not ill-appearing.  HENT:     Mouth/Throat:     Mouth: Mucous membranes are moist.     Pharynx: Oropharynx is clear.  Eyes:     General: No scleral icterus. Cardiovascular:     Rate and Rhythm: Normal rate.  Pulmonary:     Effort: Pulmonary effort is normal.  Neurological:     Mental Status: She is oriented to person, place, and time.  Psychiatric:        Mood and Affect: Mood normal.        Thought Content: Thought content normal.      Lab Results: Lab Results  Component Value Date   WBC 5.0 12/08/2021   HGB 15.3 12/08/2021   HCT 44.8 12/08/2021   MCV 92.0 12/08/2021   PLT 236  12/08/2021  Lab Results  Component Value Date   CREATININE 0.99 12/08/2021   BUN 15 12/08/2021   NA 141 12/08/2021   K 4.0 12/08/2021   CL 103 12/08/2021   CO2 30 12/08/2021    Lab Results  Component Value Date   ALT 23 12/08/2021   AST 27 12/08/2021   BILITOT 0.4 12/08/2021     Assessment & Plan:   Problem List Items Addressed This Visit       Unprioritized   Asymptomatic HIV infection, CD4 <=200 ([place])    Virologically well-controlled with consistently undetectable viral loads.  She has been on treatment now. She has not had significantly robust immune reconstitution over the last 2.5 years on treatment.  We tried adding twice daily Rukobia in September 2022.  I think she continue this for about a year with CD4 peak slightly over 150.  Not sure what to think about her acute drop back to less than 100.  She had acute mild COVID infection in late October 2023.  I wonder if we are seeing a drop due to discontinuation of Rukobia.  I cannot necessarily prove that.  Certainly willing to represcribe this for her if she would like to but I do not think she has felt tremendously better whether her CD4 is 50 or 150 unfortunately.  If there is concern for any vaginal or anorectal lesions I would support a biopsy if felt to be medically indicated even with chronically low CD4 count.  The mainstay of her risk reduction is keeping her viral load suppressed and undetectable.  Discussed U=U concept in addition to safe sex counseling and prevention of other STIs.   Will plan to repeat lab check only in 3 months.  She is [place] moving out of state.  We are happy to help support Stacy Watkins with what needs she has whether that be continue care here or transfer of care locally.       HPV in female    Discussed with Stacy Watkins and her partner that it is pretty impossible to prevent transmission of HPV from person-to-person.  Most adults after the age of 17 have already been infected with some strain of  it.  Condoms could lessen the risk of transmission though not completely effective in this.  She continues to follow with Dr. Marcello Moores and her GYN team.      Exhaustion    I think repeating a sleep study is reasonable to investigate reversible causes of her exhaustion and fatigue.  Would recommend to arrange with her PCP.       ASCUS of cervix with negative high risk HPV    Follow-up with GYN.      Chronic fatigue and immune dysfunction syndrome (HCC) - Primary    Stacy Watkins continues to try to find improvement in her fatigue and exhaustion through researching and experimenting with immune boosting supplements.  Adding [place] tail mushroom 400 mg daily and considering Reishi mushrooms as well. I completely support any efforts she would like to put forth to try to feel better.  Mildly ask is for her to continue taking her Dovato and ensure that she properly separate supplements which she is well versed in doing.      Janene Madeira, MSN, NP-C [facility] [place] Pager: [phone] Office: [phone]

## 2022-01-13 ENCOUNTER — Other Ambulatory Visit: Payer: Self-pay | Admitting: Infectious Diseases

## 2022-01-13 ENCOUNTER — Other Ambulatory Visit: Payer: Self-pay | Admitting: Cardiology

## 2022-01-13 DIAGNOSIS — E7800 Pure hypercholesterolemia, unspecified: Secondary | ICD-10-CM

## 2022-01-13 DIAGNOSIS — B20 Human immunodeficiency virus [HIV] disease: Secondary | ICD-10-CM

## 2022-01-16 ENCOUNTER — Encounter: Payer: Self-pay | Admitting: Diagnostic Neuroimaging

## 2022-01-17 NOTE — Telephone Encounter (Signed)
PA rq sent to pharm team

## 2022-01-22 ENCOUNTER — Telehealth: Payer: Self-pay

## 2022-01-22 ENCOUNTER — Other Ambulatory Visit (HOSPITAL_COMMUNITY): Payer: Self-pay

## 2022-01-22 NOTE — Telephone Encounter (Signed)
Pharmacy Patient Advocate Encounter   Received notification from [facility] that prior authorization for Emgality 120MG /ML auto-injectors (migraine) is required/requested.    PA submitted on 01/22/2022 to (ins) [employer] [place]  via CoverMyMeds Key [id number] Status is pending  Pharmacy Patient Advocate Encounter   Received notification from [facility] that prior authorization for Emgality 120MG /ML auto-injectors (migraine) is required/requested.    PA submitted on 01/22/2022 to (ins) [place]  via CoverMyMeds Key  [place] Status is pending

## 2022-01-22 NOTE — Telephone Encounter (Signed)
Pharmacy Patient Advocate Encounter   Received notification from [facility] that prior authorization for  AJOVY (fremanezumab-vfrm) injection 225MG /1.5ML auto-injectorsis   PA submitted on 01/22/2022 to (ins) [employer] via CoverMyMeds Key  [id number]  Status is pending  Pharmacy Patient Advocate Encounter  Per [employer] [place]:  Prior Authorization for AJOVY (fremanezumab-vfrm) injection 225MG /1.5ML auto-injectors has been approved.   PA# [id number] Effective dates: 01/22/2022 through 01/22/23  Pharmacy Patient Advocate Encounter   Received notification from [facility] that prior authorization for AJOVY (fremanezumab-vfrm) injection 225MG /1.5ML auto-injectors is required/requested. PA submitted on 01/22/2022 to (ins) [place] via CoverMyMeds Key [id number] Status is pending

## 2022-01-22 NOTE — Telephone Encounter (Signed)
Pharmacy Patient Advocate Encounter  PER [employer] [place]  Prior Authorization for Stacy Watkins 120MG /ML auto-injectors (migraine) has been approved.    PA# [id number] Effective dates: 01/22/2022 through 01/22/2023.  WE ARE STILL WAITING TO HEAR BACK FROM [employer] [place].

## 2022-01-22 NOTE — Telephone Encounter (Signed)
Please submit urgent PA for Emgality, Key was created and faxed last week.

## 2022-01-24 ENCOUNTER — Other Ambulatory Visit (HOSPITAL_COMMUNITY): Payer: Self-pay

## 2022-01-24 NOTE — Telephone Encounter (Signed)
Pharmacy Patient Advocate Encounter  Prior Authorization for AJOVY (fremanezumab-vfrm) injection 225MG /1.5ML auto-injectors has been approved.    PA# Did not provide a PA# Effective dates: 01/22/2022 through 01/21/2023 This was approved from [place] 02-13-22  per test billing

## 2022-02-05 ENCOUNTER — Telehealth: Payer: BC Managed Care – PPO | Admitting: Diagnostic Neuroimaging

## 2022-02-09 NOTE — Assessment & Plan Note (Signed)
Discussed with [name] and her partner that it is pretty impossible to prevent transmission of HPV from person-to-person.  Most adults after the age of 42 have already been infected with some strain of it.  Condoms could lessen the risk of transmission though not completely effective in this.  She continues to follow with Dr. Marcello Moores and her GYN team.

## 2022-02-09 NOTE — Assessment & Plan Note (Signed)
Follow-up with GYN.

## 2022-02-09 NOTE — Assessment & Plan Note (Signed)
Stacy Watkins continues to try to find improvement in her fatigue and exhaustion through researching and experimenting with immune boosting supplements.  Adding [place] tail mushroom 400 mg daily and considering Reishi mushrooms as well. I completely support any efforts she would like to put forth to try to feel better.  Mildly ask is for her to continue taking her Dovato and ensure that she properly separate supplements which she is well versed in doing.

## 2022-02-09 NOTE — Assessment & Plan Note (Signed)
I think repeating a sleep study is reasonable to investigate reversible causes of her exhaustion and fatigue.  Would recommend to arrange with her PCP.

## 2022-02-09 NOTE — Assessment & Plan Note (Addendum)
Virologically well-controlled with consistently undetectable viral loads.  She has been on treatment now. She has not had significantly robust immune reconstitution over the last 2.5 years on treatment.  We tried adding twice daily Rukobia in September 2022.  I think she continue this for about a year with CD4 peak slightly over 150.  Not sure what to think about her acute drop back to less than 100.  She had acute mild COVID infection in late October 2023.  I wonder if we are seeing a drop due to discontinuation of Rukobia.  I cannot necessarily prove that.  Certainly willing to represcribe this for her if she would like to but I do not think she has felt tremendously better whether her CD4 is 50 or 150 unfortunately.  If there is concern for any vaginal or anorectal lesions I would support a biopsy if felt to be medically indicated even with chronically low CD4 count.  The mainstay of her risk reduction is keeping her viral load suppressed and undetectable.  Discussed U=U concept in addition to safe sex counseling and prevention of other STIs.   Will plan to repeat lab check only in 3 months.  She is considering moving out of state.  We are happy to help support Stacy Watkins with what needs she has whether that be continue care here or transfer of care locally.

## 2022-02-27 NOTE — Progress Notes (Unsigned)
   Virtual Visit via Video Note  I connected with Stacy Watkins on 02/27/22 at  2:15 PM EST by a video enabled telemedicine application and verified that I am speaking with the correct person using two identifiers.  Location: Patient: at her home Provider: in the office    I discussed the limitations of evaluation and management by telemedicine and the availability of in person appointments. The patient expressed understanding and agreed to proceed.  History of Present Illness: 02/28/22 SS: Switched from [place] to [place] back on November 2023 due to insurance issues. Last week switched back to [place], she got new insurance. She prefers to stay on Emgality, it has worked for her. Last few weeks has had a few migraines during the night. Her PCP is going to refer her for sleep study, due to years long insomnia. Hasn't slept through the night for decades. She tried to cut back atenolol to 25 mg daily but had more headaches so went back to 50 mg. Still on nortriptyline 50 mg at bedtime. Has Maxalt PRN, takes 1-2 times a month since Emgality. Is moving to [facility] to [place], is getting remarried.   06/23/21 Dr. Leta Baptist: 62 year old female here for evaluation of fatigue, weakness, headaches.   Long history of migraine headaches without aura with right retro-orbital pain, throbbing sensation, neck pain, sensitive to light and sound.  She has done well on combination of Emgality, nortriptyline, atenolol and rizatriptan.  Averaging 1 headache per month, with good results with rizatriptan.   Has had chronic fatigue for at least 6 to 8 years.  She was diagnosed with HIV about 2 years ago.  CD4 was undetectable at that time but now has improved to 110 on treatment.   Also has had chronic stress related to marital problems in the past, but this has improved since divorce.  Patient on mood stabilizers and antianxiety medications.   Observations/Objective: Via virtual visit is alert and oriented, speech is  clear and concise, facial symmetry noted, moves about freely  Assessment and Plan: 1.  Chronic migraine headache -I will reorder Emgality for migraine prevention, she did a few months of Ajovy, but then her new insurance preferred [employer], she prefers this -Continue atenolol, nortriptyline, try to wean off atenolol reported worsening headache -Continue Maxalt as needed for acute headache -Is planning to relocate to [place], we will be happy to place a referral to neurology for continue to see for an office visit here -I refilled her medications for 6 months, at that time can reach out for another 6 months of refills  Follow Up Instructions: She will reach out to schedule depending on if she establishes care in [place]   I discussed the assessment and treatment plan with the patient. The patient was provided an opportunity to ask questions and all were answered. The patient agreed with the plan and demonstrated an understanding of the instructions.   The patient was advised to call back or seek an in-person evaluation if the symptoms worsen or if the condition fails to improve as anticipated.  Evangeline Dakin, DNP  [facility] [address] [place], [place] [address] [phone]

## 2022-02-28 ENCOUNTER — Telehealth (INDEPENDENT_AMBULATORY_CARE_PROVIDER_SITE_OTHER): Payer: BC Managed Care – PPO | Admitting: Neurology

## 2022-02-28 DIAGNOSIS — G43709 Chronic migraine without aura, not intractable, without status migrainosus: Secondary | ICD-10-CM

## 2022-02-28 DIAGNOSIS — B20 Human immunodeficiency virus [HIV] disease: Secondary | ICD-10-CM

## 2022-02-28 MED ORDER — EMGALITY 120 MG/ML ~~LOC~~ SOAJ: 120.0000 mg | SUBCUTANEOUS | 11 refills | Status: DC

## 2022-02-28 MED ORDER — NORTRIPTYLINE HCL 50 MG PO CAPS: ORAL_CAPSULE | ORAL | 1 refills | Status: DC

## 2022-02-28 MED ORDER — ATENOLOL 50 MG PO TABS: 50.0000 mg | ORAL_TABLET | Freq: Every day | ORAL | 1 refills | Status: DC

## 2022-02-28 MED ORDER — RIZATRIPTAN BENZOATE 10 MG PO TABS: 10.0000 mg | ORAL_TABLET | ORAL | 6 refills | Status: AC | PRN

## 2022-02-28 NOTE — Patient Instructions (Addendum)
Great to meet you today! I refilled your prescriptions, let me know in 6 months I can send another round of refills We will need to see you in about a year if you are not reestablished at a neurology office in [place] Thanks!!

## 2022-03-06 ENCOUNTER — Other Ambulatory Visit (HOSPITAL_COMMUNITY): Payer: Self-pay

## 2022-03-07 ENCOUNTER — Other Ambulatory Visit: Payer: Self-pay | Admitting: Infectious Diseases

## 2022-03-07 DIAGNOSIS — B20 Human immunodeficiency virus [HIV] disease: Secondary | ICD-10-CM

## 2022-03-09 ENCOUNTER — Other Ambulatory Visit: Payer: Medicaid Other

## 2022-03-09 ENCOUNTER — Other Ambulatory Visit: Payer: Self-pay

## 2022-03-09 DIAGNOSIS — B20 Human immunodeficiency virus [HIV] disease: Secondary | ICD-10-CM

## 2022-03-09 NOTE — Progress Notes (Unsigned)
HPI: FU CAD. Stress echo 6/15 normal. Admitted March 2018 with syncope resulting in nasal fracture. Episode was felt likely to have been orthostatic mediated. Troponins were normal. Nuclear study 3/18 showed EF 73 and normal perfusion. Echocardiogram April 2018 showed normal LV function and grade 1 diastolic dysfunction. Monitor 3/18  showed sinus rhythm with no significant arrhythmia.  Cardiac CTA June 2021 showed calcium score of 0 and mild noncalcified plaque in the proximal LAD at 25 to 49%.  FFR was negative.  Since last seen, the patient denies any dyspnea on exertion, orthopnea, PND, pedal edema, palpitations, syncope or chest pain.   Current Outpatient Medications  Medication Sig Dispense Refill   ALPRAZolam (XANAX) 0.5 MG tablet TAKE 1/2   1 TABLET TWICE A DAY AS NEEDED     amLODipine (NORVASC) 2.5 MG tablet daily.     aspirin 81 MG tablet Take 81 mg by mouth daily.     atenolol (TENORMIN) 50 MG tablet Take 1 tablet (50 mg total) by mouth daily. 90 tablet 1   CALCIUM PO Take by mouth every evening.     Cholecalciferol (VITAMIN D PO) Take by mouth every evening.     clonazePAM (KLONOPIN) 0.5 MG tablet Take 0.5 mg by mouth every evening.     dolutegravir-lamiVUDine (DOVATO) 50-300 MG tablet Take 1 tablet by mouth daily. 30 tablet 11   estradiol (ESTRACE) 2 MG tablet Take 2 mg by mouth daily.     Galcanezumab-gnlm (EMGALITY) 120 MG/ML SOAJ Inject 120 mg into the skin every 30 (thirty) days. 1.12 mL 11   imiquimod (ALDARA) 5 % cream Apply topically 3 (three) times a week. Apply until total clearance or maximum of 16 weeks 24 each 5   Melatonin 10 MG CAPS See admin instructions.     naproxen (NAPROSYN) 500 MG tablet Take 500 mg by mouth 2 (two) times daily as needed.     nortriptyline (PAMELOR) 50 MG capsule TAKE 1 CAPSULE BY MOUTH EVERYDAY AT BEDTIME 90 capsule 1   pantoprazole (PROTONIX) 40 MG tablet Take 1 tablet (40 mg total) by mouth daily before breakfast. 90 tablet 3    Probiotic Product (ALIGN PO) Take 1 capsule by mouth 2 (two) times daily. PM AND QHS  PROBIOTIC 25 BILLION PER STRAIN     progesterone (PROMETRIUM) 200 MG capsule Take 200 mg by mouth at bedtime.     rizatriptan (MAXALT) 10 MG tablet Take 1 tablet (10 mg total) by mouth as needed for migraine. May repeat in 2 hours if needed 10 tablet 6   rosuvastatin (CRESTOR) 10 MG tablet TAKE 1 TABLET BY MOUTH EVERY DAY 90 tablet 3   sulfamethoxazole-trimethoprim (BACTRIM) 400-80 MG tablet TAKE 1 TABLET BY MOUTH EVERY DAY 30 tablet 3   zolpidem (AMBIEN) 10 MG tablet Take 5-10 mg by mouth at bedtime as needed.     Current Facility-Administered Medications  Medication Dose Route Frequency Provider Last Rate Last Admin   albuterol (VENTOLIN HFA) 108 (90 Base) MCG/ACT inhaler 2 puff  2 puff Inhalation Once PRN [place] Callas, NP       diphenhydrAMINE (BENADRYL) injection 50 mg  50 mg Intramuscular Once PRN [place] Callas, NP       EPINEPHrine (EPI-PEN) injection 0.3 mg  0.3 mg Intramuscular Once PRN  Callas, NP         Past Medical History:  Diagnosis Date   Anxiety    Depression    Depression with anxiety  HIV disease ([place]) 06/08/2019   Hyperlipidemia    Hypertension    Migraine    PMB (postmenopausal bleeding)    Seasonal allergies    Situational stress    Vaginal Pap smear, abnormal    Wears glasses     Past Surgical History:  Procedure Laterality Date   CARPAL TUNNEL RELEASE Right 2016   CARPAL TUNNEL RELEASE Left 10/11/2014   Procedure: LEFT  ENDOSCOPIC CARPAL TUNNEL RELEASE;  Surgeon: Milly Jakob, MD;  Location: [facility];  Service: Orthopedics;  Laterality: Left;   CESAREAN SECTION     CHOLECYSTECTOMY  2017   LAPAROSCOPIC   COLONSCOPY  LAST DONE 2019   DILATATION & CURETTAGE/HYSTEROSCOPY WITH MYOSURE N/A 05/20/2020   Procedure: DILATATION & CURETTAGE/HYSTEROSCOPY;  Surgeon: Everlene Farrier, MD;  Location: [facility];  Service:  Gynecology;  Laterality: N/A;   DILATION AND CURETTAGE OF UTERUS     X 1   EYE SURGERY  AGE 76   TRIGGER FINGER RELEASE Right 12/09/2017   Procedure: RELEASE TRIGGER FINGER/A-1 PULLEY;  Surgeon: Milly Jakob, MD;  Location: [facility];  Service: Orthopedics;  Laterality: Right;    Social History   Socioeconomic History   Marital status: Divorced    Spouse name: Not on file   Number of children: 5   Years of education: 16   Highest education level: Not on file  Occupational History   Not on file  Tobacco Use   Smoking status: Never   Smokeless tobacco: Never  Vaping Use   Vaping Use: Never used  Substance and Sexual Activity   Alcohol use: No   Drug use: No   Sexual activity: Not Currently    Comment: declined condoms  Other Topics Concern   Not on file  Social History Narrative   Lives with husband in a one story home.  Has 5 children (2 biological and 3 adopted).  Works as a Probation officer.  Education: college.    Social Determinants of Health   Financial Resource Strain: Not on file  Food Insecurity: Not on file  Transportation Needs: Not on file  Physical Activity: Not on file  Stress: Not on file  Social Connections: Not on file  Intimate Partner Violence: Not on file    Family History  Problem Relation Age of Onset   Heart failure Mother    Cancer Father    CAD Father        MI in his 68s   Heart attack Father    Prostate cancer Father 71   Colon cancer Father 91   CAD Sister        Died at age 38 of MI   Heart attack Sister    Hypertension Brother    Migraines Neg Hx    Esophageal cancer Neg Hx    Rectal cancer Neg Hx    Stomach cancer Neg Hx     ROS: no fevers or chills, productive cough, hemoptysis, dysphasia, odynophagia, melena, hematochezia, dysuria, hematuria, rash, seizure activity, orthopnea, PND, pedal edema, claudication. Remaining systems are negative.  Physical Exam: Well-developed well-nourished in no acute distress.  Skin  is warm and dry.  HEENT is normal.  Neck is supple.  Chest is clear to auscultation with normal expansion.  Cardiovascular exam is regular rate and rhythm.  Abdominal exam nontender or distended. No masses palpated. Extremities show no edema. neuro grossly intact  ECG-normal sinus rhythm at a rate of 78, nonspecific ST changes.  Personally reviewed  A/P  1 coronary artery disease-patient doing well with no chest pain.  Continue aspirin and statin.  2 hypertension-patient's blood pressure is controlled.  Continue present medical regimen and follow-up.  3 hyperlipidemia-continue statin.  4 history of syncope-previously felt to be orthostatic mediated with no recurrences.  5 palpitations-symptoms are controlled.  Continue beta-blocker.  Kirk Ruths, MD

## 2022-03-12 ENCOUNTER — Ambulatory Visit: Payer: BC Managed Care – PPO | Attending: Cardiology | Admitting: Cardiology

## 2022-03-12 ENCOUNTER — Encounter: Payer: Self-pay | Admitting: Cardiology

## 2022-03-12 VITALS — BP 132/78 | HR 78 | Ht 65.0 in | Wt 155.6 lb

## 2022-03-12 DIAGNOSIS — R002 Palpitations: Secondary | ICD-10-CM | POA: Diagnosis not present

## 2022-03-12 DIAGNOSIS — I2510 Atherosclerotic heart disease of native coronary artery without angina pectoris: Secondary | ICD-10-CM | POA: Diagnosis not present

## 2022-03-12 DIAGNOSIS — E7800 Pure hypercholesterolemia, unspecified: Secondary | ICD-10-CM

## 2022-03-12 NOTE — Patient Instructions (Signed)
    Follow-Up: At [facility], you and your health needs are our priority.  As part of our continuing mission to provide you with exceptional heart care, we have created designated Provider Care Teams.  These Care Teams include your primary Cardiologist (physician) and Advanced Practice Providers (APPs -  Physician Assistants and Nurse Practitioners) who all work together to provide you with the care you need, when you need it.  We recommend signing up for the patient portal called "MyChart".  Sign up information is provided on this After Visit Summary.  MyChart is used to connect with patients for Virtual Visits (Telemedicine).  Patients are able to view lab/test results, encounter notes, upcoming appointments, etc.  Non-urgent messages can be sent to your provider as well.   To learn more about what you can do with MyChart, go to [email or web address].    Your next appointment:   As needed        

## 2022-03-13 LAB — HIV-1 RNA QUANT-NO REFLEX-BLD
HIV 1 RNA Quant: NOT DETECTED Copies/mL
HIV-1 RNA Quant, Log: NOT DETECTED Log cps/mL

## 2022-03-13 LAB — T-HELPER CELLS (CD4) COUNT (NOT AT ARMC)
Absolute CD4: 139 cells/uL — ABNORMAL LOW (ref 490–1740)
CD4 T Helper %: 14 % — ABNORMAL LOW (ref 30–61)
Total lymphocyte count: 989 cells/uL (ref 850–3900)

## 2022-03-15 ENCOUNTER — Other Ambulatory Visit: Payer: Self-pay | Admitting: Infectious Diseases

## 2022-03-15 DIAGNOSIS — B20 Human immunodeficiency virus [HIV] disease: Secondary | ICD-10-CM

## 2022-03-22 ENCOUNTER — Ambulatory Visit: Payer: BC Managed Care – PPO | Admitting: Obstetrics and Gynecology

## 2022-04-04 ENCOUNTER — Other Ambulatory Visit (HOSPITAL_COMMUNITY)
Admission: RE | Admit: 2022-04-04 | Discharge: 2022-04-04 | Disposition: A | Discharge status: Home / Self Care | Payer: Medicaid Other | Source: Ambulatory Visit | Attending: Obstetrics and Gynecology | Admitting: Obstetrics and Gynecology

## 2022-04-04 ENCOUNTER — Encounter: Payer: Self-pay | Admitting: Obstetrics and Gynecology

## 2022-04-04 ENCOUNTER — Ambulatory Visit (INDEPENDENT_AMBULATORY_CARE_PROVIDER_SITE_OTHER): Payer: Medicaid Other | Admitting: Obstetrics and Gynecology

## 2022-04-04 VITALS — BP 113/74 | HR 93 | Ht 65.0 in | Wt 153.8 lb

## 2022-04-04 DIAGNOSIS — Z01419 Encounter for gynecological examination (general) (routine) without abnormal findings: Secondary | ICD-10-CM | POA: Diagnosis not present

## 2022-04-04 DIAGNOSIS — R87610 Atypical squamous cells of undetermined significance on cytologic smear of cervix (ASC-US): Secondary | ICD-10-CM

## 2022-04-04 NOTE — Progress Notes (Signed)
Patient presents for AEX. Last Pap: 02/01/21 Last Mammogram: 06/15/21 Vaginal/Urinary Symptoms: Has concern for HPV lesion Other Concerns: Wants to discuss progesterone and estrogen medications to be prescribed here.

## 2022-04-04 NOTE — Progress Notes (Signed)
Stacy Watkins is a 62 y.o. [phone] female here for a routine annual gynecologic exam.  Current complaints: NA.   Denies abnormal vaginal bleeding, discharge, pelvic pain, problems with intercourse or other gynecologic concerns.    Gynecologic History Patient's last menstrual period was 09/24/2012. Contraception: post menopausal status Last Pap: 2023. Results were: abnormal ASCUS, HPV negative Last mammogram: 2023. Results were: normal  Obstetric History OB History  Gravida Para Term Preterm AB Living  3 2     1 2   SAB IAB Ectopic Multiple Live Births               # Outcome Date GA Lbr Len/2nd Weight Sex Delivery Anes PTL Lv  3 Para           2 Para           1 AB             Past Medical History:  Diagnosis Date   Anxiety    Depression    Depression with anxiety    HIV disease 06/08/2019   Hyperlipidemia    Hypertension    Migraine    PMB (postmenopausal bleeding)    Seasonal allergies    Situational stress    Vaginal Pap smear, abnormal    Wears glasses     Past Surgical History:  Procedure Laterality Date   CARPAL TUNNEL RELEASE Right 2016   CARPAL TUNNEL RELEASE Left 10/11/2014   Procedure: LEFT  ENDOSCOPIC CARPAL TUNNEL RELEASE;  Surgeon: Milly Jakob, MD;  Location: [place];  Service: Orthopedics;  Laterality: Left;   CESAREAN SECTION     CHOLECYSTECTOMY  2017   LAPAROSCOPIC   COLONSCOPY  LAST DONE 2019   DILATATION & CURETTAGE/HYSTEROSCOPY WITH MYOSURE N/A 05/20/2020   Procedure: DILATATION & CURETTAGE/HYSTEROSCOPY;  Surgeon: Everlene Farrier, MD;  Location: [facility];  Service: Gynecology;  Laterality: N/A;   DILATION AND CURETTAGE OF UTERUS     X 1   EYE SURGERY  AGE 43   TRIGGER FINGER RELEASE Right 12/09/2017   Procedure: RELEASE TRIGGER FINGER/A-1 PULLEY;  Surgeon: Milly Jakob, MD;  Location: [place];  Service: Orthopedics;  Laterality: Right;    Current Outpatient Medications on File Prior to  Visit  Medication Sig Dispense Refill   ALPRAZolam (XANAX) 0.5 MG tablet TAKE 1/2   1 TABLET TWICE A DAY AS NEEDED     amLODipine (NORVASC) 2.5 MG tablet daily.     aspirin 81 MG tablet Take 81 mg by mouth daily.     atenolol (TENORMIN) 50 MG tablet Take 1 tablet (50 mg total) by mouth daily. 90 tablet 1   CALCIUM PO Take by mouth every evening.     Cholecalciferol (VITAMIN D PO) Take by mouth every evening.     clonazePAM (KLONOPIN) 0.5 MG tablet Take 0.5 mg by mouth every evening.     DOVATO 50-300 MG tablet TAKE 1 TABLET BY MOUTH EVERY DAY 30 tablet 11   estradiol (ESTRACE) 2 MG tablet Take 2 mg by mouth daily.     Galcanezumab-gnlm (EMGALITY) 120 MG/ML SOAJ Inject 120 mg into the skin every 30 (thirty) days. 1.12 mL 11   imiquimod (ALDARA) 5 % cream Apply topically 3 (three) times a week. Apply until total clearance or maximum of 16 weeks 24 each 5   Melatonin 10 MG CAPS See admin instructions.     naproxen (NAPROSYN) 500 MG tablet Take 500 mg by mouth 2 (two)  times daily as needed.     nortriptyline (PAMELOR) 50 MG capsule TAKE 1 CAPSULE BY MOUTH EVERYDAY AT BEDTIME 90 capsule 1   pantoprazole (PROTONIX) 40 MG tablet Take 1 tablet (40 mg total) by mouth daily before breakfast. 90 tablet 3   Probiotic Product (ALIGN PO) Take 1 capsule by mouth 2 (two) times daily. PM AND QHS  PROBIOTIC 25 BILLION PER STRAIN     progesterone (PROMETRIUM) 200 MG capsule Take 200 mg by mouth at bedtime.     rizatriptan (MAXALT) 10 MG tablet Take 1 tablet (10 mg total) by mouth as needed for migraine. May repeat in 2 hours if needed 10 tablet 6   rosuvastatin (CRESTOR) 10 MG tablet TAKE 1 TABLET BY MOUTH EVERY DAY 90 tablet 3   sulfamethoxazole-trimethoprim (BACTRIM) 400-80 MG tablet TAKE 1 TABLET BY MOUTH EVERY DAY 30 tablet 3   zolpidem (AMBIEN) 10 MG tablet Take 5-10 mg by mouth at bedtime as needed.     Current Facility-Administered Medications on File Prior to Visit  Medication Dose Route Frequency  Provider Last Rate Last Admin   albuterol (VENTOLIN HFA) 108 (90 Base) MCG/ACT inhaler 2 puff  2 puff Inhalation Once PRN [place] Callas, NP       diphenhydrAMINE (BENADRYL) injection 50 mg  50 mg Intramuscular Once PRN [place] Callas, NP       EPINEPHrine (EPI-PEN) injection 0.3 mg  0.3 mg Intramuscular Once PRN [place] Callas, NP        Allergies  Allergen Reactions   Dust Mite Extract Other (See Comments)   Mold Extract  [Trichophyton] Other (See Comments)   Trazodone Hcl Other (See Comments)    NOT SURE REACTION    Social History   Socioeconomic History   Marital status: Divorced    Spouse name: Not on file   Number of children: 5   Years of education: 16   Highest education level: Not on file  Occupational History   Not on file  Tobacco Use   Smoking status: Never   Smokeless tobacco: Never  Vaping Use   Vaping Use: Never used  Substance and Sexual Activity   Alcohol use: No   Drug use: No   Sexual activity: Not Currently    Comment: declined condoms  Other Topics Concern   Not on file  Social History Narrative   Lives with husband in a one story home.  Has 5 children (2 biological and 3 adopted).  Works as a Probation officer.  Education: college.    Social Determinants of Health   Financial Resource Strain: Not on file  Food Insecurity: Not on file  Transportation Needs: Not on file  Physical Activity: Not on file  Stress: Not on file  Social Connections: Not on file  Intimate Partner Violence: Not on file    Family History  Problem Relation Age of Onset   Heart failure Mother    Cancer Father    CAD Father        MI in his 88s   Heart attack Father    Prostate cancer Father 72   Colon cancer Father 46   CAD Sister        Died at age 80 of MI   Heart attack Sister    Hypertension Brother    Migraines Neg Hx    Esophageal cancer Neg Hx    Rectal cancer Neg Hx    Stomach cancer Neg Hx     The following portions  of the patient's history were  reviewed and updated as appropriate: allergies, current medications, past family history, past medical history, past social history, past surgical history and problem list.  Review of Systems Pertinent items noted in HPI and remainder of comprehensive ROS otherwise negative.   Objective:  BP 113/74   Pulse 93   Ht 5\' 5"  (1.651 m)   Wt 153 lb 12.8 oz (69.8 kg)   LMP 09/24/2012   BMI 25.59 kg/m  Chaperone present CONSTITUTIONAL: Well-developed, well-nourished female in no acute distress.  HENT:  Normocephalic, atraumatic, External right and left ear normal. Oropharynx is clear and moist EYES: Conjunctivae and EOM are normal. Pupils are equal, round, and reactive to light. No scleral icterus.  NECK: Normal range of motion, supple, no masses.  Normal thyroid.  SKIN: Skin is warm and dry. No rash noted. Not diaphoretic. No erythema. No pallor. [place]: Alert and oriented to person, place, and time. Normal reflexes, muscle tone coordination. No cranial nerve deficit noted. PSYCHIATRIC: Normal mood and affect. Normal behavior. Normal judgment and thought content. CARDIOVASCULAR: Normal heart rate noted, regular rhythm RESPIRATORY: Clear to auscultation bilaterally. Effort and breath sounds normal, no problems with respiration noted. BREASTS: Symmetric in size. No masses, skin changes, nipple drainage, or lymphadenopathy. ABDOMEN: Soft, normal bowel sounds, no distention noted.  No tenderness, rebound or guarding.  PELVIC: Normal appearing external genitalia; normal appearing vaginal mucosa and cervix.  No abnormal discharge noted.  Pap smear obtained.  Normal uterine size, no other palpable masses, no uterine or adnexal tenderness. 2 small warts noted and Tx with TCA. Pt tolerated well MUSCULOSKELETAL: Normal range of motion. No tenderness.  No cyanosis, clubbing, or edema.  2+ distal pulses.   Assessment:  Annual gynecologic examination with pap smear   Plan:  Will follow up results of  pap smear and manage accordingly. Mammogram scheduled Routine preventative health maintenance measures emphasized. Please refer to After Visit Summary for other counseling recommendations.    Chancy Milroy, MD, [place] Attending [place] for [facility], [place]

## 2022-04-09 LAB — CYTOLOGY - PAP
Comment: NEGATIVE
Diagnosis: UNDETERMINED — AB
High risk HPV: NEGATIVE

## 2022-05-04 ENCOUNTER — Other Ambulatory Visit: Payer: Self-pay | Admitting: Infectious Diseases

## 2022-05-04 DIAGNOSIS — B20 Human immunodeficiency virus [HIV] disease: Secondary | ICD-10-CM

## 2022-06-01 DIAGNOSIS — Z21 Asymptomatic human immunodeficiency virus [HIV] infection status: Secondary | ICD-10-CM

## 2022-06-06 ENCOUNTER — Other Ambulatory Visit: Payer: Medicaid Other

## 2022-06-06 ENCOUNTER — Other Ambulatory Visit: Payer: Self-pay

## 2022-06-06 DIAGNOSIS — Z21 Asymptomatic human immunodeficiency virus [HIV] infection status: Secondary | ICD-10-CM

## 2022-06-07 LAB — T-HELPER CELL (CD4) - (RCID CLINIC ONLY)
CD4 % Helper T Cell: 13 % — ABNORMAL LOW (ref 33–65)
CD4 T Cell Abs: 118 /uL — ABNORMAL LOW (ref 400–1790)

## 2022-06-09 LAB — HIV-1 RNA QUANT-NO REFLEX-BLD
HIV 1 RNA Quant: <20 Copies/mL — ABNORMAL HIGH
HIV-1 RNA Quant, Log: <1.3 Log cps/mL — ABNORMAL HIGH

## 2022-06-18 ENCOUNTER — Ambulatory Visit
Admission: RE | Admit: 2022-06-18 | Discharge: 2022-06-18 | Disposition: A | Discharge status: Home / Self Care | Payer: Medicaid Other | Source: Ambulatory Visit | Attending: Obstetrics and Gynecology | Admitting: Obstetrics and Gynecology

## 2022-06-18 DIAGNOSIS — Z01419 Encounter for gynecological examination (general) (routine) without abnormal findings: Secondary | ICD-10-CM

## 2022-08-13 ENCOUNTER — Other Ambulatory Visit: Payer: Self-pay | Admitting: Neurology

## 2022-08-23 ENCOUNTER — Other Ambulatory Visit: Payer: Medicaid Other

## 2022-08-23 ENCOUNTER — Other Ambulatory Visit: Payer: Self-pay

## 2022-08-23 DIAGNOSIS — B20 Human immunodeficiency virus [HIV] disease: Secondary | ICD-10-CM

## 2022-08-23 DIAGNOSIS — Z79899 Other long term (current) drug therapy: Secondary | ICD-10-CM

## 2022-08-24 LAB — T-HELPER CELL (CD4) - (RCID CLINIC ONLY)
CD4 % Helper T Cell: 15 % — ABNORMAL LOW (ref 33–65)
CD4 T Cell Abs: 128 /uL — ABNORMAL LOW (ref 400–1790)

## 2022-08-25 LAB — CBC WITH DIFFERENTIAL/PLATELET
Absolute Monocytes: 209 {cells}/uL (ref 200–950)
Basophils Absolute: 49 {cells}/uL (ref 0–200)
Basophils Relative: 1.2 %
Eosinophils Absolute: 250 {cells}/uL (ref 15–500)
Eosinophils Relative: 6.1 %
HCT: 42.5 % (ref 35.0–45.0)
Hemoglobin: 14.8 g/dL (ref 11.7–15.5)
Lymphs Abs: 972 {cells}/uL (ref 850–3900)
MCH: 31.2 pg (ref 27.0–33.0)
MCHC: 34.8 g/dL (ref 32.0–36.0)
MCV: 89.7 fL (ref 80.0–100.0)
MPV: 10 fL (ref 7.5–12.5)
Monocytes Relative: 5.1 %
Neutro Abs: 2620 {cells}/uL (ref 1500–7800)
Neutrophils Relative %: 63.9 %
Platelets: 241 10*3/uL (ref 140–400)
RBC: 4.74 10*6/uL (ref 3.80–5.10)
RDW: 12.3 % (ref 11.0–15.0)
Total Lymphocyte: 23.7 %
WBC: 4.1 10*3/uL (ref 3.8–10.8)

## 2022-08-25 LAB — LIPID PANEL
Cholesterol: 150 mg/dL (ref <200)
HDL: 55 mg/dL (ref >=50)
LDL Cholesterol (Calc): 75 mg/dL
Non-HDL Cholesterol (Calc): 95 mg/dL (ref <130)
Total CHOL/HDL Ratio: 2.7 (calc) (ref <5.0)
Triglycerides: 117 mg/dL (ref <150)

## 2022-08-25 LAB — COMPLETE METABOLIC PANEL WITH GFR
AG Ratio: 1.8 (calc) (ref 1.0–2.5)
ALT: 20 U/L (ref 6–29)
AST: 21 U/L (ref 10–35)
Albumin: 4.2 g/dL (ref 3.6–5.1)
Alkaline phosphatase (APISO): 60 U/L (ref 37–153)
BUN: 22 mg/dL (ref 7–25)
CO2: 29 mmol/L (ref 20–32)
Calcium: 8.9 mg/dL (ref 8.6–10.4)
Chloride: 105 mmol/L (ref 98–110)
Creat: 0.92 mg/dL (ref 0.50–1.05)
Globulin: 2.4 g/dL (ref 1.9–3.7)
Glucose, Bld: 107 mg/dL — ABNORMAL HIGH (ref 65–99)
Potassium: 3.9 mmol/L (ref 3.5–5.3)
Sodium: 140 mmol/L (ref 135–146)
Total Bilirubin: 0.3 mg/dL (ref 0.2–1.2)
Total Protein: 6.6 g/dL (ref 6.1–8.1)
eGFR: 70 mL/min/{1.73_m2} (ref >=60)

## 2022-08-25 LAB — HIV-1 RNA QUANT-NO REFLEX-BLD
HIV 1 RNA Quant: NOT DETECTED {copies}/mL
HIV-1 RNA Quant, Log: NOT DETECTED {Log_copies}/mL

## 2022-09-17 ENCOUNTER — Encounter: Payer: Self-pay | Admitting: Neurology

## 2022-10-03 ENCOUNTER — Encounter: Payer: Self-pay | Admitting: Neurology

## 2022-10-03 MED ORDER — EMGALITY 120 MG/ML ~~LOC~~ SOAJ: 120.0000 mg | SUBCUTANEOUS | 0 refills | Status: AC

## 2022-10-03 NOTE — Addendum Note (Signed)
Addended by: Danne Harbor on: 10/03/2022 05:15 PM   Modules accepted: Orders

## 2023-02-07 ENCOUNTER — Other Ambulatory Visit (HOSPITAL_COMMUNITY): Payer: Self-pay

## 2023-03-19 ENCOUNTER — Other Ambulatory Visit: Payer: Self-pay | Admitting: Infectious Diseases

## 2023-03-19 DIAGNOSIS — B20 Human immunodeficiency virus [HIV] disease: Secondary | ICD-10-CM

## 2023-03-19 NOTE — Telephone Encounter (Signed)
 Patient has new ID provider:  Eloy End, MD  [address]  [place], [place] [address]  [phone] (Work)  [phone] (Fax)
